# Patient Record
Sex: Male | Born: 1960 | Race: White | Hispanic: Yes | Marital: Married | Smoking: Never smoker
Health system: Southern US, Community
[De-identification: ages and names within clinical notes are randomized; demographics above are authoritative.]

## PROBLEM LIST (undated history)

## (undated) DIAGNOSIS — T7840XA Allergy, unspecified, initial encounter: Secondary | ICD-10-CM

## (undated) DIAGNOSIS — R42 Dizziness and giddiness: Secondary | ICD-10-CM

## (undated) DIAGNOSIS — I82409 Acute embolism and thrombosis of unspecified deep veins of unspecified lower extremity: Secondary | ICD-10-CM

## (undated) DIAGNOSIS — R002 Palpitations: Secondary | ICD-10-CM

## (undated) HISTORY — DX: Palpitations: R00.2

## (undated) HISTORY — DX: Allergy, unspecified, initial encounter: T78.40XA

## (undated) HISTORY — DX: Dizziness and giddiness: R42

## (undated) HISTORY — DX: Acute embolism and thrombosis of unspecified deep veins of unspecified lower extremity: I82.409

---

## 2012-10-25 HISTORY — PX: POLYPECTOMY: SHX149

## 2015-10-14 ENCOUNTER — Encounter: Payer: Self-pay | Admitting: Physician Assistant

## 2015-10-14 ENCOUNTER — Ambulatory Visit: Payer: Self-pay | Admitting: Physician Assistant

## 2015-10-14 VITALS — BP 118/76 | HR 56 | Temp 97.7°F | Ht 69.0 in | Wt 163.0 lb

## 2015-10-14 DIAGNOSIS — R002 Palpitations: Secondary | ICD-10-CM

## 2015-10-14 DIAGNOSIS — Z131 Encounter for screening for diabetes mellitus: Secondary | ICD-10-CM

## 2015-10-14 DIAGNOSIS — Z125 Encounter for screening for malignant neoplasm of prostate: Secondary | ICD-10-CM

## 2015-10-14 DIAGNOSIS — Z13220 Encounter for screening for lipoid disorders: Secondary | ICD-10-CM

## 2015-10-14 LAB — GLUCOSE, POCT (MANUAL RESULT ENTRY): POC Glucose: 91 mg/dl (ref 70–99)

## 2015-10-14 NOTE — Progress Notes (Signed)
BP 118/76 (BP Location: Left Arm, Patient Position: Sitting, Cuff Size: Normal)   Pulse (!) 56   Temp 97.7 F (36.5 C)   Ht 5\' 9"  (1.753 m)   Wt 163 lb (73.9 kg)   SpO2 97%   BMI 24.07 kg/m    Subjective:    Patient ID: Logan Bush, male    DOB: [birth year], 56 y.o.   MRN: [id number]  HPI: Logan Bush is a 55 y.o. male presenting on 10/14/2015 for New Patient (Initial Visit) (pt arrived from [place] 6 months)   HPI   Pt recently moved from [place]. Moved due to the stress.  Originally from [place].  Pt says he had on Oct 2014- colonoscopy with polypectomy at [facility].    Pt c/o palpitations.  once at 2am last week and other was about 4 months ago.  Says lasted about 30 minutes.  No CP just felt somewhat anxious.  Relevant past medical, surgical, family and social history reviewed and updated as indicated. Interim medical history since our last visit reviewed. Allergies and medications reviewed and updated.   Current Outpatient Prescriptions:  .  Omega-3 Fatty Acids (FISH OIL PO), Take 1-4 capsules by mouth daily., Disp: , Rfl:  .  Probiotic Product (PROBIOTIC PO), Take 2 capsules by mouth daily., Disp: , Rfl:  .  Psyllium (METAMUCIL FIBER PO), Take by mouth., Disp: , Rfl:    Review of Systems  Constitutional: Positive for fatigue. Negative for appetite change, chills, diaphoresis, fever and unexpected weight change.  HENT: Positive for dental problem and sneezing. Negative for congestion, drooling, ear pain, facial swelling, hearing loss, mouth sores, sore throat, trouble swallowing and voice change.   Eyes: Negative for pain, discharge, redness, itching and visual disturbance.  Respiratory: Negative for cough, choking, shortness of breath and wheezing.   Cardiovascular: Positive for palpitations. Negative for chest pain and leg swelling.  Gastrointestinal: Positive for diarrhea. Negative for abdominal pain, blood in stool, constipation and vomiting.  Endocrine: Positive  for heat intolerance. Negative for cold intolerance and polydipsia.  Genitourinary: Negative for decreased urine volume, dysuria and hematuria.  Musculoskeletal: Positive for arthralgias, back pain and gait problem.  Skin: Negative for rash.  Allergic/Immunologic: Positive for environmental allergies.  Neurological: Positive for headaches. Negative for seizures, syncope and light-headedness.  Hematological: Negative for adenopathy.  Psychiatric/Behavioral: Positive for agitation. Negative for dysphoric mood and suicidal ideas. The patient is not nervous/anxious.     Per HPI unless specifically indicated above     Objective:    BP 118/76 (BP Location: Left Arm, Patient Position: Sitting, Cuff Size: Normal)   Pulse (!) 56   Temp 97.7 F (36.5 C)   Ht 5\' 9"  (1.753 m)   Wt 163 lb (73.9 kg)   SpO2 97%   BMI 24.07 kg/m   Wt Readings from Last 3 Encounters:  10/14/15 163 lb (73.9 kg)    Physical Exam  Constitutional: He is oriented to person, place, and time. He appears well-developed and well-nourished.  HENT:  Head: Normocephalic and atraumatic.  Mouth/Throat: Oropharynx is clear and moist. No oropharyngeal exudate.  Eyes: Conjunctivae and EOM are normal. Pupils are equal, round, and reactive to light.  Neck: Neck supple. No thyromegaly present.  Cardiovascular: Normal rate and regular rhythm.   Pulmonary/Chest: Effort normal and breath sounds normal. He has no wheezes. He has no rales.  Abdominal: Soft. Bowel sounds are normal. He exhibits no mass. There is no hepatosplenomegaly. There is no tenderness.  Musculoskeletal:  He exhibits no edema.  Lymphadenopathy:    He has no cervical adenopathy.  Neurological: He is alert and oriented to person, place, and time.  Skin: Skin is warm and dry. No rash noted.  Psychiatric: He has a normal mood and affect. His behavior is normal. Thought content normal.  Vitals reviewed.    EKG- NSR at 57 bpm- no st-t changes. No previous for  comparison   Results for orders placed or performed in visit on 10/14/15  POCT Glucose (CBG)  Result Value Ref Range   POC Glucose 91 70 - 99 mg/dl      Assessment & Plan:   Encounter Diagnoses  Name Primary?  . Palpitations Yes  . Screening for diabetes mellitus (DM)   . Screening cholesterol level   . Screening for prostate cancer      -Get baseline labs -[facility] -pt counseled to avoid caffeine -F/u 1 month.  RTO sooner prn

## 2015-10-16 LAB — CBC WITH DIFFERENTIAL/PLATELET
BASOS PCT: 1 %
Basophils Absolute: 37 cells/uL (ref 0–200)
Eosinophils Absolute: 222 cells/uL (ref 15–500)
Eosinophils Relative: 6 %
HCT: 40.8 % (ref 38.5–50.0)
Hemoglobin: 13.9 g/dL (ref 13.2–17.1)
LYMPHS PCT: 37 %
Lymphs Abs: 1369 cells/uL (ref 850–3900)
MCH: 30.5 pg (ref 27.0–33.0)
MCHC: 34.1 g/dL (ref 32.0–36.0)
MCV: 89.5 fL (ref 80.0–100.0)
MONO ABS: 407 {cells}/uL (ref 200–950)
MONOS PCT: 11 %
MPV: 10.3 fL (ref 7.5–12.5)
Neutro Abs: 1665 cells/uL (ref 1500–7800)
Neutrophils Relative %: 45 %
PLATELETS: 210 10*3/uL (ref 140–400)
RBC: 4.56 MIL/uL (ref 4.20–5.80)
RDW: 13.5 % (ref 11.0–15.0)
WBC: 3.7 10*3/uL — AB (ref 3.8–10.8)

## 2015-10-16 LAB — LIPID PANEL
CHOLESTEROL: 183 mg/dL (ref 125–200)
HDL: 65 mg/dL (ref >=40)
LDL Cholesterol: 108 mg/dL (ref <130)
TRIGLYCERIDES: 52 mg/dL (ref <150)
Total CHOL/HDL Ratio: 2.8 Ratio (ref <=5.0)
VLDL: 10 mg/dL (ref <30)

## 2015-10-16 LAB — PSA: PSA: 0.5 ng/mL (ref <=4.0)

## 2015-10-16 LAB — COMPREHENSIVE METABOLIC PANEL
ALBUMIN: 4.3 g/dL (ref 3.6–5.1)
ALT: 14 U/L (ref 9–46)
AST: 14 U/L (ref 10–35)
Alkaline Phosphatase: 52 U/L (ref 40–115)
BILIRUBIN TOTAL: 0.9 mg/dL (ref 0.2–1.2)
BUN: 16 mg/dL (ref 7–25)
CHLORIDE: 105 mmol/L (ref 98–110)
CO2: 29 mmol/L (ref 20–31)
CREATININE: 0.97 mg/dL (ref 0.70–1.33)
Calcium: 9 mg/dL (ref 8.6–10.3)
Glucose, Bld: 92 mg/dL (ref 65–99)
Potassium: 4.6 mmol/L (ref 3.5–5.3)
SODIUM: 141 mmol/L (ref 135–146)
TOTAL PROTEIN: 6.4 g/dL (ref 6.1–8.1)

## 2015-10-16 LAB — TSH: TSH: 0.71 mIU/L (ref 0.40–4.50)

## 2015-10-17 LAB — HEMOGLOBIN A1C
Hgb A1c MFr Bld: 5.2 % (ref <5.7)
MEAN PLASMA GLUCOSE: 103 mg/dL

## 2015-10-27 ENCOUNTER — Ambulatory Visit: Payer: Self-pay

## 2015-11-11 ENCOUNTER — Ambulatory Visit: Payer: Self-pay | Admitting: Physician Assistant

## 2015-11-11 ENCOUNTER — Encounter: Payer: Self-pay | Admitting: Physician Assistant

## 2015-11-11 VITALS — BP 126/88 | HR 52 | Temp 97.9°F | Ht 69.0 in | Wt 164.0 lb

## 2015-11-11 DIAGNOSIS — R0602 Shortness of breath: Secondary | ICD-10-CM

## 2015-11-11 NOTE — Progress Notes (Signed)
BP 126/88 (BP Location: Left Arm, Patient Position: Sitting, Cuff Size: Normal)   Pulse (!) 52   Temp 97.9 F (36.6 C)   Ht _0  (1.753 m)   Wt 164 lb (74.4 kg)   SpO2 98%   BMI 24.22 kg/m    Subjective:    Patient ID: Logan Bush, male    DOB: [birth year], 55 y.o.   MRN: [id number]  HPI: Logan Bush is a 54 y.o. male presenting on 11/11/2015 for Follow-up   HPI   Pt didn't cut out caffeine after last OV as recommended.   Pt c/o sometimes he gets cp and sometimes he gets sob.  He gets sob this when he is exerting himself.   He gets chest pain at rest.  It usually lasts about 2 minutes. In march he had an episode that lasted 30 minutes.  He got dizzy with that episode also.    When he stands up he feels like his heart is beating faster.   He also says he gets sob and cp together.  And when he lays down he feels better.  Somewhat difficult historian even with a Optometrist.   He gets sob when he climbs stairs but says he doesn't get cp.  The pain he gets when he is sitting at rest is sharp and lasts a couple of seconds, like 30-40 seconds- these are the stronger pains. The other ones are lasting for several minutes as above.   Pt plays soccer one time each month for 1 1/2 hours.  He gets no sob or chest pains.  He only gets pains in his knees.  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  He says he does get sob when he has to bend over and pick up the ball and throw it.    Pt worried b/c his mother died from lack of oxygen to the brain.  He admits on questioning she had a stroke at age 4.   Relevant past medical, surgical, family and social history reviewed and updated as indicated. Interim medical history since our last visit reviewed. Allergies and medications reviewed and updated.   Current Outpatient Prescriptions:  .  Psyllium (METAMUCIL FIBER PO), Take by mouth., Disp: , Rfl:  .  Omega-3 Fatty Acids (FISH OIL PO), Take 1-4 capsules by mouth daily., Disp: ,  Rfl:  .  Probiotic Product (PROBIOTIC PO), Take 2 capsules by mouth daily., Disp: , Rfl:    Review of Systems  Constitutional: Positive for fatigue. Negative for appetite change, chills, diaphoresis, fever and unexpected weight change.  HENT: Positive for dental problem. Negative for congestion, drooling, ear pain, facial swelling, hearing loss, mouth sores, sneezing, sore throat, trouble swallowing and voice change.   Eyes: Positive for itching. Negative for pain, discharge, redness and visual disturbance.  Respiratory: Positive for shortness of breath. Negative for cough, choking and wheezing.   Cardiovascular: Positive for chest pain. Negative for palpitations and leg swelling.  Gastrointestinal: Negative for abdominal pain, blood in stool, constipation, diarrhea and vomiting.  Endocrine: Positive for heat intolerance. Negative for cold intolerance and polydipsia.  Genitourinary: Negative for decreased urine volume, dysuria and hematuria.  Musculoskeletal: Positive for arthralgias, back pain and gait problem.  Skin: Negative for rash.  Allergic/Immunologic: Negative for environmental allergies.  Neurological: Negative for seizures, syncope, light-headedness and headaches.  Hematological: Negative for adenopathy.  Psychiatric/Behavioral: Positive for agitation. Negative for dysphoric mood and suicidal ideas. The patient is nervous/anxious.  Per HPI unless specifically indicated above     Objective:    BP 126/88 (BP Location: Left Arm, Patient Position: Sitting, Cuff Size: Normal)   Pulse (!) 52   Temp 97.9 F (36.6 C)   Ht _0  (1.753 m)   Wt 164 lb (74.4 kg)   SpO2 98%   BMI 24.22 kg/m   Wt Readings from Last 3 Encounters:  11/11/15 164 lb (74.4 kg)  10/14/15 163 lb (73.9 kg)    Physical Exam  Constitutional: He is oriented to person, place, and time. He appears well-developed and well-nourished.  HENT:  Head: Normocephalic and atraumatic.  Neck: Neck supple.   Cardiovascular: Normal rate and regular rhythm.   Pulmonary/Chest: Effort normal and breath sounds normal. He has no wheezes.  Abdominal: Soft. Bowel sounds are normal. There is no hepatosplenomegaly. There is no tenderness.  Musculoskeletal: He exhibits no edema.  Lymphadenopathy:    He has no cervical adenopathy.  Neurological: He is alert and oriented to person, place, and time.  Skin: Skin is warm and dry.  Psychiatric: He has a normal mood and affect. His behavior is normal.  Vitals reviewed.   Results for orders placed or performed in visit on 10/14/15  Comprehensive Metabolic Panel (CMET)  Result Value Ref Range   Sodium 141 135 - 146 mmol/L   Potassium 4.6 3.5 - 5.3 mmol/L   Chloride 105 98 - 110 mmol/L   CO2 29 20 - 31 mmol/L   Glucose, Bld 92 65 - 99 mg/dL   BUN 16 7 - 25 mg/dL   Creat 0.97 0.70 - 1.33 mg/dL   Total Bilirubin 0.9 0.2 - 1.2 mg/dL   Alkaline Phosphatase 52 40 - 115 U/L   AST 14 10 - 35 U/L   ALT 14 9 - 46 U/L   Total Protein 6.4 6.1 - 8.1 g/dL   Albumin 4.3 3.6 - 5.1 g/dL   Calcium 9.0 8.6 - 10.3 mg/dL  CBC w/Diff/Platelet  Result Value Ref Range   WBC 3.7 (L) 3.8 - 10.8 K/uL   RBC 4.56 4.20 - 5.80 MIL/uL   Hemoglobin 13.9 13.2 - 17.1 g/dL   HCT 40.8 38.5 - 50.0 %   MCV 89.5 80.0 - 100.0 fL   MCH 30.5 27.0 - 33.0 pg   MCHC 34.1 32.0 - 36.0 g/dL   RDW 13.5 11.0 - 15.0 %   Platelets 210 140 - 400 K/uL   MPV 10.3 7.5 - 12.5 fL   Neutro Abs 1,665 1,500 - 7,800 cells/uL   Lymphs Abs 1,369 850 - 3,900 cells/uL   Monocytes Absolute 407 200 - 950 cells/uL   Eosinophils Absolute 222 15 - 500 cells/uL   Basophils Absolute 37 0 - 200 cells/uL   Neutrophils Relative % 45 %   Lymphocytes Relative 37 %   Monocytes Relative 11 %   Eosinophils Relative 6 %   Basophils Relative 1 %   Smear Review Criteria for review not met   PSA  Result Value Ref Range   PSA 0.5 <=4.0 ng/mL  Lipid Profile  Result Value Ref Range   Cholesterol 183 125 - 200 mg/dL    Triglycerides 52 <150 mg/dL   HDL 65 >=40 mg/dL   Total CHOL/HDL Ratio 2.8 <=5.0 Ratio   VLDL 10 <30 mg/dL   LDL Cholesterol 108 <130 mg/dL  TSH  Result Value Ref Range   TSH 0.71 0.40 - 4.50 mIU/L  HgB A1c  Result Value Ref Range  Hgb A1c MFr Bld 5.2 <5.7 %   Mean Plasma Glucose 103 mg/dL  POCT Glucose (CBG)  Result Value Ref Range   POC Glucose 91 70 - 99 mg/dl      Assessment & Plan:   Encounter Diagnosis  Name Primary?  . SOB (shortness of breath) Yes    -reviewed labs with pt -will Get cxr in light of complaints -Gave [facility] discount application -pt encouraged to Drink plenty fluids -Gradually increase exercise- possible deconditioning -still awaiting records requested after new pt appointment -pt to F/u  3 months.  RTO sooner prn worsening or new symptoms

## 2016-02-11 ENCOUNTER — Ambulatory Visit: Payer: Self-pay | Admitting: Physician Assistant

## 2016-02-18 ENCOUNTER — Ambulatory Visit: Payer: Self-pay | Admitting: Physician Assistant

## 2016-02-19 ENCOUNTER — Encounter: Payer: Self-pay | Admitting: Physician Assistant

## 2016-09-15 ENCOUNTER — Ambulatory Visit: Payer: Self-pay | Admitting: Physician Assistant

## 2016-09-16 ENCOUNTER — Ambulatory Visit: Payer: Self-pay | Admitting: Physician Assistant

## 2016-09-16 ENCOUNTER — Encounter: Payer: Self-pay | Admitting: Physician Assistant

## 2016-09-16 VITALS — BP 102/70 | HR 60 | Temp 98.1°F | Ht 69.0 in | Wt 176.8 lb

## 2016-09-16 DIAGNOSIS — J329 Chronic sinusitis, unspecified: Secondary | ICD-10-CM

## 2016-09-16 DIAGNOSIS — R0602 Shortness of breath: Secondary | ICD-10-CM

## 2016-09-16 DIAGNOSIS — K635 Polyp of colon: Secondary | ICD-10-CM | POA: Insufficient documentation

## 2016-09-16 MED ORDER — AMOXICILLIN 500 MG PO CAPS: ORAL_CAPSULE | ORAL | 0 refills | Status: AC

## 2016-09-16 NOTE — Progress Notes (Signed)
BP 102/70 (BP Location: Left Arm, Patient Position: Sitting, Cuff Size: Normal)   Pulse 60   Temp 98.1 F (36.7 C)   Ht '5\' 9"'$  (1.753 m)   Wt 176 lb 12 oz (80.2 kg)   SpO2 97%   BMI 26.10 kg/m    Subjective:    Patient ID: Logan Bush, male    DOB: 24-Nov-1960, 56 y.o.   MRN: [id number]  HPI: Logan Bush is a 56 y.o. male presenting on 09/16/2016 for Epistaxis and Breathing Problem   HPI   Pt never returned to office .  he Was scheduled to follow up dec 2017  Records arrived from [place], [place]- echo 2016 (normal) and colonoscopy 2014 (polyps and hemorrhoids).  Pt's echo was ordered for complaints of SOB.   Pt states nosebleed for several minutes 4 times on  1 1/2 days 3 week ago on the R side.  No bleeding since.  But now feels light-headed when he bends over.   Sometimes he has right sided headache.   Pt says his breathing has been fast- episodes during the day.  States sometimes with exertion and sometimes at rest.   Pt was recommended to get cxr when here in October but he didn't get it done.   Relevant past medical, surgical, family and social history reviewed and updated as indicated. Interim medical history since our last visit reviewed. Allergies and medications reviewed and updated.    Current Outpatient Prescriptions:  .  Psyllium (METAMUCIL FIBER PO), Take by mouth every other day. , Disp: , Rfl:   Review of Systems  Constitutional: Positive for appetite change, diaphoresis and fatigue. Negative for chills, fever and unexpected weight change.  HENT: Positive for dental problem. Negative for congestion, drooling, ear pain, facial swelling, hearing loss, mouth sores, sneezing, sore throat, trouble swallowing and voice change.   Eyes: Positive for pain and itching. Negative for discharge, redness and visual disturbance.  Respiratory: Positive for choking, chest tightness and shortness of breath. Negative for cough and wheezing.   Cardiovascular: Positive for palpitations and  leg swelling. Negative for chest pain.  Gastrointestinal: Positive for abdominal pain. Negative for blood in stool, constipation, diarrhea and vomiting.  Endocrine: Negative for cold intolerance, heat intolerance and polydipsia.  Genitourinary: Negative for decreased urine volume, dysuria and hematuria.  Musculoskeletal: Positive for arthralgias, back pain and gait problem.  Skin: Negative for rash.  Allergic/Immunologic: Negative for environmental allergies.  Neurological: Positive for light-headedness. Negative for seizures, syncope and headaches.  Hematological: Negative for adenopathy.  Psychiatric/Behavioral: Positive for agitation and dysphoric mood. Negative for suicidal ideas. The patient is nervous/anxious.     Per HPI unless specifically indicated above     Objective:    BP 102/70 (BP Location: Left Arm, Patient Position: Sitting, Cuff Size: Normal)   Pulse 60   Temp 98.1 F (36.7 C)   Ht '5\' 9"'$  (1.753 m)   Wt 176 lb 12 oz (80.2 kg)   SpO2 97%   BMI 26.10 kg/m   Wt Readings from Last 3 Encounters:  09/16/16 176 lb 12 oz (80.2 kg)  11/11/15 164 lb (74.4 kg)  10/14/15 163 lb (73.9 kg)    Physical Exam  Constitutional: He is oriented to person, place, and time. He appears well-developed and well-nourished.  HENT:  Head: Normocephalic and atraumatic.  Right Ear: Hearing, tympanic membrane, external ear and ear canal normal.  Left Ear: Hearing, tympanic membrane and external ear normal. A foreign body (cerumen) is present.  Nose:  Nose normal.  Mouth/Throat: Uvula is midline and oropharynx is clear and moist. No uvula swelling. No oropharyngeal exudate, posterior oropharyngeal edema, posterior oropharyngeal erythema or tonsillar abscesses.  No blood or clot in nose  Neck: Neck supple.  Cardiovascular: Normal rate and regular rhythm.   Pulmonary/Chest: Effort normal and breath sounds normal. He has no wheezes.  Abdominal: Soft. Bowel sounds are normal. There is no  hepatosplenomegaly. There is no tenderness.  Musculoskeletal: He exhibits no edema.  Lymphadenopathy:    He has no cervical adenopathy.  Neurological: He is alert and oriented to person, place, and time.  Skin: Skin is warm and dry.  Psychiatric: He has a normal mood and affect. His behavior is normal.  Vitals reviewed.      Results for orders placed or performed in visit on 10/14/15  Comprehensive Metabolic Panel (CMET)  Result Value Ref Range   Sodium 141 135 - 146 mmol/L   Potassium 4.6 3.5 - 5.3 mmol/L   Chloride 105 98 - 110 mmol/L   CO2 29 20 - 31 mmol/L   Glucose, Bld 92 65 - 99 mg/dL   BUN 16 7 - 25 mg/dL   Creat 0.97 0.70 - 1.33 mg/dL   Total Bilirubin 0.9 0.2 - 1.2 mg/dL   Alkaline Phosphatase 52 40 - 115 U/L   AST 14 10 - 35 U/L   ALT 14 9 - 46 U/L   Total Protein 6.4 6.1 - 8.1 g/dL   Albumin 4.3 3.6 - 5.1 g/dL   Calcium 9.0 8.6 - 10.3 mg/dL  CBC w/Diff/Platelet  Result Value Ref Range   WBC 3.7 (L) 3.8 - 10.8 K/uL   RBC 4.56 4.20 - 5.80 MIL/uL   Hemoglobin 13.9 13.2 - 17.1 g/dL   HCT 40.8 38.5 - 50.0 %   MCV 89.5 80.0 - 100.0 fL   MCH 30.5 27.0 - 33.0 pg   MCHC 34.1 32.0 - 36.0 g/dL   RDW 13.5 11.0 - 15.0 %   Platelets 210 140 - 400 K/uL   MPV 10.3 7.5 - 12.5 fL   Neutro Abs 1,665 1,500 - 7,800 cells/uL   Lymphs Abs 1,369 850 - 3,900 cells/uL   Monocytes Absolute 407 200 - 950 cells/uL   Eosinophils Absolute 222 15 - 500 cells/uL   Basophils Absolute 37 0 - 200 cells/uL   Neutrophils Relative % 45 %   Lymphocytes Relative 37 %   Monocytes Relative 11 %   Eosinophils Relative 6 %   Basophils Relative 1 %   Smear Review Criteria for review not met   PSA  Result Value Ref Range   PSA 0.5 <=4.0 ng/mL  Lipid Profile  Result Value Ref Range   Cholesterol 183 125 - 200 mg/dL   Triglycerides 52 <150 mg/dL   HDL 65 >=40 mg/dL   Total CHOL/HDL Ratio 2.8 <=5.0 Ratio   VLDL 10 <30 mg/dL   LDL Cholesterol 108 <130 mg/dL  TSH  Result Value Ref Range    TSH 0.71 0.40 - 4.50 mIU/L  HgB A1c  Result Value Ref Range   Hgb A1c MFr Bld 5.2 <5.7 %   Mean Plasma Glucose 103 mg/dL  POCT Glucose (CBG)  Result Value Ref Range   POC Glucose 91 70 - 99 mg/dl      Assessment & Plan:   Encounter Diagnoses  Name Primary?  . SOB (shortness of breath) Yes  . Sinusitis, unspecified chronicity, unspecified location   . Polyp of colon, unspecified part of  colon, unspecified type      -Pt due for colonoscopy 2019 due to polyps -rx amoxil for sinusisit -again recommend cxr for pt's longstanding complaint of SBO -Gave [facility] discount application for the cxr -pt to follow up one month. RTO sooner prn

## 2016-10-18 ENCOUNTER — Ambulatory Visit: Payer: Self-pay | Admitting: Physician Assistant

## 2016-10-28 ENCOUNTER — Ambulatory Visit: Payer: Self-pay | Admitting: Physician Assistant

## 2016-11-02 ENCOUNTER — Ambulatory Visit: Payer: Self-pay | Admitting: Physician Assistant

## 2016-11-08 ENCOUNTER — Ambulatory Visit (HOSPITAL_COMMUNITY)
Admission: RE | Admit: 2016-11-08 | Discharge: 2016-11-08 | Disposition: A | Discharge status: Home / Self Care | Payer: Self-pay | Source: Ambulatory Visit | Attending: Physician Assistant | Admitting: Physician Assistant

## 2016-11-08 DIAGNOSIS — R0602 Shortness of breath: Secondary | ICD-10-CM | POA: Insufficient documentation

## 2016-11-08 IMAGING — DX DG CHEST 2V
2 series · 2 of 2 positions shown · non-contrast
Comparison: None.

CLINICAL DATA: Chest pain

EXAM:
CHEST  2 VIEW

[chest pa]
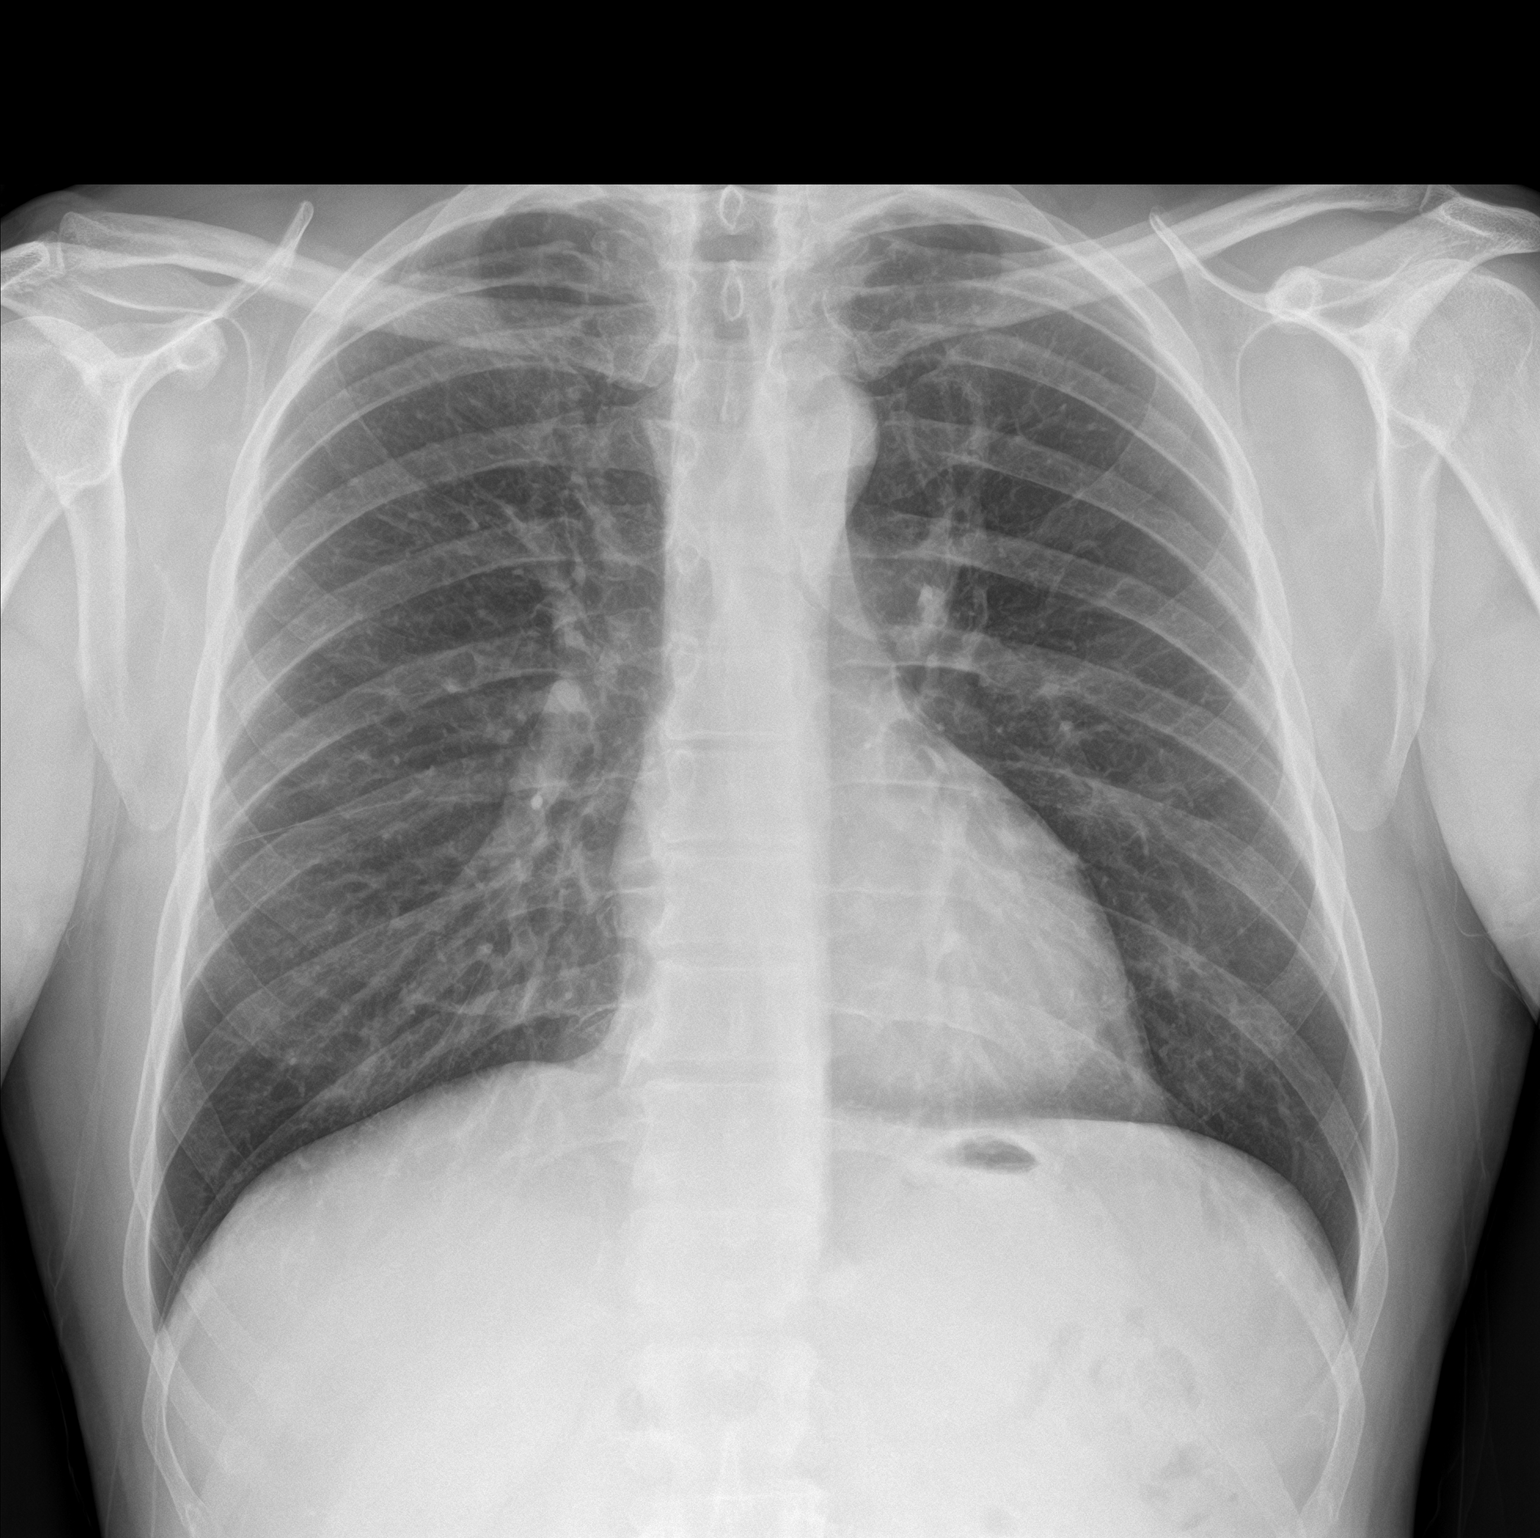

[chest lat]
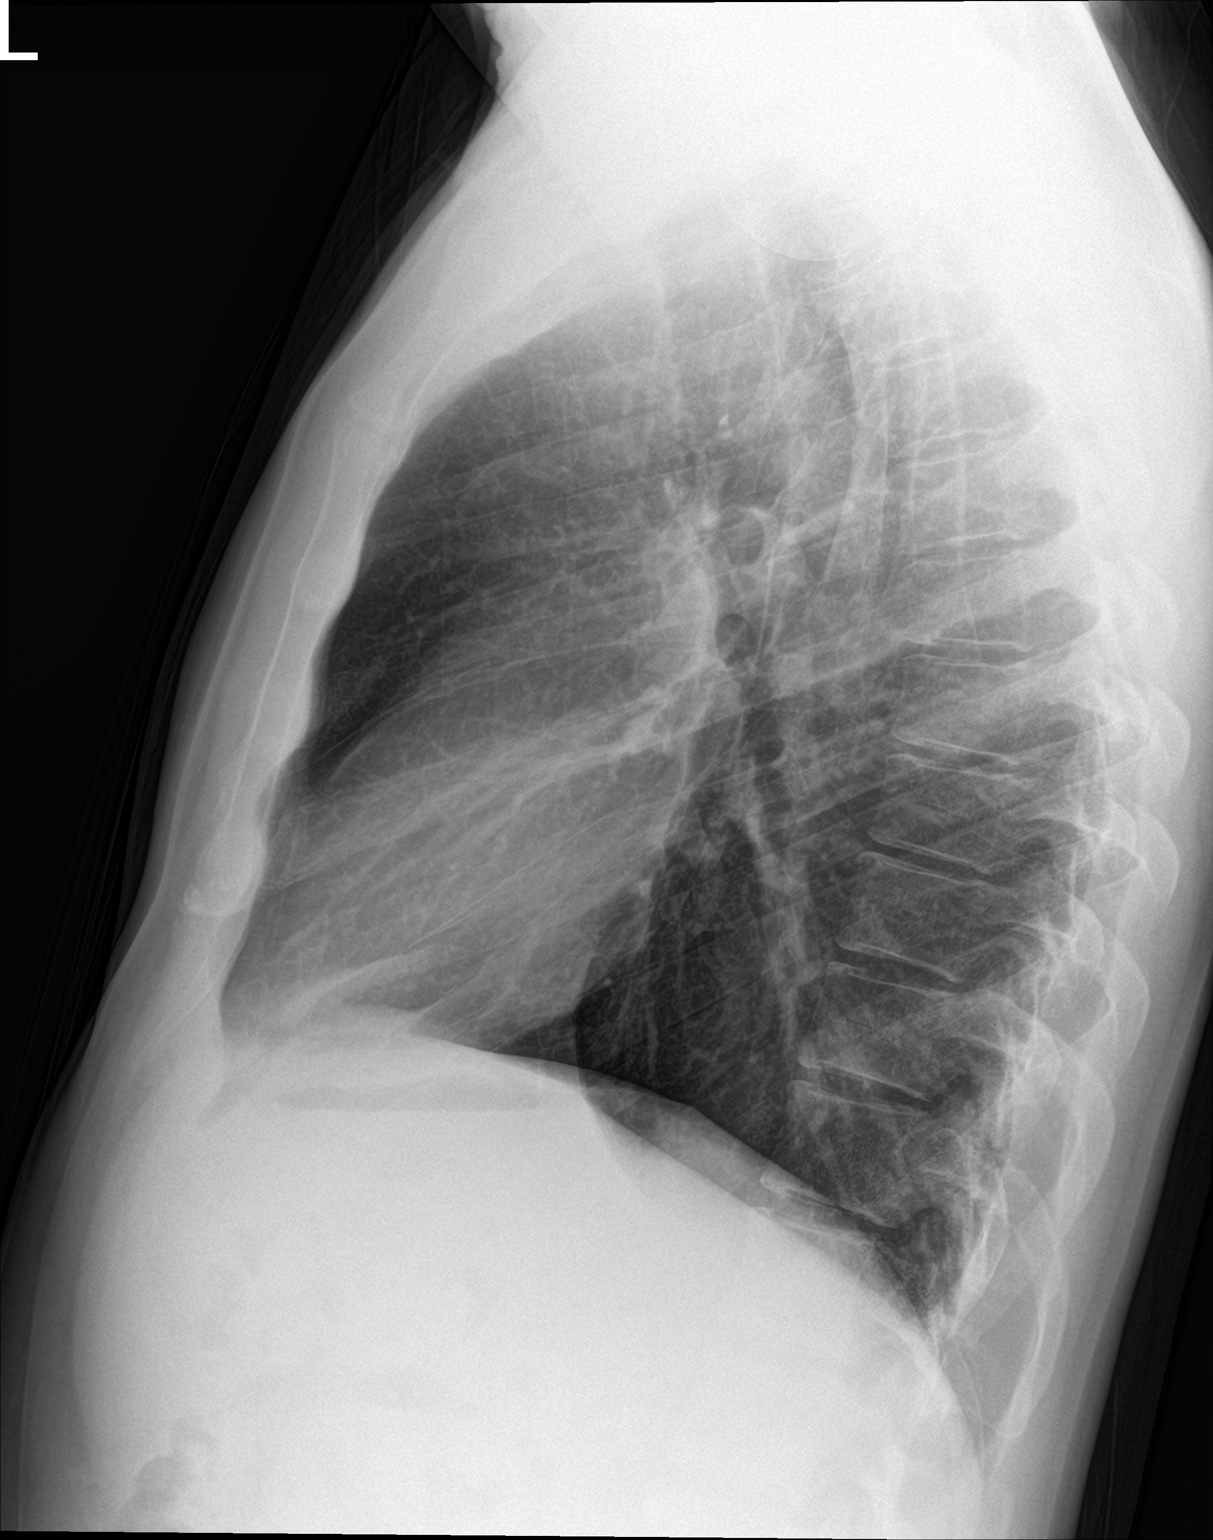

[2 of 2 positions shown; findings below may reference images not displayed]

FINDINGS: Lungs are clear. Heart size and pulmonary vascularity are normal. No
adenopathy. No pneumothorax. No bone lesions.
IMPRESSION: No edema or consolidation.

## 2016-11-09 ENCOUNTER — Ambulatory Visit: Payer: Self-pay | Admitting: Physician Assistant

## 2016-11-18 ENCOUNTER — Ambulatory Visit: Payer: Self-pay | Admitting: Physician Assistant

## 2016-11-18 ENCOUNTER — Encounter: Payer: Self-pay | Admitting: Physician Assistant

## 2016-11-18 VITALS — BP 120/86 | HR 56 | Temp 97.9°F | Ht 69.0 in | Wt 181.0 lb

## 2016-11-18 DIAGNOSIS — R0602 Shortness of breath: Secondary | ICD-10-CM

## 2016-11-18 DIAGNOSIS — Z125 Encounter for screening for malignant neoplasm of prostate: Secondary | ICD-10-CM

## 2016-11-18 DIAGNOSIS — M25562 Pain in left knee: Secondary | ICD-10-CM

## 2016-11-18 DIAGNOSIS — R002 Palpitations: Secondary | ICD-10-CM

## 2016-11-18 MED ORDER — METOPROLOL TARTRATE 25 MG PO TABS: ORAL_TABLET | ORAL | 1 refills | Status: DC

## 2016-11-18 NOTE — Patient Instructions (Signed)
Palpitaciones (Palpitations) Es la sensacin de sentir que el latido cardaco es irregular o es ms rpido que lo normal. Puede sentir como un aleteo o que falta un latido. Generalmente no es un problema grave. Las causas de las palpitaciones pueden ser diversas, entre ellas, el estrs y consumo de cigarrillos, cafena, alcohol y determinados medicamentos. Si bien la Harley-Davidsonmayora de las causas de las palpitaciones no son graves, estas pueden ser un signo de un problema mdico grave. En algunos casos, podra ser necesario hacer ms estudios. INSTRUCCIONES PARA EL CUIDADO EN EL HOGAR Est atento a cualquier cambio en los sntomas. Tome estas medidas para controlar la afeccin:  Evite consumir lo siguiente: ? Advice workerBebidas que contengan cafena como el caf, el t, los refrescos, las pastillas para Geophysical data processoradelgazar y las bebidas energizantes. ? Chocolate. ? Alcohol.  No consuma ningn producto que contenga tabaco, lo que incluye cigarrillos, tabaco de Theatre managermascar y Administrator, Civil Servicecigarrillos electrnicos. Si necesita ayuda para dejar de fumar, consulte al American Expressmdico.  Trate de reducir los niveles de estrs y Commerceansiedad. Las siguientes actividades pueden ayudarlo a relajarse: ? Practicar yoga. ? Meditacin. ? Actividad fsica como natacin, trote o caminatas. ? Biorretroalimentacin. Este es un mtodo que le ensea a usar la mente para Chief Operating Officercontrolar cosas del cuerpo, como los latidos del corazn.  Descanse y duerma lo suficiente.  Tome los medicamentos de venta libre y los recetados solamente como se lo haya indicado el mdico.  OceanographerConcurra a todas las visitas de control como se lo haya indicado el mdico. Esto es importante. SOLICITE ATENCIN MDICA SI:  Contina con latidos cardacos rpidos o irregulares despus de 24 horas.  Las Smith Internationalpalpitaciones le suceden con ms frecuencia.  SOLICITE ATENCIN MDICA DE INMEDIATO SI:  Siente falta de aire o dolor en el pecho.  Tiene un dolor de cabeza intenso.  Se siente mareado o se  desmaya.  Esta informacin no tiene Theme park managercomo fin reemplazar el consejo del mdico. Asegrese de hacerle al mdico cualquier pregunta que tenga. Document Released: 10/21/2004 Document Revised: 05/05/2015 Document Reviewed: 09/26/2014 Elsevier Interactive Patient Education  2017 [employer]

## 2016-11-18 NOTE — Progress Notes (Signed)
BP 120/86 (BP Location: Left Arm, Patient Position: Sitting, Cuff Size: Normal)   Pulse (!) 56   Temp 97.9 F (36.6 C)   Ht 5\' 9"  (1.753 m)   Wt 181 lb (82.1 kg)   SpO2 98%   BMI 26.73 kg/m    Subjective:    Patient ID: Logan Bush, male    DOB: 1960/07/06, 56 y.o.   MRN: [id number]  HPI: Logan Bush is a 56 y.o. male presenting on 11/18/2016 for Breathing Problem (pt states when he feels "fatigued" he feels like his breathing accelerates. ) and Knee Pain (L knee)   HPI   Pt was due to follow up in September and he cancelled and rescheduled and cancelled several appointments since that time.  He has history of not following up as scheduled.  Pt finally got his cxr done and it was normal.  Reviewed with pt.   Pt complains of L knee pain that started several months ago.  He says it hurts all the time.  He says it pops a lot and sometimes swelling.  He used play soccer a lot.    Pt complains of palpitations still.  He drinks a lot of coffee and tea.  Pt has had evaluation to include labwork, ekg, echo, cxr- all normal exept for dialate R ventricle on echo. .   Relevant past medical, surgical, family and social history reviewed and updated as indicated. Interim medical history since our last visit reviewed. Allergies and medications reviewed and updated.   Current Outpatient Prescriptions:  Marland Kitchen  Glucosamine HCl (GLUCOSAMINE PO), Take 2 tablets by mouth daily., Disp: , Rfl:  .  MAGNESIUM PO, Take 1 tablet by mouth daily., Disp: , Rfl:  .  Omega-3 Fatty Acids (FISH OIL PO), Take 3 tablets by mouth daily., Disp: , Rfl:  .  Psyllium (METAMUCIL FIBER PO), Take by mouth every other day. , Disp: , Rfl:    Review of Systems  Constitutional: Positive for fatigue. Negative for appetite change, chills, diaphoresis, fever and unexpected weight change.  HENT: Positive for dental problem. Negative for congestion, drooling, ear pain, facial swelling, hearing loss, mouth sores, sneezing, sore  throat, trouble swallowing and voice change.   Eyes: Positive for itching. Negative for pain, discharge, redness and visual disturbance.  Respiratory: Positive for chest tightness and shortness of breath. Negative for cough, choking and wheezing.   Cardiovascular: Positive for palpitations. Negative for chest pain and leg swelling.  Gastrointestinal: Negative for abdominal pain, blood in stool, constipation, diarrhea and vomiting.  Endocrine: Positive for cold intolerance. Negative for heat intolerance and polydipsia.  Genitourinary: Negative for decreased urine volume, dysuria and hematuria.  Musculoskeletal: Positive for arthralgias, back pain and gait problem.  Skin: Negative for rash.  Allergic/Immunologic: Positive for environmental allergies.  Neurological: Negative for seizures, syncope, light-headedness and headaches.  Hematological: Negative for adenopathy.  Psychiatric/Behavioral: Positive for agitation. Negative for dysphoric mood and suicidal ideas. The patient is not nervous/anxious.     Per HPI unless specifically indicated above     Objective:    BP 120/86 (BP Location: Left Arm, Patient Position: Sitting, Cuff Size: Normal)   Pulse (!) 56   Temp 97.9 F (36.6 C)   Ht 5\' 9"  (1.753 m)   Wt 181 lb (82.1 kg)   SpO2 98%   BMI 26.73 kg/m   Wt Readings from Last 3 Encounters:  11/18/16 181 lb (82.1 kg)  09/16/16 176 lb 12 oz (80.2 kg)  11/11/15 164 lb (74.4  kg)    Physical Exam  Constitutional: He is oriented to person, place, and time. He appears well-developed and well-nourished.  HENT:  Head: Normocephalic and atraumatic.  Neck: Neck supple.  Cardiovascular: Normal rate and regular rhythm.   Pulmonary/Chest: Effort normal and breath sounds normal. He has no wheezes.  Abdominal: Soft. Bowel sounds are normal. There is no hepatosplenomegaly. There is no tenderness.  Musculoskeletal: He exhibits no edema.       Left knee: He exhibits normal range of motion, no  swelling, no effusion, no ecchymosis, no deformity, no erythema, normal alignment, no LCL laxity, normal patellar mobility, no bony tenderness and no MCL laxity.  + crepitus with ROM  Lymphadenopathy:    He has no cervical adenopathy.  Neurological: He is alert and oriented to person, place, and time.  Skin: Skin is warm and dry.  Psychiatric: He has a normal mood and affect. His behavior is normal.  Vitals reviewed.       Assessment & Plan:    Encounter Diagnoses  Name Primary?  . SOB (shortness of breath) Yes  . Acute pain of left knee   . Palpitations   . Screening for prostate cancer     HCM:  Plan to check Psa before next OV. Colonoscopy in 2019.   Knee pain: xray. Ice. Ace wrap prn. apap or ibu prn. Pt is given another [employer] discount application for the xray  Palpitations: avoid caffeine.  Due to repeated and ongoing complaints will try low-dose beta-blocker.  Do no suspect this low dose will affect his sob since that appears to not be organic.  Pt to follow up in  Weeks to recheck his palpitations.  Pt to RTO sooner prn

## 2016-11-26 ENCOUNTER — Ambulatory Visit (HOSPITAL_COMMUNITY)
Admission: RE | Admit: 2016-11-26 | Discharge: 2016-11-26 | Disposition: A | Discharge status: Home / Self Care | Payer: Self-pay | Source: Ambulatory Visit | Attending: Physician Assistant | Admitting: Physician Assistant

## 2016-11-26 DIAGNOSIS — M25562 Pain in left knee: Secondary | ICD-10-CM | POA: Insufficient documentation

## 2016-11-26 IMAGING — DX DG KNEE COMPLETE 4+V*L*
4 series · 4 of 4 positions shown · non-contrast
Comparison: None.

CLINICAL DATA: Chronic left knee pain for 2 years, increasing
recently. Initial encounter.

EXAM:
LEFT KNEE - COMPLETE 4+ VIEW

[knee ap]
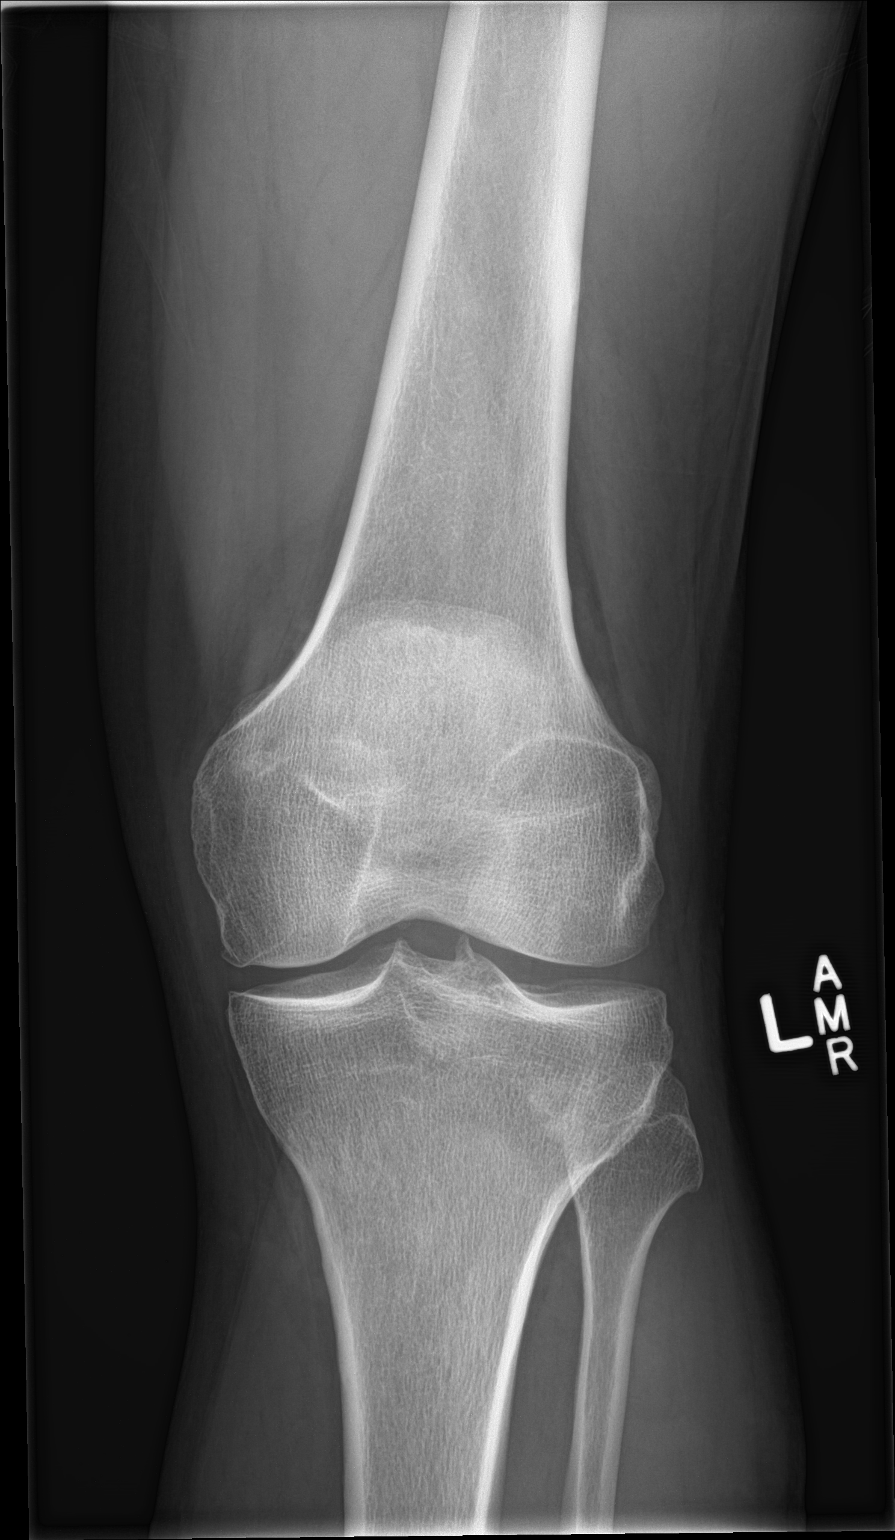

[knee obl (1 of 2)]
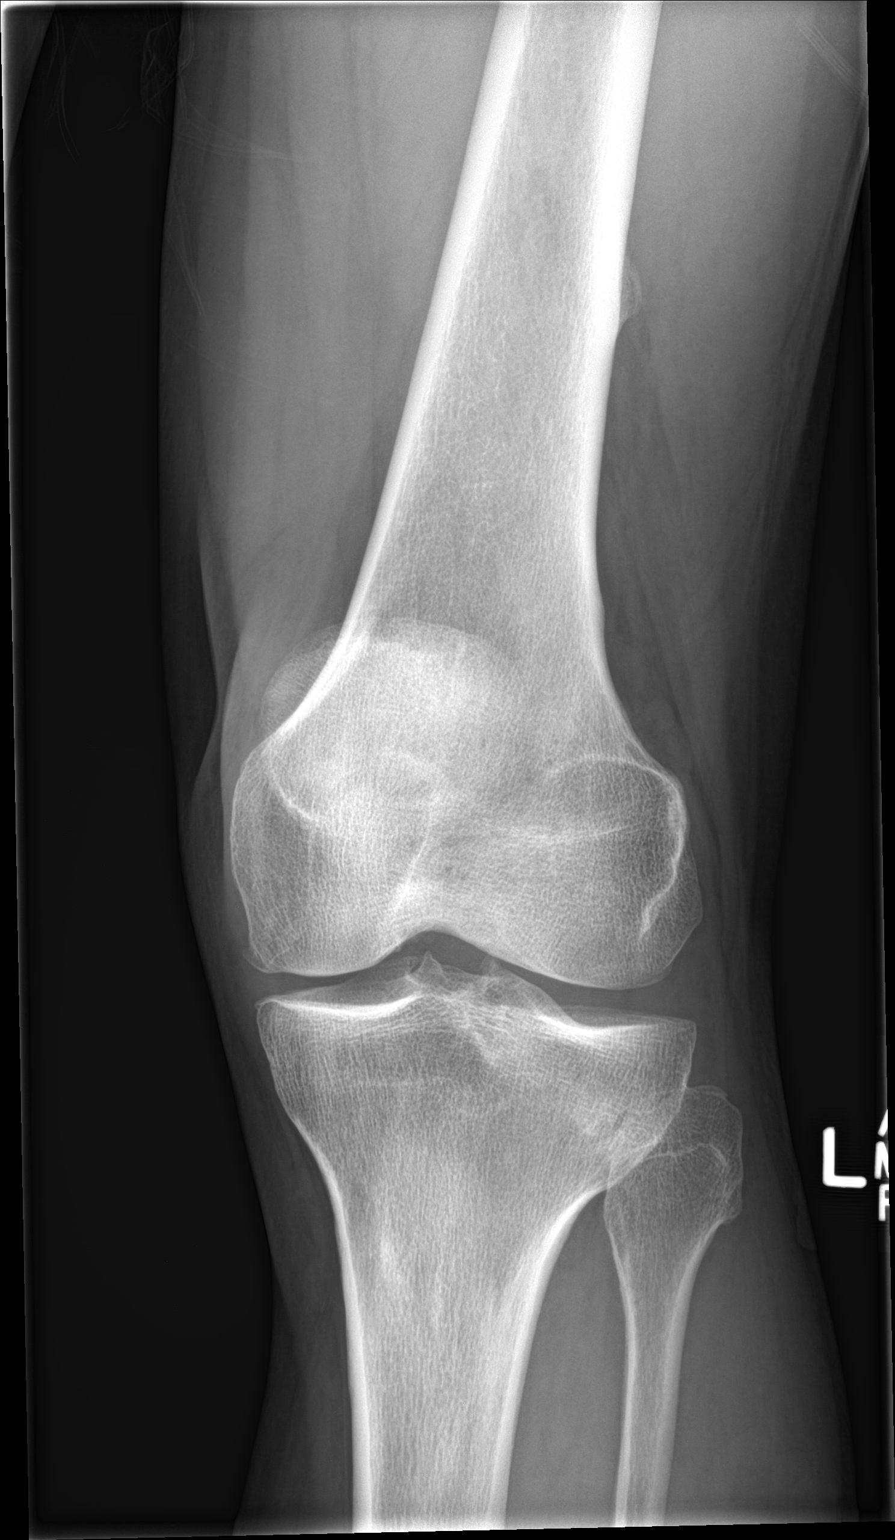

[knee obl (2 of 2)]
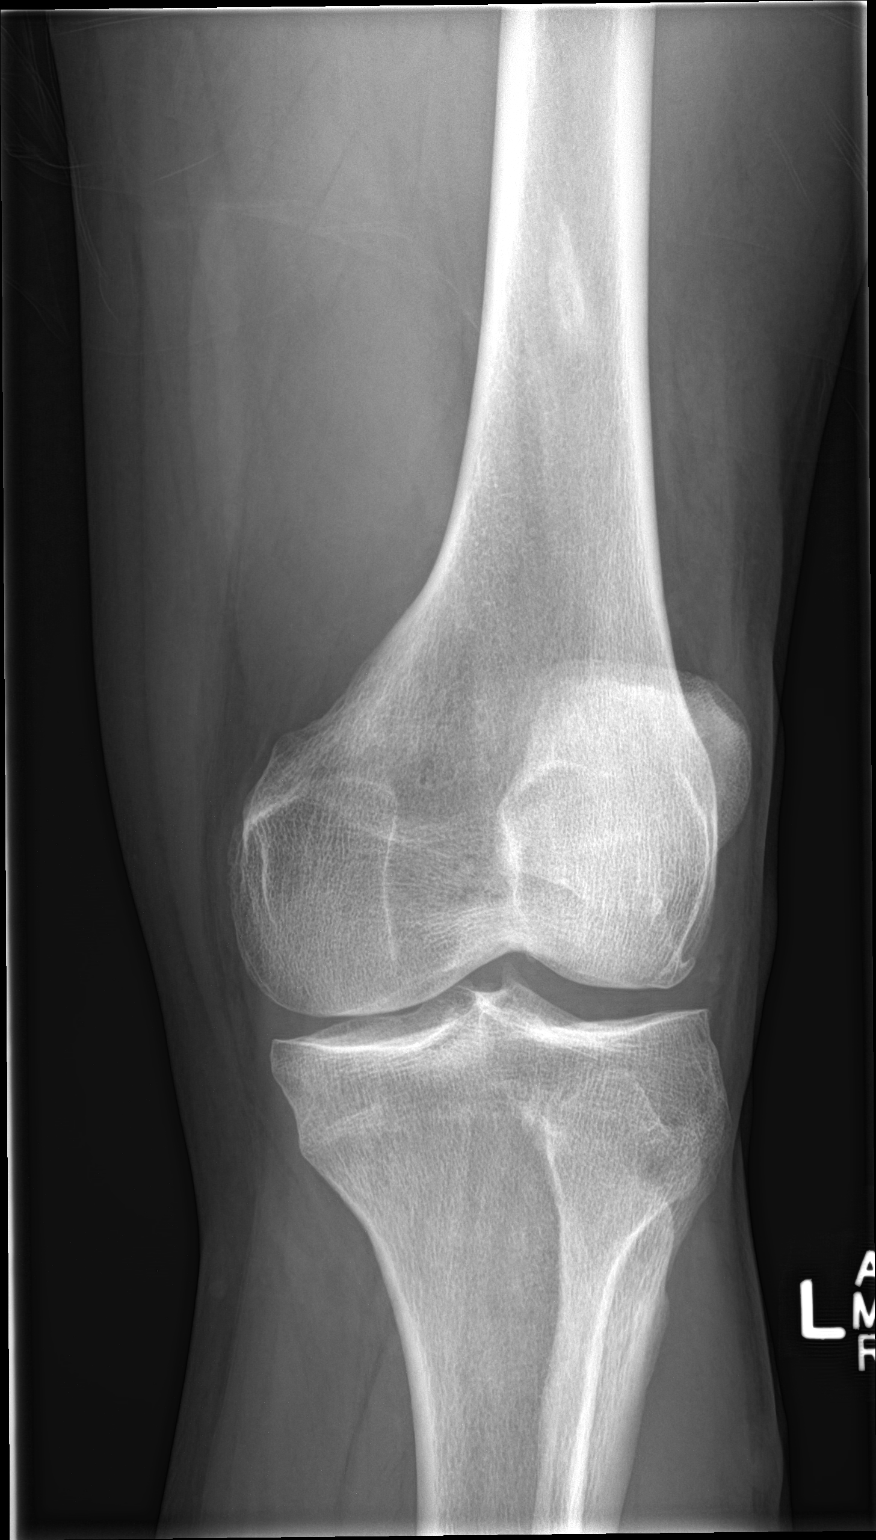

[knee lat]
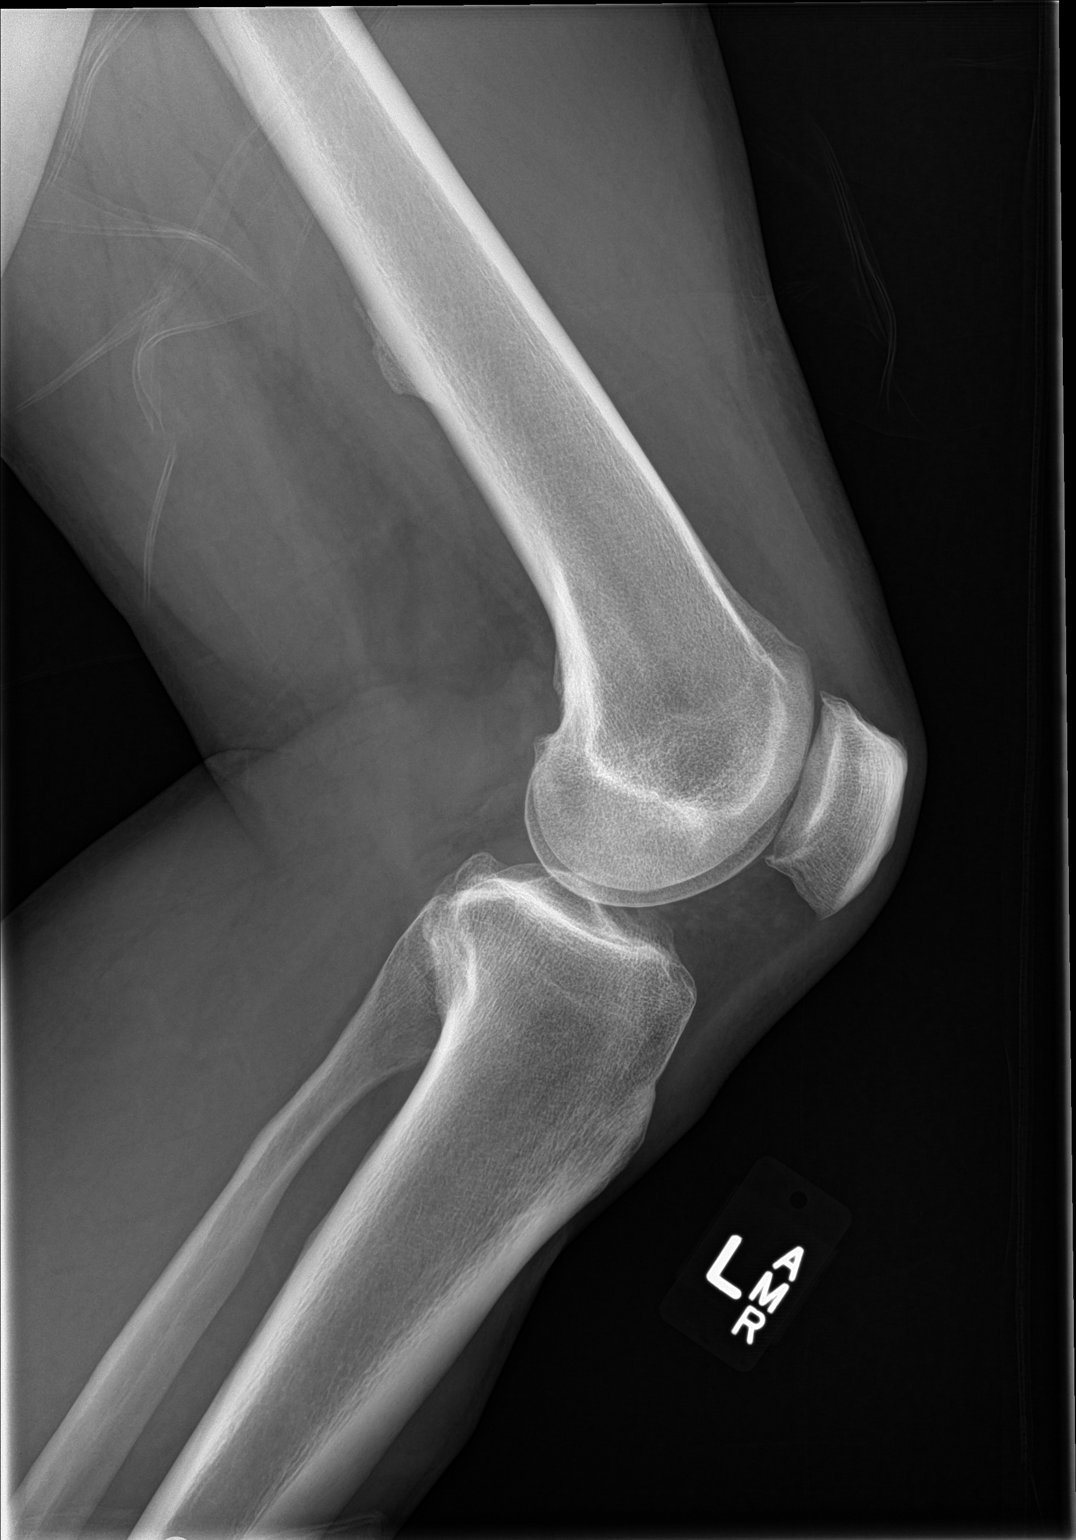

[4 of 4 positions shown; findings below may reference images not displayed]

FINDINGS: There is no evidence of fracture, subluxation or dislocation.

Mild joint space narrowing and osteophytosis in all 3 compartments
noted.

There is no evidence of joint effusion or focal bony lesion.

No other significant abnormalities noted.
IMPRESSION: 1. No evidence of acute abnormality
2. Mild tricompartmental degenerative changes.

## 2016-11-30 ENCOUNTER — Encounter: Payer: Self-pay | Admitting: Physician Assistant

## 2016-11-30 ENCOUNTER — Ambulatory Visit: Payer: Self-pay | Admitting: Physician Assistant

## 2016-11-30 VITALS — BP 110/82 | HR 69 | Temp 97.9°F | Wt 178.5 lb

## 2016-11-30 DIAGNOSIS — R002 Palpitations: Secondary | ICD-10-CM

## 2016-11-30 DIAGNOSIS — M25562 Pain in left knee: Secondary | ICD-10-CM

## 2016-11-30 DIAGNOSIS — M1712 Unilateral primary osteoarthritis, left knee: Secondary | ICD-10-CM

## 2016-11-30 NOTE — Progress Notes (Signed)
BP 110/82 (BP Location: Left Arm, Patient Position: Sitting, Cuff Size: Normal)   Pulse 69   Temp 97.9 F (36.6 C)   Wt 178 lb 8 oz (81 kg)   SpO2 98%   BMI 26.36 kg/m    Subjective:    Patient ID: Logan Bush, male    DOB: 02/03/1960, 56 y.o.   MRN: [id number]  HPI: Logan Bush is a 56 y.o. male presenting on 11/30/2016 for Leg Pain (Left leg pain) and Medication Problem (pt states he stopped taking metoprolol last Saturday. pt states he felt dizzy, dry throat, and states he felt he saw thing at a distance. )   HPI   Chief Complaint  Patient presents with  . Leg Pain    Left leg pain  . Medication Problem    pt states he stopped taking metoprolol last Saturday. pt states he felt dizzy, dry throat, and states he felt he saw thing at a distance.    Pt did not turn in his [employer] discount application yet  Pt is still consuming caffeine.  He was prescribed metoprolol for persistent complaints of palpitations after extensive evaluation over years.   Relevant past medical, surgical, family and social history reviewed and updated as indicated. Interim medical history since our last visit reviewed. Allergies and medications reviewed and updated.   Current Outpatient Medications:  Marland Kitchen.  Glucosamine HCl (GLUCOSAMINE PO), Take 2 tablets by mouth daily., Disp: , Rfl:  .  MAGNESIUM PO, Take 1 tablet by mouth daily., Disp: , Rfl:  .  Omega-3 Fatty Acids (FISH OIL PO), Take 3 tablets by mouth daily., Disp: , Rfl:  .  Psyllium (METAMUCIL FIBER PO), Take by mouth every other day. , Disp: , Rfl:  .  metoprolol tartrate (LOPRESSOR) 25 MG tablet, 1/2 po bid.  1/2 tab por boca dos veces al dia (Patient not taking: Reported on 11/30/2016), Disp: 30 tablet, Rfl: 1   Review of Systems  Constitutional: Positive for fatigue. Negative for appetite change, chills, diaphoresis, fever and unexpected weight change.  HENT: Negative for congestion, drooling, ear pain, facial swelling, hearing loss, mouth sores,  sneezing, sore throat, trouble swallowing and voice change.   Eyes: Positive for itching. Negative for pain, discharge, redness and visual disturbance.  Respiratory: Positive for shortness of breath. Negative for cough, choking and wheezing.   Cardiovascular: Positive for palpitations. Negative for chest pain and leg swelling.  Gastrointestinal: Negative for abdominal pain, blood in stool, constipation, diarrhea and vomiting.  Endocrine: Positive for cold intolerance. Negative for heat intolerance and polydipsia.  Genitourinary: Negative for decreased urine volume, dysuria and hematuria.  Musculoskeletal: Positive for arthralgias, back pain and gait problem.  Skin: Negative for rash.  Allergic/Immunologic: Positive for environmental allergies.  Neurological: Positive for light-headedness. Negative for seizures, syncope and headaches.  Hematological: Negative for adenopathy.  Psychiatric/Behavioral: Positive for agitation. Negative for dysphoric mood and suicidal ideas. The patient is not nervous/anxious.     Per HPI unless specifically indicated above     Objective:    BP 110/82 (BP Location: Left Arm, Patient Position: Sitting, Cuff Size: Normal)   Pulse 69   Temp 97.9 F (36.6 C)   Wt 178 lb 8 oz (81 kg)   SpO2 98%   BMI 26.36 kg/m   Wt Readings from Last 3 Encounters:  11/30/16 178 lb 8 oz (81 kg)  11/18/16 181 lb (82.1 kg)  09/16/16 176 lb 12 oz (80.2 kg)    Physical Exam  Constitutional: He  is oriented to person, place, and time. He appears well-developed and well-nourished.  HENT:  Head: Normocephalic and atraumatic.  Neck: Neck supple.  Cardiovascular: Normal rate and regular rhythm.  Pulmonary/Chest: Effort normal and breath sounds normal. He has no wheezes.  Abdominal: Soft. Bowel sounds are normal. There is no hepatosplenomegaly. There is no tenderness.  Musculoskeletal: He exhibits no edema.       Left knee: He exhibits normal range of motion, no swelling, no  effusion, no ecchymosis, no deformity, no LCL laxity and no MCL laxity.  Lymphadenopathy:    He has no cervical adenopathy.  Neurological: He is alert and oriented to person, place, and time.  Skin: Skin is warm and dry.  Psychiatric: He has a normal mood and affect. His behavior is normal.  Vitals reviewed.       Assessment & Plan:    Encounter Diagnoses  Name Primary?  . Left knee pain, unspecified chronicity Yes  . Osteoarthritis of left knee, unspecified osteoarthritis type   . Palpitations     -discussed conservative treatment for his knee with ice and nsaids and pt goes on about how he can't do anything because his knee is so awful.  Will refer to orthopedics to be evaluated for possible injection or other treatment as deemed appropriate -pt is reminded again that he needs to Turn in [employer] discount application -discussed with pt that he can continue to stay off the beta-blocker.  He is encouraged to avoid caffeine.  Discussed possible anxiety issues that might be contributing to his palpitations and pt thinks that is not the issue -pt to follow up in 3 months.  He is to RTO sooner prn

## 2016-12-20 ENCOUNTER — Other Ambulatory Visit: Payer: Self-pay | Admitting: Physician Assistant

## 2016-12-20 DIAGNOSIS — R002 Palpitations: Secondary | ICD-10-CM

## 2016-12-20 DIAGNOSIS — Z125 Encounter for screening for malignant neoplasm of prostate: Secondary | ICD-10-CM

## 2016-12-30 ENCOUNTER — Ambulatory Visit: Payer: Self-pay | Admitting: Physician Assistant

## 2017-02-21 ENCOUNTER — Other Ambulatory Visit (HOSPITAL_COMMUNITY)
Admission: RE | Admit: 2017-02-21 | Discharge: 2017-02-21 | Disposition: A | Discharge status: Home / Self Care | Payer: Self-pay | Source: Ambulatory Visit | Attending: Physician Assistant | Admitting: Physician Assistant

## 2017-02-21 DIAGNOSIS — R002 Palpitations: Secondary | ICD-10-CM | POA: Insufficient documentation

## 2017-02-21 DIAGNOSIS — Z125 Encounter for screening for malignant neoplasm of prostate: Secondary | ICD-10-CM | POA: Insufficient documentation

## 2017-02-21 LAB — COMPREHENSIVE METABOLIC PANEL
ALK PHOS: 54 U/L (ref 38–126)
ALT: 24 U/L (ref 17–63)
ANION GAP: 9 (ref 5–15)
AST: 21 U/L (ref 15–41)
Albumin: 4.2 g/dL (ref 3.5–5.0)
BUN: 23 mg/dL — ABNORMAL HIGH (ref 6–20)
CO2: 24 mmol/L (ref 22–32)
Calcium: 9.2 mg/dL (ref 8.9–10.3)
Chloride: 106 mmol/L (ref 101–111)
Creatinine, Ser: 1.01 mg/dL (ref 0.61–1.24)
Glucose, Bld: 102 mg/dL — ABNORMAL HIGH (ref 65–99)
Potassium: 4.1 mmol/L (ref 3.5–5.1)
SODIUM: 139 mmol/L (ref 135–145)
Total Bilirubin: 1.1 mg/dL (ref 0.3–1.2)
Total Protein: 7.2 g/dL (ref 6.5–8.1)

## 2017-02-21 LAB — PSA: PROSTATIC SPECIFIC ANTIGEN: 2.78 ng/mL (ref 0.00–4.00)

## 2017-02-21 LAB — HEMOGLOBIN AND HEMATOCRIT, BLOOD
HEMATOCRIT: 43.7 % (ref 39.0–52.0)
Hemoglobin: 14.7 g/dL (ref 13.0–17.0)

## 2017-03-02 ENCOUNTER — Ambulatory Visit: Payer: Self-pay | Admitting: Physician Assistant

## 2017-03-02 ENCOUNTER — Encounter: Payer: Self-pay | Admitting: Physician Assistant

## 2017-03-02 VITALS — BP 117/76 | HR 52 | Ht 69.0 in | Wt 177.0 lb

## 2017-03-02 DIAGNOSIS — H6122 Impacted cerumen, left ear: Secondary | ICD-10-CM

## 2017-03-02 DIAGNOSIS — M1712 Unilateral primary osteoarthritis, left knee: Secondary | ICD-10-CM | POA: Insufficient documentation

## 2017-03-02 DIAGNOSIS — M25562 Pain in left knee: Secondary | ICD-10-CM

## 2017-03-02 DIAGNOSIS — K635 Polyp of colon: Secondary | ICD-10-CM

## 2017-03-02 DIAGNOSIS — R002 Palpitations: Secondary | ICD-10-CM | POA: Insufficient documentation

## 2017-03-02 MED ORDER — CLOTRIMAZOLE-BETAMETHASONE 1-0.05 % EX CREA: 1.0000 "application " | TOPICAL_CREAM | Freq: Two times a day (BID) | CUTANEOUS | 0 refills | Status: DC

## 2017-03-02 NOTE — Progress Notes (Signed)
lotr  BP 117/76 (BP Location: Right Arm, Patient Position: Sitting, Cuff Size: Normal)   Pulse (!) 52   Ht 5\' 9"  (1.753 m)   Wt 177 lb (80.3 kg)   SpO2 97%   BMI 26.14 kg/m    Subjective:    Patient ID: Logan Bush, male    DOB: September 11, 1960, 57 y.o.   MRN: [id number]  HPI: Logan PaulsLuis Donaldson is a 57 y.o. male presenting on 03/02/2017 for Palpitations ( ) and Knee Pain   HPI    Pt says his knee is still feels noise.  He says he walks good.  He sometimes he gets pain when he stands up.  It is not limiting his activities  Pt complains of decreased hearing L ear. No pain.   Relevant past medical, surgical, family and social history reviewed and updated as indicated. Interim medical history since our last visit reviewed. Allergies and medications reviewed and updated.   Current Outpatient Medications:  .  Psyllium (METAMUCIL FIBER PO), Take by mouth every other day. , Disp: , Rfl:    Review of Systems  Constitutional: Positive for fatigue. Negative for appetite change, chills, diaphoresis, fever and unexpected weight change.  HENT: Positive for dental problem, hearing loss and mouth sores. Negative for congestion, drooling, ear pain, facial swelling, sneezing, sore throat, trouble swallowing and voice change.   Eyes: Positive for redness, itching and visual disturbance. Negative for pain and discharge.  Respiratory: Negative for cough, choking, shortness of breath and wheezing.   Cardiovascular: Positive for palpitations. Negative for chest pain and leg swelling.  Gastrointestinal: Negative for abdominal pain, blood in stool, constipation, diarrhea and vomiting.  Endocrine: Positive for cold intolerance. Negative for heat intolerance and polydipsia.  Genitourinary: Negative for decreased urine volume, dysuria and hematuria.  Musculoskeletal: Positive for arthralgias and back pain. Negative for gait problem.  Skin: Negative for rash.  Allergic/Immunologic: Negative for environmental  allergies.  Neurological: Negative for seizures, syncope, light-headedness and headaches.  Hematological: Negative for adenopathy.  Psychiatric/Behavioral: Positive for agitation. Negative for dysphoric mood and suicidal ideas. The patient is not nervous/anxious.     Per HPI unless specifically indicated above     Objective:    BP 117/76 (BP Location: Right Arm, Patient Position: Sitting, Cuff Size: Normal)   Pulse (!) 52   Ht 5\' 9"  (1.753 m)   Wt 177 lb (80.3 kg)   SpO2 97%   BMI 26.14 kg/m   Wt Readings from Last 3 Encounters:  03/02/17 177 lb (80.3 kg)  11/30/16 178 lb 8 oz (81 kg)  11/18/16 181 lb (82.1 kg)    Physical Exam  Constitutional: He is oriented to person, place, and time. He appears well-developed and well-nourished.  HENT:  Head: Normocephalic and atraumatic.  Right Ear: Hearing, tympanic membrane, external ear and ear canal normal.  Left Ear: A foreign body (cerumen) is present.  After lavage, TM normal and pt reports hears well again  Neck: Neck supple.  Cardiovascular: Normal rate and regular rhythm.  Pulmonary/Chest: Effort normal and breath sounds normal. He has no wheezes.  Abdominal: Soft. Bowel sounds are normal. There is no hepatosplenomegaly. There is no tenderness.  Musculoskeletal: He exhibits no edema.  Lymphadenopathy:    He has no cervical adenopathy.  Neurological: He is alert and oriented to person, place, and time.  Skin: Skin is warm and dry.  Psychiatric: He has a normal mood and affect. His behavior is normal.  Vitals reviewed.   Results for orders placed  or performed during the hospital encounter of 02/21/17  Hemoglobin and hematocrit, blood  Result Value Ref Range   Hemoglobin 14.7 13.0 - 17.0 g/dL   HCT 16.1 09.6 - 04.5 %  Comprehensive metabolic panel  Result Value Ref Range   Sodium 139 135 - 145 mmol/L   Potassium 4.1 3.5 - 5.1 mmol/L   Chloride 106 101 - 111 mmol/L   CO2 24 22 - 32 mmol/L   Glucose, Bld 102 (H) 65 - 99  mg/dL   BUN 23 (H) 6 - 20 mg/dL   Creatinine, Ser 4.09 0.61 - 1.24 mg/dL   Calcium 9.2 8.9 - 81.1 mg/dL   Total Protein 7.2 6.5 - 8.1 g/dL   Albumin 4.2 3.5 - 5.0 g/dL   AST 21 15 - 41 U/L   ALT 24 17 - 63 U/L   Alkaline Phosphatase 54 38 - 126 U/L   Total Bilirubin 1.1 0.3 - 1.2 mg/dL   GFR calc non Af Amer >60 >60 mL/min   GFR calc Af Amer >60 >60 mL/min   Anion gap 9 5 - 15  PSA  Result Value Ref Range   Prostatic Specific Antigen 2.78 0.00 - 4.00 ng/mL      Assessment & Plan:   Encounter Diagnoses  Name Primary?  . Left knee pain, unspecified chronicity Yes  . Osteoarthritis of left knee, unspecified osteoarthritis type   . Palpitations   . Impacted cerumen of left ear   . Polyp of colon, unspecified part of colon, unspecified type     -reviewed labs with pt -pt due in October for colonoscopy for polyps -he is reminded to turn in his [employer] discount application -will follow up in early October to refer to GI.  Pt to RTO sooner prn

## 2017-03-24 ENCOUNTER — Encounter: Payer: Self-pay | Admitting: Internal Medicine

## 2017-03-24 ENCOUNTER — Encounter: Payer: Self-pay | Admitting: Physician Assistant

## 2017-03-24 ENCOUNTER — Ambulatory Visit: Payer: Self-pay | Admitting: Physician Assistant

## 2017-03-24 VITALS — BP 119/75 | HR 65 | Temp 98.1°F | Ht 69.0 in | Wt 175.0 lb

## 2017-03-24 DIAGNOSIS — K5900 Constipation, unspecified: Secondary | ICD-10-CM

## 2017-03-24 DIAGNOSIS — K635 Polyp of colon: Secondary | ICD-10-CM

## 2017-03-24 NOTE — Patient Instructions (Signed)

## 2017-03-24 NOTE — Progress Notes (Signed)
BP 119/75 (BP Location: Right Arm, Patient Position: Sitting, Cuff Size: Normal)   Pulse 65   Temp 98.1 F (36.7 C)   Ht 5\' 9"  (1.753 m)   Wt 175 lb (79.4 kg)   SpO2 97%   BMI 25.84 kg/m    Subjective:    Patient ID: Logan Bush, male    DOB: Jun 01, 1960, 57 y.o.   MRN: [id number]  HPI: Logan PaulsLuis Fenner is a 57 y.o. male presenting on 03/24/2017 for Constipation (pt states stool is very thin and long. pt states stool color is a white/pale color. pt states after he has a bowel movement he notices a yellow/white discharge. pt states he feels bloated and has lower back pain. pt states he strains a lot when defacating. pt states he has used patches and minty creams because he thought back pain was muscular.)   HPI   Chief Complaint  Patient presents with  . Constipation    pt states stool is very thin and long. pt states stool color is a white/pale color. pt states after he has a bowel movement he notices a yellow/white discharge. pt states he feels bloated and has lower back pain. pt states he strains a lot when defacating. pt states he has used patches and minty creams because he thought back pain was muscular.     Pt states pencil like stool for past 10 days.    Pt had colonoscopy done in 2014 which showed polyps.   Sigmoid polyp was hyperplastic (scanned under the Media tab). The test also showed hemorrhoids.   Relevant past medical, surgical, family and social history reviewed and updated as indicated. Interim medical history since our last visit reviewed. Allergies and medications reviewed and updated.   Current Outpatient Medications:  .  Psyllium (METAMUCIL FIBER PO), Take by mouth every other day. , Disp: , Rfl:  .  clotrimazole-betamethasone (LOTRISONE) cream, Apply 1 application topically 2 (two) times daily. (Patient not taking: Reported on 03/24/2017), Disp: 15 g, Rfl: 0   Review of Systems  Constitutional: Positive for fatigue. Negative for appetite change, chills,  diaphoresis, fever and unexpected weight change.  HENT: Negative for congestion, dental problem, drooling, ear pain, facial swelling, hearing loss, mouth sores, sneezing, sore throat, trouble swallowing and voice change.   Eyes: Negative for pain, discharge, redness, itching and visual disturbance.  Respiratory: Negative for cough, choking, shortness of breath and wheezing.   Cardiovascular: Negative for chest pain, palpitations and leg swelling.  Gastrointestinal: Negative for abdominal pain, blood in stool, constipation, diarrhea and vomiting.  Endocrine: Negative for cold intolerance, heat intolerance and polydipsia.  Genitourinary: Negative for decreased urine volume, dysuria and hematuria.  Musculoskeletal: Negative for arthralgias, back pain and gait problem.  Skin: Negative for rash.  Allergic/Immunologic: Negative for environmental allergies.  Neurological: Negative for seizures, syncope, light-headedness and headaches.  Hematological: Negative for adenopathy.  Psychiatric/Behavioral: Negative for agitation, dysphoric mood and suicidal ideas. The patient is not nervous/anxious.     Per HPI unless specifically indicated above     Objective:    BP 119/75 (BP Location: Right Arm, Patient Position: Sitting, Cuff Size: Normal)   Pulse 65   Temp 98.1 F (36.7 C)   Ht 5\' 9"  (1.753 m)   Wt 175 lb (79.4 kg)   SpO2 97%   BMI 25.84 kg/m   Wt Readings from Last 3 Encounters:  03/24/17 175 lb (79.4 kg)  03/02/17 177 lb (80.3 kg)  11/30/16 178 lb 8 oz (81 kg)  Physical Exam  Constitutional: He is oriented to person, place, and time. He appears well-developed and well-nourished.  HENT:  Head: Normocephalic and atraumatic.  Neck: Neck supple.  Cardiovascular: Normal rate and regular rhythm.  Pulmonary/Chest: Effort normal and breath sounds normal. He has no wheezes.  Abdominal: Soft. Bowel sounds are normal. There is no hepatosplenomegaly. There is no tenderness.   Musculoskeletal: He exhibits no edema.  Lymphadenopathy:    He has no cervical adenopathy.  Neurological: He is alert and oriented to person, place, and time.  Skin: Skin is warm and dry.  Psychiatric: He has a normal mood and affect. His behavior is normal.  Vitals reviewed.       Assessment & Plan:   Encounter Diagnoses  Name Primary?  . Constipation, unspecified constipation type Yes  . Polyp of colon, unspecified part of colon, unspecified type      -discussed with pt that thin stools likely due to some constipation.   Encouraged pt to increase water and fiber intake, use stool softener for several days. -Pt is due for repeat colonoscopy due to the polyp so will refer to GI.  Pt says he has already turned in charity care application -pt to Follow up October as scheduled.  RTO sooner prn

## 2017-04-05 ENCOUNTER — Ambulatory Visit: Payer: Self-pay | Admitting: Physician Assistant

## 2017-04-05 ENCOUNTER — Encounter: Payer: Self-pay | Admitting: Physician Assistant

## 2017-04-05 VITALS — BP 129/72 | HR 75 | Temp 97.9°F | Ht 69.0 in | Wt 178.5 lb

## 2017-04-05 DIAGNOSIS — R42 Dizziness and giddiness: Secondary | ICD-10-CM

## 2017-04-05 MED ORDER — MECLIZINE HCL 25 MG PO TABS: ORAL_TABLET | ORAL | 1 refills | Status: DC

## 2017-04-05 NOTE — Progress Notes (Signed)
BP 129/72 (BP Location: Right Arm, Patient Position: Sitting, Cuff Size: Normal)   Pulse 75   Temp 97.9 F (36.6 C)   Ht 5\' 9"  (1.753 m)   Wt 178 lb 8 oz (81 kg)   SpO2 95%   BMI 26.36 kg/m    Subjective:    Patient ID: Logan Bush, male    DOB: [birth year], 57 y.o.   MRN: [id number]  HPI: Logan PaulsLuis Schindel is a 57 y.o. male presenting on 04/05/2017 for Dizziness    Patient presents for evaluation of dizziness. The symptoms started the beginning of February and have gotten worse since that time.  The attacks occur 3 or 4 times daily and last 2-3 minutes.  Episodes every 2 or 3 days.  Positions that worsen symptoms: bending over to tie shoes. Previous workup/treatments: none except cold shower helped. Associated ear symptoms:  No ringing or pain. Associated CNS symptoms: none, just dizzy. Recent infections: no. Head trauma: no . Drug ingestion: he says it is worse when he takes stool softener.   pt wants referral to neurologist "because I know my body"  Relevant past medical, surgical, family and social history reviewed and updated as indicated. Interim medical history since our last visit reviewed. Allergies and medications reviewed and updated.  Review of Systems  Constitutional: Positive for fatigue. Negative for appetite change, chills, diaphoresis, fever and unexpected weight change.  HENT: Positive for dental problem. Negative for congestion, drooling, ear pain, facial swelling, hearing loss, mouth sores, sneezing, sore throat, trouble swallowing and voice change.   Eyes: Positive for pain and itching. Negative for discharge, redness and visual disturbance.  Respiratory: Positive for shortness of breath. Negative for cough, choking and wheezing.   Cardiovascular: Positive for palpitations and leg swelling. Negative for chest pain.  Gastrointestinal: Negative for abdominal pain, blood in stool, constipation, diarrhea and vomiting.  Endocrine: Negative for cold intolerance, heat  intolerance and polydipsia.  Genitourinary: Negative for decreased urine volume, dysuria and hematuria.  Musculoskeletal: Positive for back pain. Negative for arthralgias and gait problem.  Skin: Negative for rash.  Allergic/Immunologic: Negative for environmental allergies.  Neurological: Positive for light-headedness and headaches. Negative for seizures and syncope.  Hematological: Negative for adenopathy.  Psychiatric/Behavioral: Positive for agitation. Negative for dysphoric mood and suicidal ideas. The patient is not nervous/anxious.     Per HPI unless specifically indicated above     Objective:    BP 129/72 (BP Location: Right Arm, Patient Position: Sitting, Cuff Size: Normal)   Pulse 75   Temp 97.9 F (36.6 C)   Ht 5\' 9"  (1.753 m)   Wt 178 lb 8 oz (81 kg)   SpO2 95%   BMI 26.36 kg/m   Wt Readings from Last 3 Encounters:  04/05/17 178 lb 8 oz (81 kg)  03/24/17 175 lb (79.4 kg)  03/02/17 177 lb (80.3 kg)    Orthostatic VS for the past 24 hrs:  BP- Lying Pulse- Lying BP- Sitting Pulse- Sitting BP- Standing at 0 minutes Pulse- Standing at 0 minutes  04/05/17 0955 120/69 66 126/75 68 124/76 78     Physical Exam  Constitutional: He is oriented to person, place, and time. He appears well-developed and well-nourished.  HENT:  Head: Normocephalic and atraumatic.  Right Ear: Hearing, tympanic membrane, external ear and ear canal normal.  Left Ear: Hearing, tympanic membrane, external ear and ear canal normal.  Nose: Nose normal.  Mouth/Throat: Uvula is midline and oropharynx is clear and moist. No uvula swelling. No  oropharyngeal exudate, posterior oropharyngeal edema, posterior oropharyngeal erythema or tonsillar abscesses.  Eyes: Conjunctivae and EOM are normal. Pupils are equal, round, and reactive to light. Right eye exhibits no nystagmus. Left eye exhibits no nystagmus.  Neck: Neck supple.  Cardiovascular: Normal rate and regular rhythm.  Pulmonary/Chest: Effort normal  and breath sounds normal. He has no wheezes.  Abdominal: Soft. Bowel sounds are normal. There is no hepatosplenomegaly. There is no tenderness.  Musculoskeletal: He exhibits no edema.  Lymphadenopathy:    He has no cervical adenopathy.  Neurological: He is alert and oriented to person, place, and time. He has normal reflexes. He displays no tremor. No cranial nerve deficit or sensory deficit. He exhibits normal muscle tone. He displays a negative Romberg sign. Coordination and gait normal.  Skin: Skin is warm and dry.  Psychiatric: He has a normal mood and affect. His behavior is normal.  Vitals reviewed.   Results for orders placed or performed during the hospital encounter of 02/21/17  Hemoglobin and hematocrit, blood  Result Value Ref Range   Hemoglobin 14.7 13.0 - 17.0 g/dL   HCT 16.1 09.6 - 04.5 %  Comprehensive metabolic panel  Result Value Ref Range   Sodium 139 135 - 145 mmol/L   Potassium 4.1 3.5 - 5.1 mmol/L   Chloride 106 101 - 111 mmol/L   CO2 24 22 - 32 mmol/L   Glucose, Bld 102 (H) 65 - 99 mg/dL   BUN 23 (H) 6 - 20 mg/dL   Creatinine, Ser 4.09 0.61 - 1.24 mg/dL   Calcium 9.2 8.9 - 81.1 mg/dL   Total Protein 7.2 6.5 - 8.1 g/dL   Albumin 4.2 3.5 - 5.0 g/dL   AST 21 15 - 41 U/L   ALT 24 17 - 63 U/L   Alkaline Phosphatase 54 38 - 126 U/L   Total Bilirubin 1.1 0.3 - 1.2 mg/dL   GFR calc non Af Amer >60 >60 mL/min   GFR calc Af Amer >60 >60 mL/min   Anion gap 9 5 - 15  PSA  Result Value Ref Range   Prostatic Specific Antigen 2.78 0.00 - 4.00 ng/mL      Assessment & Plan:    Encounter Diagnosis  Name Primary?  . Vertigo Yes    -rx meclizine -counseled pt on vertigo and gave handout -pt to follow up as scheduled.  RTO sooner prn

## 2017-04-05 NOTE — Patient Instructions (Signed)
Vértigo  Vertigo  El vértigo es la sensación de que usted o todo lo que lo rodea se mueve cuando en realidad eso no sucede. El vértigo puede ser peligroso si ocurre mientras está haciendo algo que podría suponer un riesgo para usted y para los demás, por ejemplo, conduciendo un automóvil.  ¿Cuáles son las causas?  Esta afección es causada por una alteración en las señales que su sistema sensorial envía al cerebro. Existen diferentes factores que pueden causar esta alteración, por ejemplo:  · Infecciones, especialmente en el oído interno.  · Una reacción adversa a un medicamento o el uso indebido de alcohol y medicamentos.  · Abstinencia de drogas o alcohol.  · Cambios rápidos de posición, como al acostarse o darse vuelta en la cama.  · Cefaleas migrañosas.  · Una disminución del flujo sanguíneo hacia el cerebro.  · Una disminución de la presión arterial.  · Un aumento de la presión en el cerebro por un traumatismo en la cabeza o el cuello, accidente cerebrovascular, infección, tumor o sangrado.  · Trastornos del sistema nervioso central.    ¿Cuáles son los signos o los síntomas?  Generalmente, los síntomas de este trastorno se presentan al mover la cabeza o los ojos en diferentes direcciones. Los síntomas pueden aparecer repentinamente y suelen durar menos de un minuto. Entre los síntomas se pueden incluir los siguientes:  · Pérdida del equilibrio y caídas.  · Sensación de estar dando vueltas o moviéndose.  · Sensación de que el entorno está dando vueltas o moviéndose.  · Náuseas y vómitos.  · Visión doble o borrosa.  · Dificultad para escuchar.  · Hablar arrastrando las palabras.  · Mareos.  · Movimientos oculares involuntarios (nistagmo).    Los síntomas pueden ser leves y un poco molestos, o pueden ser graves e interferir con la vida cotidiana. Los episodios de vértigo pueden repetirse (ser recurrentes) a lo largo del tiempo y algunos movimientos pueden desencadenarlos. Los síntomas pueden mejorar con el tiempo.   ¿Cómo se diagnostica?  Esta afección puede diagnosticarse en función de la historia clínica y la calidad del nistagmo. Su médico podrá examinar el movimiento de sus ojos indicándole que los cambie de dirección rápidamente para provocar el nistagmo. Esto se llama también prueba de Dix-Hallpike, prueba de impulso cefálico o prueba de rotación. Tal vez lo deriven a un médico especialista en oído, nariz y garganta (otorrinolaringólogo) o a uno que se especializa en trastornos del sistema nervioso central (neurólogo).  Pueden hacerle otros estudios, entre ellos:  · Un examen físico.  · Análisis de sangre.  · Resonancia magnética (RM).  · Una exploración por tomografía computarizada (TC).  · Un electrocardiograma (ECG). Este estudio registra la actividad eléctrica del corazón.  · Un electroencefalograma (EEG). Este estudio registra la actividad eléctrica del cerebro.  · Pruebas de audición.    ¿Cómo se trata?  El tratamiento de esta afección depende de la causa y la gravedad de los síntomas. Las opciones de tratamiento incluyen:  · Medicamentos para tratar las náuseas o el vértigo. Se utilizan generalmente para los casos graves. Algunos medicamentos que se utilizan para tratar otras afecciones también podrían reducir o eliminar los síntomas del vértigo. Estos incluyen los siguientes:  ? Medicamentos para controlar las alergias (antihistamínicos).  ? Medicamentos para controlar las convulsiones (anticonvulsivos).  ? Medicamentos para aliviar la depresión (antidepresivos).  ? Medicamentos para aliviar la ansiedad (sedantes).  · Movimientos de cabeza para acomodar el oído interno otra vez a la normalidad. Si el   vértigo es causado por un problema en el oído, su médico podría recomendarle que haga ciertos movimientos para corregir el problema.  · Cirugía. Esto es raro.    Siga estas indicaciones en su casa:  Seguridad  · Muévase despacio.No haga movimientos bruscos con el cuerpo o con la cabeza.  · No conduzca.   · No opere maquinaria pesada.  · No haga ninguna tarea que podría ser peligrosa para usted o para otras personas en caso de que ocurriera un episodio de vértigo.  · Si tiene dificultad para caminar o mantener el equilibrio, use un bastón para mantener la estabilidad. Si se siente mareado o inestable, siéntese de inmediato.  · Retome sus actividades normales como se lo haya indicado el médico. Pregúntele al médico qué actividades son seguras para usted.  Instrucciones generales  · Tome los medicamentos de venta libre y los recetados solamente como se lo haya indicado el médico.  · Evite algunas posiciones o determinados movimientos como se lo haya indicado el médico.  · Beba suficiente líquido para mantener la orina clara o de color amarillo pálido.  · Concurra a todas las visitas de seguimiento como se lo haya indicado el médico. Esto es importante.  Comuníquese con un médico si:  · Los medicamentos no le alivian el vértigo o este empeora.  · Tiene fiebre.  · Su afección empeora o presenta síntomas nuevos.  · Sus familiares o amigos advierten cambios en su comportamiento.  · Las náuseas o los vómitos empeoran.  · Tiene sensación de adormecimiento o de “hormigueo” en una parte del cuerpo.  Solicite ayuda de inmediato si:  · Tiene dificultad para hablar o para moverse.  · Esta mareado todo el tiempo.  · Se desmaya.  · Tiene dolores de cabeza intensos.  · Tiene debilidad en las manos, los brazos o las piernas.  · Presenta cambios en la audición o la visión.  · Siente rigidez en el cuello.  · Tiene sensibilidad a la luz.  Esta información no tiene como fin reemplazar el consejo del médico. Asegúrese de hacerle al médico cualquier pregunta que tenga.  Document Released: 10/21/2004 Document Revised: 04/20/2016 Document Reviewed: 05/06/2014  Elsevier Interactive Patient Education © 2018 [employer]

## 2017-04-26 IMAGING — US US ABDOMEN LIMITED
1 series · 14 of 25 positions shown · non-contrast
Comparison: None.

CLINICAL DATA: Postprandial right upper quadrant pain

EXAM:
ULTRASOUND ABDOMEN LIMITED RIGHT UPPER QUADRANT

[Series 2: us abdomen limited · 0.18mm/px · 14 of 61 slices shown]
[im 1/61]
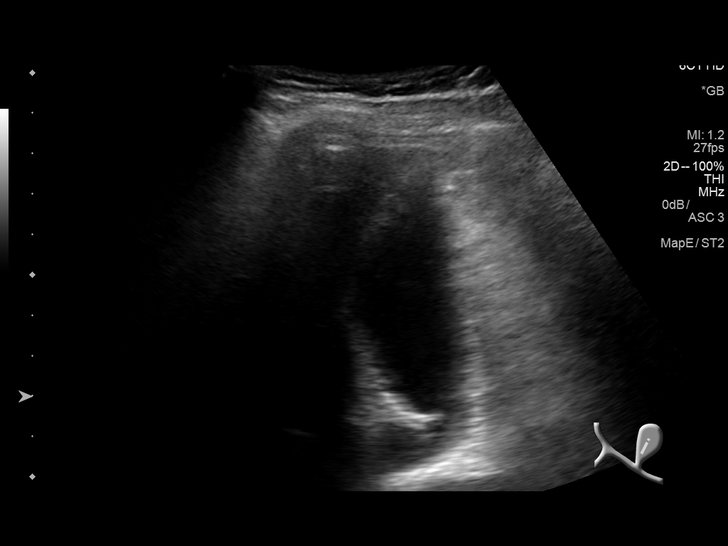
[im 6/61]
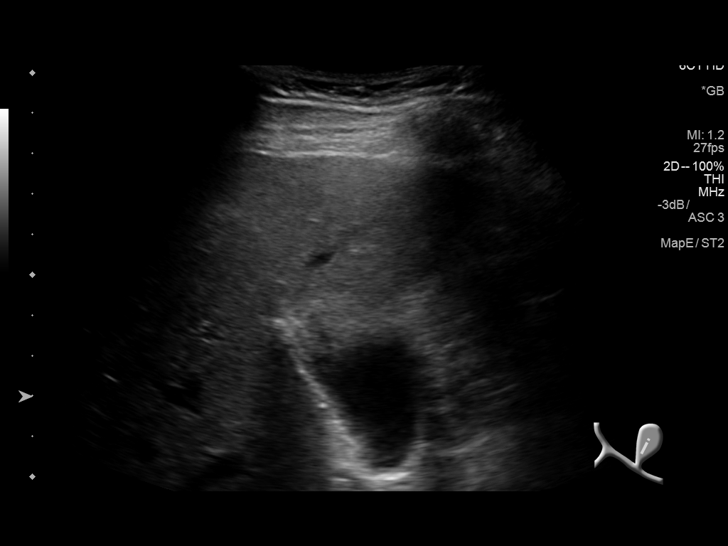
[im 11/61]
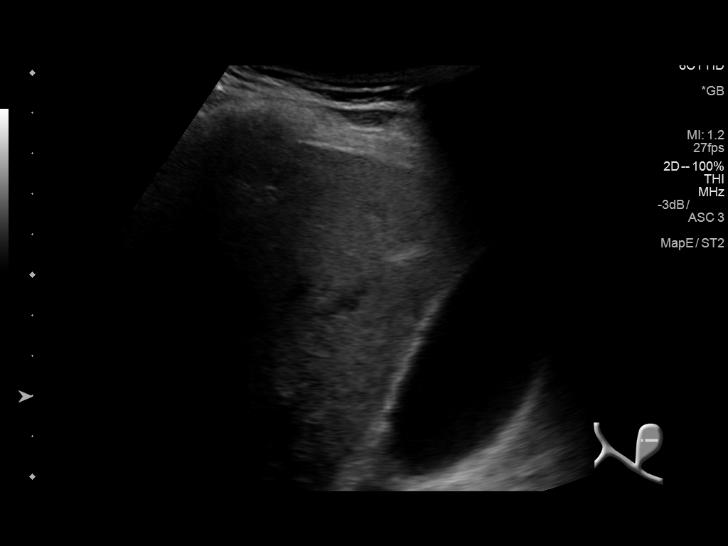
[im 16/61]
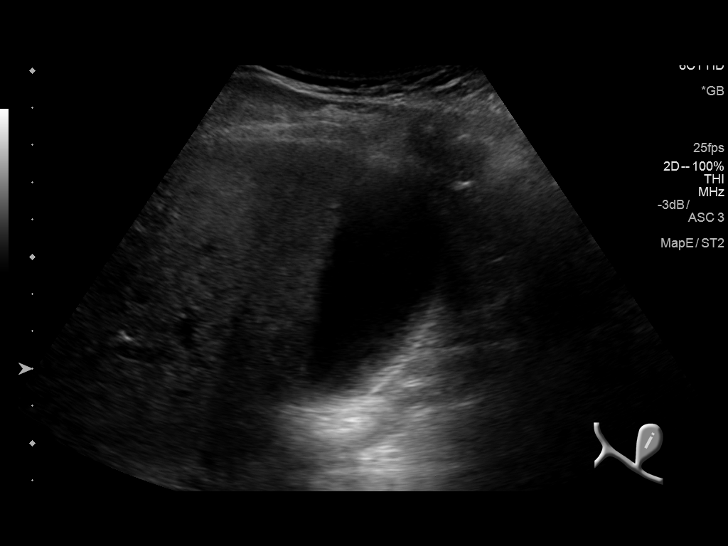
[im 21/61]
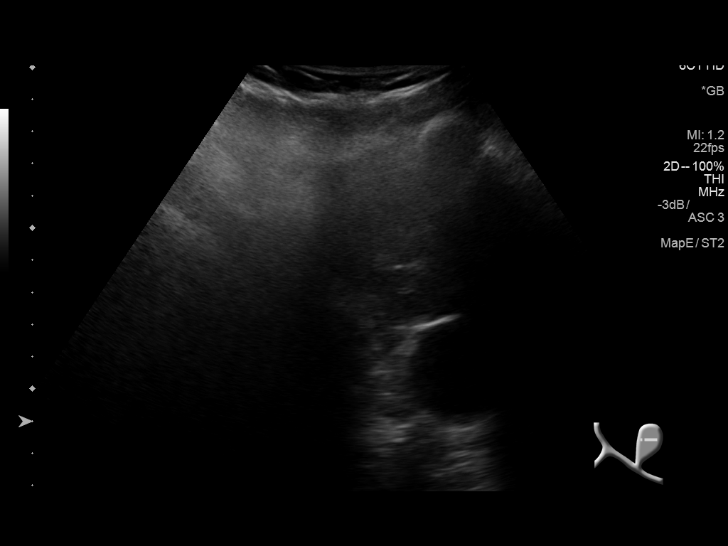
[im 23/61]
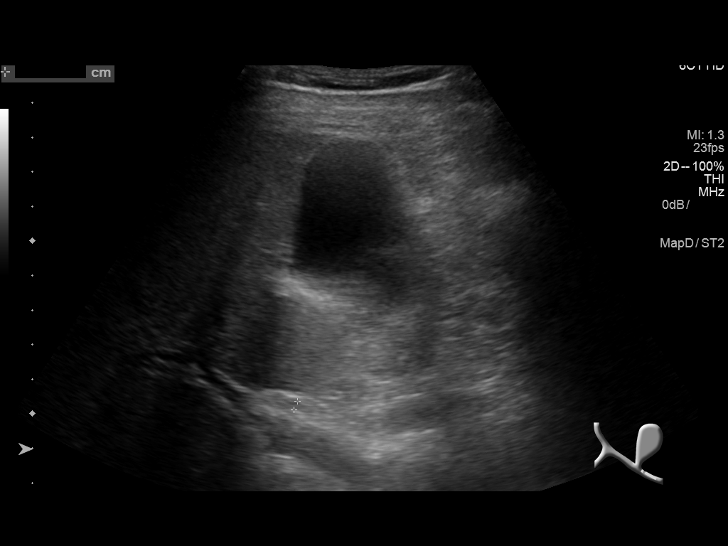
[im 28/61]
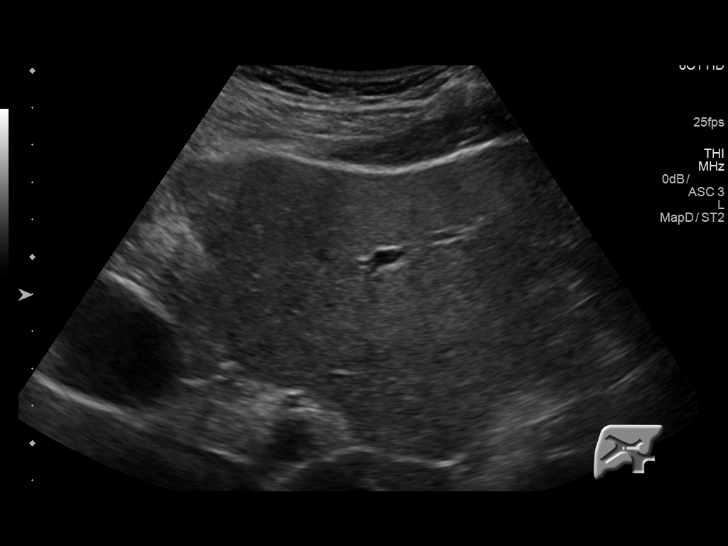
[im 33/61]
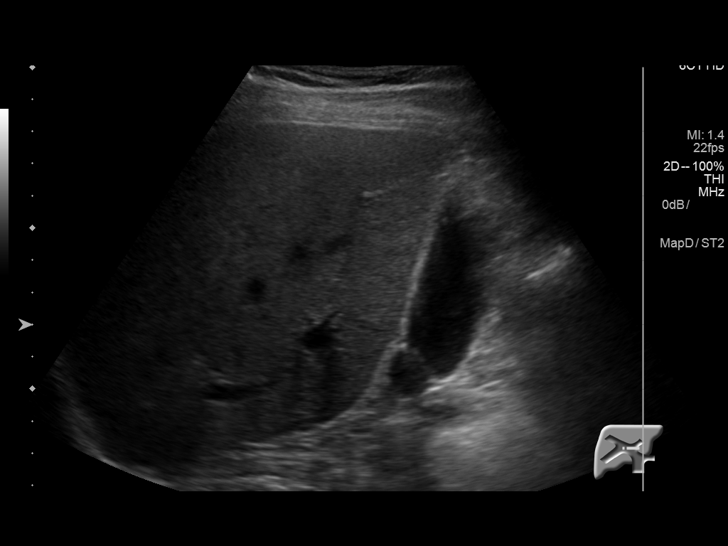
[im 38/61]
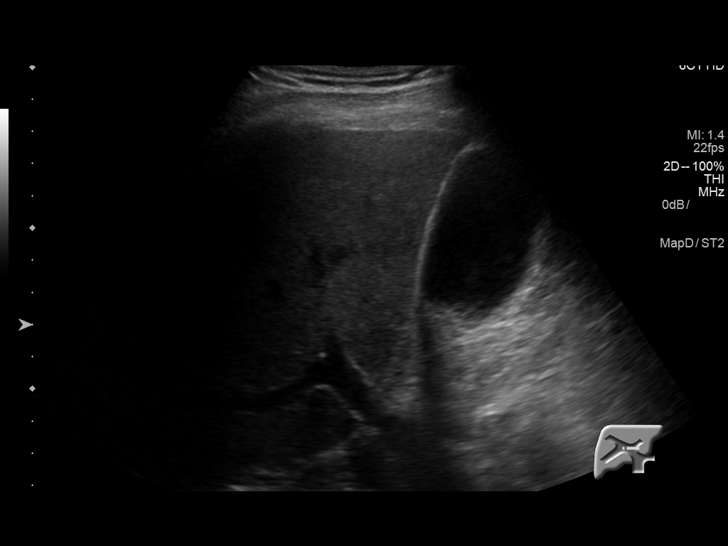
[im 41/61]
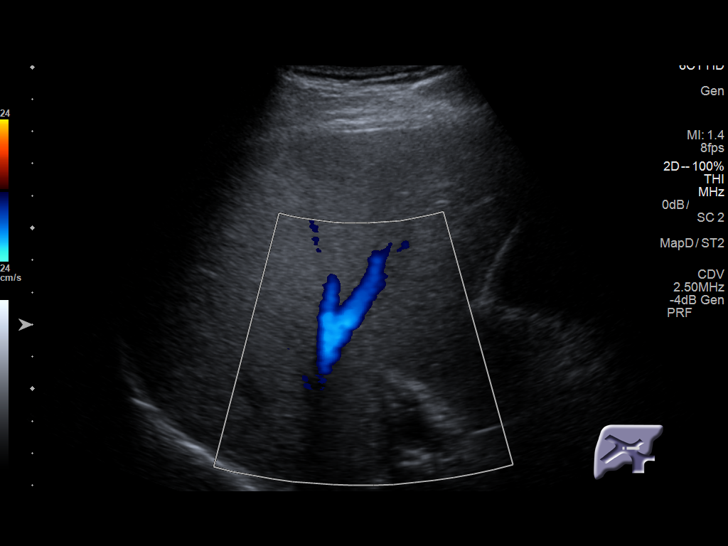
[im 46/61]
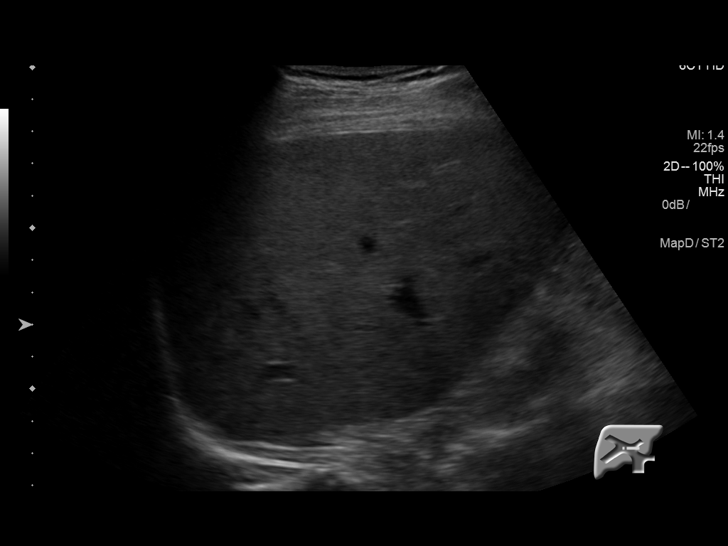
[im 51/61]
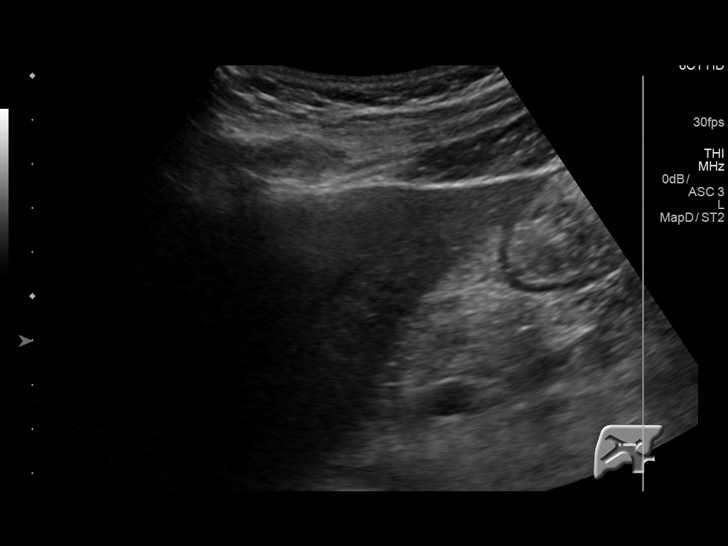
[im 56/61]
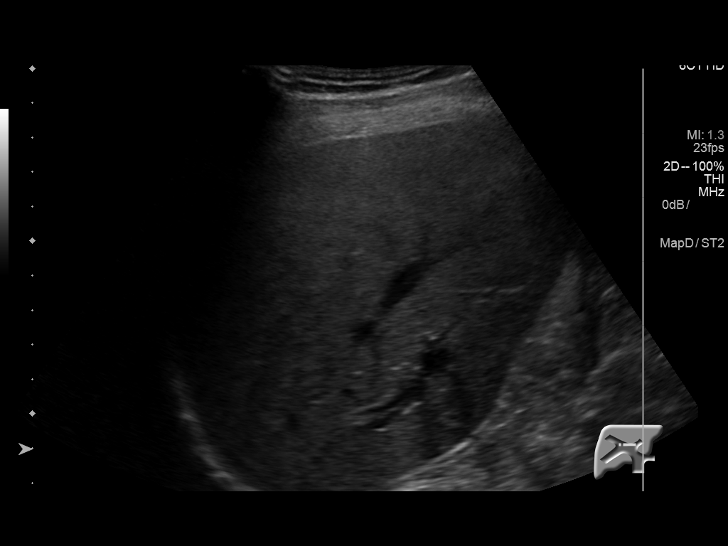
[im 61/61]
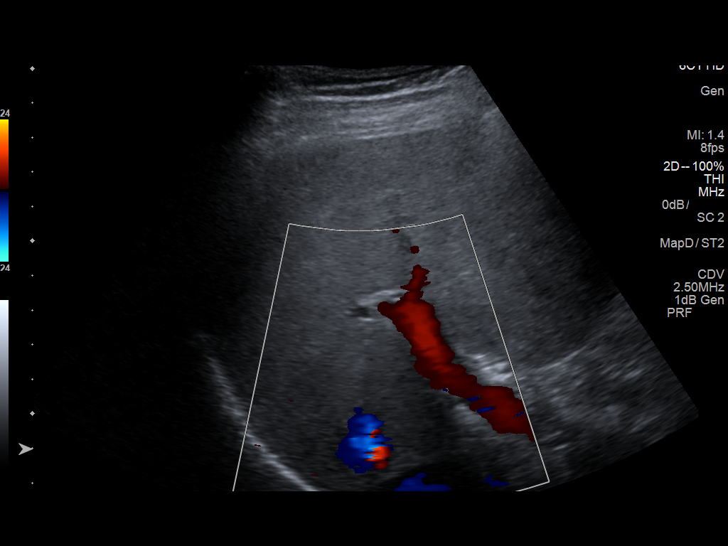

[14 of 25 positions shown; findings below may reference images not displayed]

FINDINGS: Gallbladder:

No gallstones or wall thickening visualized. No sonographic Murphy
sign noted by sonographer.

Common bile duct:

Diameter: 2.7 mm

Liver:

No focal lesion identified. Within normal limits in parenchymal
echogenicity. Portal vein is patent on color Doppler imaging with
normal direction of blood flow towards the liver.
IMPRESSION: Normal right upper quadrant ultrasound. If there are clinical
concerns of chronic cholecystitis, a nuclear medicine hepatobiliary
scan with gallbladder ejection fraction determination may be useful.

## 2017-05-12 ENCOUNTER — Other Ambulatory Visit: Payer: Self-pay

## 2017-05-12 ENCOUNTER — Ambulatory Visit (INDEPENDENT_AMBULATORY_CARE_PROVIDER_SITE_OTHER): Payer: Self-pay | Admitting: Gastroenterology

## 2017-05-12 ENCOUNTER — Encounter: Payer: Self-pay | Admitting: Gastroenterology

## 2017-05-12 VITALS — BP 136/89 | HR 63 | Temp 98.1°F | Ht 66.0 in | Wt 177.8 lb

## 2017-05-12 DIAGNOSIS — R194 Change in bowel habit: Secondary | ICD-10-CM

## 2017-05-12 DIAGNOSIS — R1011 Right upper quadrant pain: Secondary | ICD-10-CM

## 2017-05-12 NOTE — Patient Instructions (Signed)
We have scheduled you for a colonoscopy with Dr. Jena Gaussourk in the near future.  I have also ordered an ultrasound of your gallbladder.  Further recommendations to follow!  It was a pleasure to see you today. I strive to create trusting relationships with patients to provide genuine, compassionate, and quality care. I value your feedback. If you receive a survey regarding your visit,  I greatly appreciate you taking time to fill this out.   Gelene MinkAnna W. Boone, PhD, ANP-BC [facility]

## 2017-05-12 NOTE — Progress Notes (Signed)
cc'ed to pcp °

## 2017-05-12 NOTE — Assessment & Plan Note (Addendum)
Very pleasant 57 year old male, "Logan Bush", presenting at request of PCP secondary to evaluation for colonoscopy. From what I gather, his last colonoscopy in 2014 was in [place] with hyperplastic polyp. No adenomas or family history of colorectal cancer. No FH of colon polyps. He has had no rectal bleeding but endorses a change in bowel habits that is concerning to him. Notes frequent bowel movements following a "normal" BM throughout the day, ending with mucus-like discharge. No weight loss or other alarm symptoms. No diarrhea, so this is not likely to be an infectious process. We discussed screening and surveillance intervals for colonoscopies per guidelines; he would normally be due in 2024. Although he has had a change in bowel habits, there has not been a change in the caliber of stool per his report or overt rectal bleeding. Mucus may be simply related to frequent stools, ?hemorrhoids, doubt occult problem. However, it has been 5 years, he is concerned, and it is not unreasonable to complete a colonoscopy here as we have never directly visualized his colon.   Proceed with TCS with Dr. Jena Gaussourk in near future: the risks, benefits, and alternatives have been discussed with the patient in detail. The patient states understanding and desires to proceed. Will attempt to retrieve colonoscopy procedure note from [place]. Addendum: unable to retrieve.

## 2017-05-12 NOTE — Progress Notes (Signed)
Primary Care Physician:  Jacquelin HawkingMcElroy, Shannon, PA-C Primary Gastroenterologist:  Dr. Jena Gaussourk   Chief Complaint  Patient presents with  . Colonoscopy    consult, Last TCS 09/2012  . change in bowels    have about 9 stools per day    HPI:   Logan Bush is a 57 y.o. male presenting today at the request of Jacquelin HawkingShannon McElroy, [place], to evaluate for possible colonoscopy.   He reports a colonoscopy in 2014 in [place]. Path is available under media noting "hyperplastic". Actual procedure note not available.    Has pain in RUQ. Waxes and wanes. Will change positions to feel better. Sometimes movement worsens. About 2 hours postprandially will start with discomfort. Only feels nauseated with vertigo. No typical reflux symptoms.   Has 2 BMs in the morning that are sometimes soft, sometimes dry,  then goes multiple times after that are soft-serve, with the final mucus-like.  Doesn't feel constipated. Symptoms present for about 1.5 years. Started end of 2017. No weight loss or poor appetite. Taking Metamucil daily, which he states his doctor in [place] told him to do. Stopped in 2017 because he was feeling better but then started again. Was having same symptoms in 2014. Some days will have just a few BMs without frequent BMs. At times dietary-related. Sometimes no relation to diet. Notes urgency for BMs at times. He desires endoscopic evaluation.   Past Medical History:  Diagnosis Date  . Allergy   . Vertigo     Past Surgical History:  Procedure Laterality Date  . POLYPECTOMY  10/2012    Current Outpatient Medications  Medication Sig Dispense Refill  . meclizine (ANTIVERT) 25 MG tablet 1 tablet po q 8 hour prn dizziness.  1 tableta por boca cada 8 horas cuando sea necesario para el mareo 30 tablet 1  . Psyllium (METAMUCIL FIBER PO) Take by mouth daily.      No current facility-administered medications for this visit.     Allergies as of 05/12/2017 - Review Complete 05/12/2017  Allergen Reaction Noted    . Aspirin  10/14/2015    Family History  Problem Relation Age of Onset  . Heart attack Mother   . Hypertension Mother   . Cancer Father        prostate cancer  . Asthma Father   . Cancer Sister        breast cancer  . Cancer Maternal Grandmother        Breast Cancer  . Colon cancer Neg Hx   . Colon polyps Neg Hx     Social History   Socioeconomic History  . Marital status: Married    Spouse name: Not on file  . Number of children: Not on file  . Years of education: Not on file  . Highest education level: Not on file  Occupational History  . Not on file  Social Needs  . Financial resource strain: Not on file  . Food insecurity:    Worry: Not on file    Inability: Not on file  . Transportation needs:    Medical: Not on file    Non-medical: Not on file  Tobacco Use  . Smoking status: Passive Smoke Exposure - Never Smoker  . Smokeless tobacco: Never Used  Substance and Sexual Activity  . Alcohol use: No  . Drug use: No  . Sexual activity: Not on file  Lifestyle  . Physical activity:    Days per week: Not on file  Minutes per session: Not on file  . Stress: Not on file  Relationships  . Social connections:    Talks on phone: Not on file    Gets together: Not on file    Attends religious service: Not on file    Active member of club or organization: Not on file    Attends meetings of clubs or organizations: Not on file    Relationship status: Not on file  . Intimate partner violence:    Fear of current or ex partner: Not on file    Emotionally abused: Not on file    Physically abused: Not on file    Forced sexual activity: Not on file  Other Topics Concern  . Not on file  Social History Narrative  . Not on file    Review of Systems: Gen: Denies any fever, chills, fatigue, weight loss, lack of appetite.  CV: Denies chest pain, heart palpitations, peripheral edema, syncope.  Resp: Denies shortness of breath at rest or with exertion. Denies wheezing or  cough.  GI: see HPI  GU : Denies urinary burning, urinary frequency, urinary hesitancy MS: Denies joint pain, muscle weakness, cramps, or limitation of movement.  Derm: Denies rash, itching, dry skin Psych: Denies depression, anxiety, memory loss, and confusion Heme: Denies bruising, bleeding, and enlarged lymph nodes.  Physical Exam: BP 136/89   Pulse 63   Temp 98.1 F (36.7 C) (Oral)   Ht 5\' 6"  (1.676 m)   Wt 177 lb 12.8 oz (80.6 kg)   BMI 28.70 kg/m  General:   Alert and oriented. Pleasant and cooperative. Well-nourished and well-developed.  Head:  Normocephalic and atraumatic. Eyes:  Without icterus, sclera clear and conjunctiva pink.  Ears:  Normal auditory acuity. Nose:  No deformity, discharge,  or lesions. Mouth:  No deformity or lesions, oral mucosa pink.  Lungs:  Clear to auscultation bilaterally. No wheezes, rales, or rhonchi. No distress.  Heart:  S1, S2 present without murmurs appreciated.  Abdomen:  +BS, soft, mild "tightness" reported by patient with RUQ palpation, no discomfort or tenderness, and non-distended. No HSM noted. No guarding or rebound. No masses appreciated.  Rectal:  Deferred  Msk:  Symmetrical without gross deformities. Normal posture. Extremities:  Without edema. Neurologic:  Alert and  oriented x4 Psych:  Alert and cooperative. Normal mood and affect.  Lab Results  Component Value Date   HGB 14.7 02/21/2017    Lab Results  Component Value Date   ALT 24 02/21/2017   AST 21 02/21/2017   ALKPHOS 54 02/21/2017   BILITOT 1.1 02/21/2017

## 2017-05-12 NOTE — Assessment & Plan Note (Addendum)
Postprandial, with some exacerbation at times with movement. No N/V, weight loss, lack of appetite, dysphagia, or typical chronic GERD symptoms. With postprandial component, will check US RUQ. Most recent LFTs normal.   Addendum: US abdomen normal. HIDA normal. Now denying any postprandial symptoms. No alarm symptoms. No typical GERD symptoms. Doubt EGD would offer any further info. May need CT. Await colonoscopy findings.

## 2017-05-27 ENCOUNTER — Telehealth: Payer: Self-pay | Admitting: Gastroenterology

## 2017-05-27 NOTE — Telephone Encounter (Signed)
Darl Pikes: any luck with procedure notes from [place]? Last colonoscopy. Thanks!

## 2017-05-27 NOTE — Telephone Encounter (Signed)
I sent the request the same day patient was here. Nothing yet. I will re-fax it again

## 2017-06-06 ENCOUNTER — Telehealth: Payer: Self-pay

## 2017-06-06 ENCOUNTER — Ambulatory Visit (HOSPITAL_COMMUNITY)
Admission: RE | Admit: 2017-06-06 | Discharge: 2017-06-06 | Disposition: A | Discharge status: Home / Self Care | Payer: Self-pay | Source: Ambulatory Visit | Attending: Gastroenterology | Admitting: Gastroenterology

## 2017-06-06 DIAGNOSIS — R1011 Right upper quadrant pain: Secondary | ICD-10-CM | POA: Insufficient documentation

## 2017-06-06 NOTE — Telephone Encounter (Signed)
Pt came by office, requested to reschedule tcs that was for 07/12/17. TCS w/RMR rescheduled to 08/05/17 at 9:30am. New instructions given. LMOVM and informed endo scheduler.

## 2017-06-07 NOTE — Progress Notes (Signed)
Normal RUQ ultrasound. He has no other upper GI symptoms. Possible musculoskeletal? Doubt dealing with biliary etiology. How is he doing?

## 2017-06-20 NOTE — Progress Notes (Signed)
I recommend HIDA scan. I doubt he has an upper GI etiology due to his reports. Unclear if biliary. If he is willing, we can pursue this.

## 2017-06-22 ENCOUNTER — Other Ambulatory Visit: Payer: Self-pay | Admitting: *Deleted

## 2017-06-22 DIAGNOSIS — R1011 Right upper quadrant pain: Secondary | ICD-10-CM

## 2017-06-27 ENCOUNTER — Encounter (HOSPITAL_COMMUNITY): Payer: Self-pay

## 2017-06-27 ENCOUNTER — Encounter (HOSPITAL_COMMUNITY)
Admission: RE | Admit: 2017-06-27 | Discharge: 2017-06-27 | Disposition: A | Discharge status: Home / Self Care | Payer: Self-pay | Source: Ambulatory Visit | Attending: Gastroenterology | Admitting: Gastroenterology

## 2017-06-27 DIAGNOSIS — R1011 Right upper quadrant pain: Secondary | ICD-10-CM | POA: Insufficient documentation

## 2017-06-27 IMAGING — NM NM HEPATO W/GB/PHARM/[PERSON_NAME]
2 series · 12 of 12 positions shown · non-contrast
Comparison: None.

CLINICAL DATA: Right quadrant pain

EXAM:
NUCLEAR MEDICINE HEPATOBILIARY IMAGING WITH GALLBLADDER EF
VIEWS:
Anterior right upper quadrant
RADIOPHARMACEUTICALS:  5.4 mCi [6U]  Choletec IV

[Series 1: biliary · 3.25mm/px · 6 of 60 frames shown]
[frame 6/60]
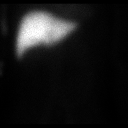
[frame 16/60]
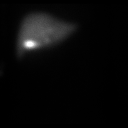
[frame 26/60]
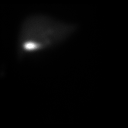
[frame 36/60]
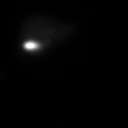
[frame 46/60]
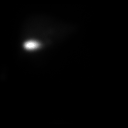
[frame 56/60]
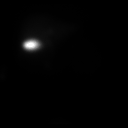

[Series 2: gbef · 3.25mm/px · 6 of 60 frames shown]
[frame 6/60]
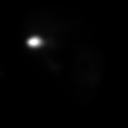
[frame 16/60]
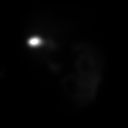
[frame 26/60]
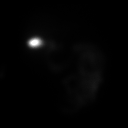
[frame 36/60]
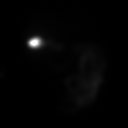
[frame 46/60]
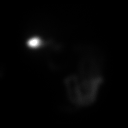
[frame 56/60]
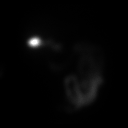

[12 of 12 positions shown; findings below may reference images not displayed]

FINDINGS: Liver uptake of radiotracer is normal. There is prompt visualization
of gallbladder and small bowel, indicating patency of the cystic and
common bile ducts. Patient consumed 8 ounces of Ensure Plus orally
with calculation of the computer generated ejection fraction of
radiotracer from the gallbladder. The patient did not experience
clinical symptoms with the oral Ensure Plus consumption. The
computer generated ejection fraction of radiotracer from the
gallbladder is normal at 56%, normal greater than 33% using the oral
agent.
IMPRESSION: Study within normal limits.

## 2017-06-27 MED ORDER — TECHNETIUM TC 99M MEBROFENIN IV KIT
5.0000 | PACK | Freq: Once | INTRAVENOUS | Status: AC | PRN
  Administered 2017-06-27: 5.4 via INTRAVENOUS

## 2017-07-04 NOTE — Progress Notes (Signed)
HIDA is completely normal. He did not have symptoms with Ensure. Is he having pain after eating?

## 2017-07-25 NOTE — Progress Notes (Signed)
TCS scheduled for 08/05/2017.

## 2017-08-05 ENCOUNTER — Encounter (HOSPITAL_COMMUNITY): Payer: Self-pay

## 2017-08-05 ENCOUNTER — Ambulatory Visit (HOSPITAL_COMMUNITY)
Admission: RE | Admit: 2017-08-05 | Discharge: 2017-08-05 | Disposition: A | Discharge status: Home / Self Care | Payer: Self-pay | Source: Ambulatory Visit | Attending: Internal Medicine | Admitting: Internal Medicine

## 2017-08-05 ENCOUNTER — Encounter (HOSPITAL_COMMUNITY)
Admission: RE | Disposition: A | Discharge status: Home / Self Care | Payer: Self-pay | Source: Ambulatory Visit | Attending: Internal Medicine

## 2017-08-05 ENCOUNTER — Other Ambulatory Visit: Payer: Self-pay

## 2017-08-05 DIAGNOSIS — R194 Change in bowel habit: Secondary | ICD-10-CM | POA: Insufficient documentation

## 2017-08-05 DIAGNOSIS — Z7722 Contact with and (suspected) exposure to environmental tobacco smoke (acute) (chronic): Secondary | ICD-10-CM | POA: Insufficient documentation

## 2017-08-05 DIAGNOSIS — R195 Other fecal abnormalities: Secondary | ICD-10-CM

## 2017-08-05 DIAGNOSIS — K635 Polyp of colon: Secondary | ICD-10-CM | POA: Insufficient documentation

## 2017-08-05 HISTORY — PX: POLYPECTOMY: SHX5525

## 2017-08-05 HISTORY — PX: COLONOSCOPY: SHX5424

## 2017-08-05 SURGERY — COLONOSCOPY
Anesthesia: Moderate Sedation

## 2017-08-05 MED ORDER — MEPERIDINE HCL 100 MG/ML IJ SOLN
INTRAMUSCULAR | Status: AC
  Filled 2017-08-05: qty 2

## 2017-08-05 MED ORDER — MIDAZOLAM HCL 5 MG/5ML IJ SOLN
INTRAMUSCULAR | Status: DC | PRN
  Administered 2017-08-05: 1 mg via INTRAVENOUS
  Administered 2017-08-05: 2 mg via INTRAVENOUS
  Administered 2017-08-05 (×2): 1 mg via INTRAVENOUS

## 2017-08-05 MED ORDER — ONDANSETRON HCL 4 MG/2ML IJ SOLN
INTRAMUSCULAR | Status: DC | PRN
  Administered 2017-08-05: 4 mg via INTRAVENOUS

## 2017-08-05 MED ORDER — SODIUM CHLORIDE 0.9 % IV SOLN
INTRAVENOUS | Status: DC
  Administered 2017-08-05: 08:00:00 via INTRAVENOUS

## 2017-08-05 MED ORDER — ONDANSETRON HCL 4 MG/2ML IJ SOLN
INTRAMUSCULAR | Status: AC
  Filled 2017-08-05: qty 2

## 2017-08-05 MED ORDER — MEPERIDINE HCL 100 MG/ML IJ SOLN
INTRAMUSCULAR | Status: DC | PRN
  Administered 2017-08-05 (×2): 25 mg via INTRAVENOUS

## 2017-08-05 MED ORDER — MIDAZOLAM HCL 5 MG/5ML IJ SOLN
INTRAMUSCULAR | Status: AC
  Filled 2017-08-05: qty 10

## 2017-08-05 NOTE — Discharge Instructions (Signed)
Colonoscopy Discharge Instructions  Read the instructions outlined below and refer to this sheet in the next few weeks. These discharge instructions provide you with general information on caring for yourself after you leave the hospital. Your doctor may also give you specific instructions. While your treatment has been planned according to the most current medical practices available, unavoidable complications occasionally occur. If you have any problems or questions after discharge, call Dr. Jena Gaussourk at [phone]. ACTIVITY  You may resume your regular activity, but move at a slower pace for the next 24 hours.   Take frequent rest periods for the next 24 hours.   Walking will help get rid of the air and reduce the bloated feeling in your belly (abdomen).   No driving for 24 hours (because of the medicine (anesthesia) used during the test).    Do not sign any important legal documents or operate any machinery for 24 hours (because of the anesthesia used during the test).  NUTRITION  Drink plenty of fluids.   You may resume your normal diet as instructed by your doctor.   Begin with a light meal and progress to your normal diet. Heavy or fried foods are harder to digest and may make you feel sick to your stomach (nauseated).   Avoid alcoholic beverages for 24 hours or as instructed.  MEDICATIONS  You may resume your normal medications unless your doctor tells you otherwise.  WHAT YOU CAN EXPECT TODAY  Some feelings of bloating in the abdomen.   Passage of more gas than usual.   Spotting of blood in your stool or on the toilet paper.  IF YOU HAD POLYPS REMOVED DURING THE COLONOSCOPY:  No aspirin products for 7 days or as instructed.   No alcohol for 7 days or as instructed.   Eat a soft diet for the next 24 hours.  FINDING OUT THE RESULTS OF YOUR TEST Not all test results are available during your visit. If your test results are not back during the visit, make an appointment  with your caregiver to find out the results. Do not assume everything is normal if you have not heard from your caregiver or the medical facility. It is important for you to follow up on all of your test results.  SEEK IMMEDIATE MEDICAL ATTENTION IF:  You have more than a spotting of blood in your stool.   Your belly is swollen (abdominal distention).   You are nauseated or vomiting.   You have a temperature over 101.   You have abdominal pain or discomfort that is severe or gets worse throughout the day.    Colon polyp information provided  Stop psyllium; begin Benefiber 2 tablespoons each morning x 3 weeks; then increase to 2 tablespoons twice daily thereafter  Office visit with us in 2 months  Further recommendations to follow pending review of pathology report     Colon Polyps Polyps are tissue growths inside the body. Polyps can grow in many places, including the large intestine (colon). A polyp may be a round bump or a mushroom-shaped growth. You could have one polyp or several. Most colon polyps are noncancerous (benign). However, some colon polyps can become cancerous over time. What are the causes? The exact cause of colon polyps is not known. What increases the risk? This condition is more likely to develop in people who:  Have a family history of colon cancer or colon polyps.  Are older than 7250 or older than 45 if they are African  American.  Have inflammatory bowel disease, such as ulcerative colitis or Crohn disease.  Are overweight.  Smoke cigarettes.  Do not get enough exercise.  Drink too much alcohol.  Eat a diet that is: ? High in fat and red meat. ? Low in fiber.  Had childhood cancer that was treated with abdominal radiation.  What are the signs or symptoms? Most polyps do not cause symptoms. If you have symptoms, they may include:  Blood coming from your rectum when having a bowel movement.  Blood in your stool.The stool may look dark red  or black.  A change in bowel habits, such as constipation or diarrhea.  How is this diagnosed? This condition is diagnosed with a colonoscopy. This is a procedure that uses a lighted, flexible scope to look at the inside of your colon. How is this treated? Treatment for this condition involves removing any polyps that are found. Those polyps will then be tested for cancer. If cancer is found, your health care provider will talk to you about options for colon cancer treatment. Follow these instructions at home: Diet  Eat plenty of fiber, such as fruits, vegetables, and whole grains.  Eat foods that are high in calcium and vitamin D, such as milk, cheese, yogurt, eggs, liver, fish, and broccoli.  Limit foods high in fat, red meats, and processed meats, such as hot dogs, sausage, bacon, and lunch meats.  Maintain a healthy weight, or lose weight if recommended by your health care provider. General instructions  Do not smoke cigarettes.  Do not drink alcohol excessively.  Keep all follow-up visits as told by your health care provider. This is important. This includes keeping regularly scheduled colonoscopies. Talk to your health care provider about when you need a colonoscopy.  Exercise every day or as told by your health care provider. Contact a health care provider if:  You have new or worsening bleeding during a bowel movement.  You have new or increased blood in your stool.  You have a change in bowel habits.  You unexpectedly lose weight. This information is not intended to replace advice given to you by your health care provider. Make sure you discuss any questions you have with your health care provider. Document Released: 10/08/2003 Document Revised: 06/19/2015 Document Reviewed: 12/02/2014 Elsevier Interactive Patient Education  [employer].

## 2017-08-05 NOTE — H&P (Signed)
 @LOGO @   Primary Care Physician:  Jacquelin HawkingMcElroy, Shannon, PA-C Primary Gastroenterologist:  Dr. Jena Gaussourk  Pre-Procedure History & Physical: HPI:  Logan PaulsLuis Bush is a 57 y.o. male here for increased mucus in stool;  Denies diarrhea. No bleeding. Takes psyllium daily.  Past Medical History:  Diagnosis Date  . Allergy   . Vertigo     Past Surgical History:  Procedure Laterality Date  . POLYPECTOMY  10/2012    Prior to Admission medications   Medication Sig Start Date End Date Taking? Authorizing Provider  psyllium (METAMUCIL) 58.6 % packet Take 1 packet by mouth daily as needed (for constipation).   Yes [provider]  meclizine (ANTIVERT) 25 MG tablet 1 tablet po q 8 hour prn dizziness.  1 tableta por boca cada 8 horas cuando sea necesario para el mareo Patient not taking: Reported on 08/02/2017 04/05/17   Jacquelin HawkingMcElroy, Shannon, PA-C    Allergies as of 05/12/2017 - Review Complete 05/12/2017  Allergen Reaction Noted  . Aspirin  10/14/2015    Family History  Problem Relation Age of Onset  . Heart attack Mother   . Hypertension Mother   . Cancer Father        prostate cancer  . Asthma Father   . Cancer Sister        breast cancer  . Cancer Maternal Grandmother        Breast Cancer  . Colon cancer Neg Hx   . Colon polyps Neg Hx     Social History   Socioeconomic History  . Marital status: Married    Spouse name: Not on file  . Number of children: Not on file  . Years of education: Not on file  . Highest education level: Not on file  Occupational History  . Not on file  Social Needs  . Financial resource strain: Not on file  . Food insecurity:    Worry: Not on file    Inability: Not on file  . Transportation needs:    Medical: Not on file    Non-medical: Not on file  Tobacco Use  . Smoking status: Passive Smoke Exposure - Never Smoker  . Smokeless tobacco: Never Used  Substance and Sexual Activity  . Alcohol use: No  . Drug use: No  . Sexual activity: Not on file   Lifestyle  . Physical activity:    Days per week: Not on file    Minutes per session: Not on file  . Stress: Not on file  Relationships  . Social connections:    Talks on phone: Not on file    Gets together: Not on file    Attends religious service: Not on file    Active member of club or organization: Not on file    Attends meetings of clubs or organizations: Not on file    Relationship status: Not on file  . Intimate partner violence:    Fear of current or ex partner: Not on file    Emotionally abused: Not on file    Physically abused: Not on file    Forced sexual activity: Not on file  Other Topics Concern  . Not on file  Social History Narrative  . Not on file    Review of Systems: See HPI, otherwise negative ROS  Physical Exam: BP 127/83   Pulse 64   Temp 99.2 F (37.3 C) (Oral)   Resp 16   Ht 5\' 7"  (1.702 m)   Wt 180 lb (81.6 kg)   SpO2  97%   BMI 28.19 kg/m  General:   Alert,  Well-developed, well-nourished, pleasant and cooperative in NAD Neck:  Supple; no masses or thyromegaly. No significant cervical adenopathy. Lungs:  Clear throughout to auscultation.   No wheezes, crackles, or rhonchi. No acute distress. Heart:  Regular rate and rhythm; no murmurs, clicks, rubs,  or gallops. Abdomen: Non-distended, normal bowel sounds.  Soft and nontender without appreciable mass or hepatosplenomegaly.  Pulses:  Normal pulses noted. Extremities:  Without clubbing or edema.  Impression/Plan:  57 year old gentleman here for colonoscopy due to change in bowel habits. Recent right upper quadrant pain workup with a gallbladder ultrasound HIDA-both of which came back normal. The risks, benefits, limitations, alternatives and imponderables have been reviewed with the patient. Questions have been answered. All parties are agreeable.    Notice: This dictation was prepared with Dragon dictation along with smaller phrase technology. Any transcriptional errors that result from this  process are unintentional and may not be corrected upon review.

## 2017-08-05 NOTE — Op Note (Signed)
[facility] Patient Name: Logan Bush Procedure Date: 08/05/2017 8:39 AM MRN: [id number] Date of Birth: 1960-06-06 Attending MD: Gennette Pac , MD CSN: [id number] Age: 57 Admit Type: Outpatient Procedure:                Colonoscopy Indications:              Change in bowel habits Providers:                Gennette Pac, MD, Buel Ream. Thomasena Edis RN, RN,                            Dyann Ruddle Referring MD:              Medicines:                Midazolam 5 mg IV, Meperidine 50 mg IV Complications:            No immediate complications. Estimated Blood Loss:     Estimated blood loss was minimal. Procedure:                Pre-Anesthesia Assessment:                           - Prior to the procedure, a History and Physical                            was performed, and patient medications and                            allergies were reviewed. The patient's tolerance of                            previous anesthesia was also reviewed. The risks                            and benefits of the procedure and the sedation                            options and risks were discussed with the patient.                            All questions were answered, and informed consent                            was obtained. Prior Anticoagulants: The patient has                            taken no previous anticoagulant or antiplatelet                            agents. ASA Grade Assessment: II - A patient with                            mild systemic disease. After reviewing the risks  and benefits, the patient was deemed in                            satisfactory condition to undergo the procedure.                           After obtaining informed consent, the colonoscope                            was passed under direct vision. Throughout the                            procedure, the patient's blood pressure, pulse, and                            oxygen  saturations were monitored continuously. The                            CF-[id number] ([id number] scope was introduced through                            the anus and advanced to the 5 cm into the ileum.                            The colonoscopy was performed without difficulty.                            The patient tolerated the procedure well. The                            quality of the bowel preparation was adequate. The                            terminal ileum, ileocecal valve, appendiceal                            orifice, and rectum were photographed. The entire                            colon was well visualized. Scope In: 9:03:19 AM Scope Out: 9:13:57 AM Scope Withdrawal Time: 0 hours 8 minutes 8 seconds  Total Procedure Duration: 0 hours 10 minutes 38 seconds  Findings:      The perianal and digital rectal examinations were normal. Distal 5 cm of       terminal ileum appeared normal.      A 5 mm polyp was found in the descending colon. The polyp was       semi-pedunculated. The polyp was removed with a cold snare. Resection       and retrieval were complete. Estimated blood loss was minimal.      The exam was otherwise without abnormality on direct and retroflexion       views. Impression:               - One 5 mm polyp in the descending colon, removed  with a cold snare. Resected and retrieved.                           - The examination was otherwise normal on direct                            and retroflexion views. Moderate Sedation:      Moderate (conscious) sedation was personally administered by an       anesthesia professional. The following parameters were monitored: oxygen       saturation, heart rate, blood pressure, respiratory rate, EKG, adequacy       of pulmonary ventilation, and response to care. Total physician       intraservice time was 17 minutes. Recommendation:           - Patient has a contact number available for                             emergencies. The signs and symptoms of potential                            delayed complications were discussed with the                            patient. Return to normal activities tomorrow.                            Written discharge instructions were provided to the                            patient.                           - Resume previous diet.                           - Continue present medications. stop psyllium.                            Begin Benefiber 2 tablespoons daily -3 weeks; then                            increase to 2 tablespoons twice daily thereafter.                           - Await pathology results.                           - Repeat colonoscopy date to be determined after                            pending pathology results are reviewed for                            surveillance.                           -  Return to GI office in 2 months. 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 All rights reserved. The codes documented in this report are preliminary and upon coder review may  be revised to meet current compliance requirements. Gerrit Friends. Saraya Tirey, MD Gennette Pac, MD 08/05/2017 9:18:36 AM This report has been signed electronically. Number of Addenda: 0

## 2017-08-10 ENCOUNTER — Encounter (HOSPITAL_COMMUNITY): Payer: Self-pay | Admitting: Internal Medicine

## 2017-08-12 ENCOUNTER — Encounter: Payer: Self-pay | Admitting: Internal Medicine

## 2017-08-26 ENCOUNTER — Telehealth: Payer: Self-pay

## 2017-08-26 DIAGNOSIS — R109 Unspecified abdominal pain: Secondary | ICD-10-CM

## 2017-08-26 NOTE — Telephone Encounter (Signed)
   Letter should be arriving to patient shortly if not already. The polyp was so small that processing was difficult. There is no mention of any pre-cancerous polyp in the pathology report. I am not sure where son received that information. He may not remember being informed about the polyp after the procedure due to sedating effects, but this has already been addressed in the letter.   Chest pains, dizziness, clamminess deserve medical evaluation (ED) if occurring currently. I do not see where he has seen a cardiologist. To ED if any of those further symptoms.   Thus far, we have ruled out any biliary etiology for abdominal pain. He has no typical GERD symptoms. No postprandial symptoms.   I recommend a CT abd/pelvis with contrast due to abdominal pain. Try cutting Metamucil in half to see if this is helpful. I do note that he has constipation without the supplemental fiber. We may need to do a different agent (would favor Amitiza low dose over Linzess in this setting).

## 2017-08-26 NOTE — Telephone Encounter (Signed)
RMR, Pt walked in to discuss his pathology report. Pt has symptoms that he is concerned with. He has felt dizziness twice with clamminess, chest pain. Pt [facility] and has had a cardiology evaluation. Pts evaluation seems to be clear each time he's seen cardiology per pt. Pt continues to have 6-7 bowel movements daily. Pt feels he has no control over the bowel movements. Bowel movements start off normal and end up yellowish(mucous) and loose. Pt has to sleep on his left side at night due to the pain he gets on his right side. Pain level gets to an 8 with 10 being the highest scale. Pain comes on before bowel movements or without bowel movements. PT drinks Metamucil ever morning (1 small spoon is mixed with water). When pt has gone 1-2 days without the Metamucil, he feels he can't have a bowel movement.  Pt was scheduled for 10/13/17 with [facility] and wasn't aware of that appointment. Pt is aware now and wants to be seen before that date. He feels that something is wrong inside and he doesn't understand why he wasn't told about his polyp after his procedure. Pt says his son was notified that it was precancerous and he's concerned.   Pt is aware that if his symptoms worsen, rectal bleeding starts, chest pains, sweating, vomiting ect, he needs to proceed to the ED.  PT apologizes that his english isn't that good and was accompanied by his son who speaks english.

## 2017-08-29 NOTE — Telephone Encounter (Signed)
Lmom, waiting on a return call.  

## 2017-08-30 NOTE — Telephone Encounter (Signed)
Tried calling pts home and cell number. No answer on home number. Tried calling pts son with no answer, will call back.

## 2017-08-30 NOTE — Telephone Encounter (Signed)
Spoke with pts son. He is is going to have his father cut the Metamucil in half. He also wants our office to call him at [phone] to set up the CT scan. His name is Logan Bush.

## 2017-08-30 NOTE — Telephone Encounter (Signed)
Called spoke with son and is aware CT scheduled for 09/12/17, arrival time 9:45am, npo 4 hrs prior. Aware [facility]

## 2017-08-30 NOTE — Addendum Note (Signed)
Addended by: Tommie SamsSILVA, Salena Ortlieb S on: 08/30/2017 11:51 AM   Modules accepted: Orders

## 2017-08-30 NOTE — Telephone Encounter (Signed)
Pt returned call and isn't able to understand everything that is being said. Pt is going to have his son call back so we can discuss things.

## 2017-09-12 ENCOUNTER — Ambulatory Visit (HOSPITAL_COMMUNITY)
Admission: RE | Admit: 2017-09-12 | Discharge: 2017-09-12 | Disposition: A | Discharge status: Home / Self Care | Payer: Self-pay | Source: Ambulatory Visit | Attending: Gastroenterology | Admitting: Gastroenterology

## 2017-09-12 DIAGNOSIS — R109 Unspecified abdominal pain: Secondary | ICD-10-CM | POA: Insufficient documentation

## 2017-09-12 IMAGING — CT CT ABD-PELV W/ CM
2 of 5 series · 17 of 46 positions shown, 19 images · IV contrast (Isovue)
Comparison: Abdominal ultrasound dated [DATE].

CLINICAL DATA: Chronic right upper quadrant pain for the past year
and a half.

EXAM:
CT ABDOMEN AND PELVIS WITH CONTRAST
TECHNIQUE: Multidetector CT imaging of the abdomen and pelvis was performed
using the standard protocol following bolus administration of
intravenous contrast.
CONTRAST:  100mL [14] IOPAMIDOL ([14]) INJECTION 61%

[Series 2: axial st · axial · 0.73mm/px · z∈[+898,+1324]mm · 14 of 95 slices shown, 16 images]
[im 5/95  soft-tissue]
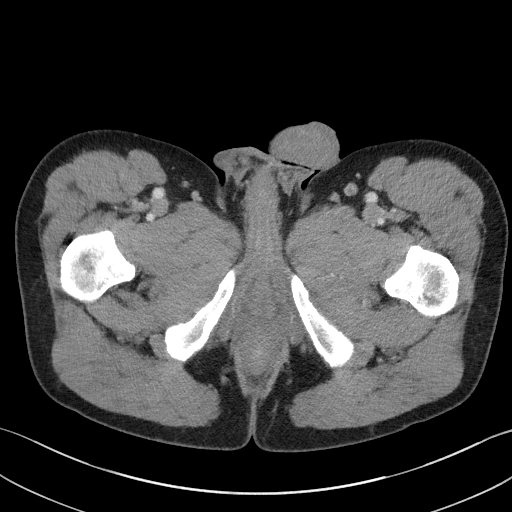
[im 5/95  bone]
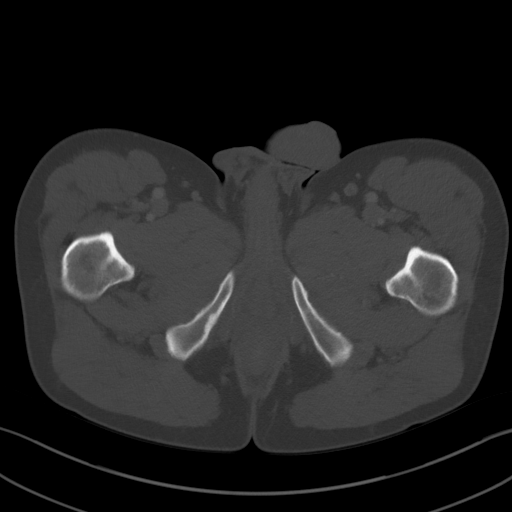
[im 10/95  soft-tissue]
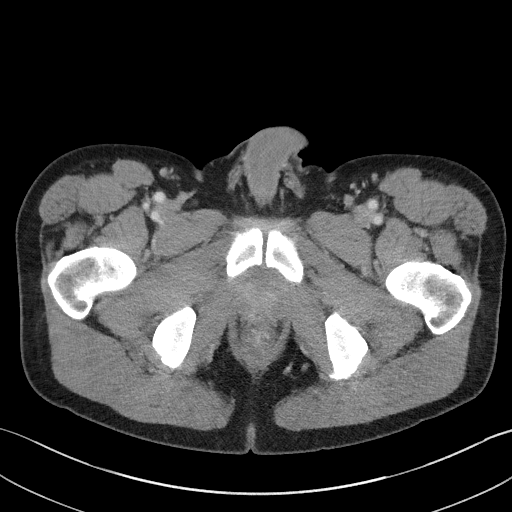
[im 20/95  soft-tissue]
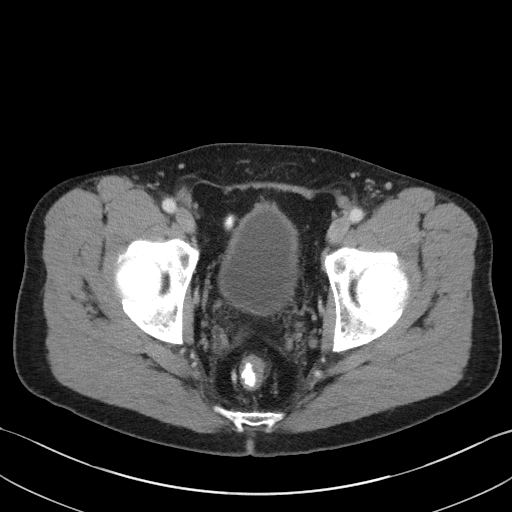
[im 25/95  soft-tissue]
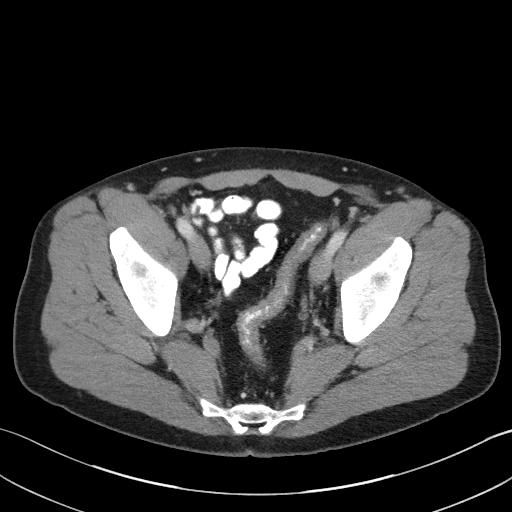
[im 30/95  soft-tissue]
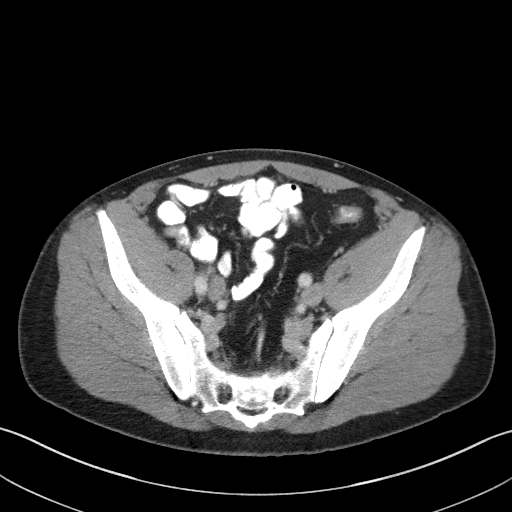
[im 40/95  soft-tissue]
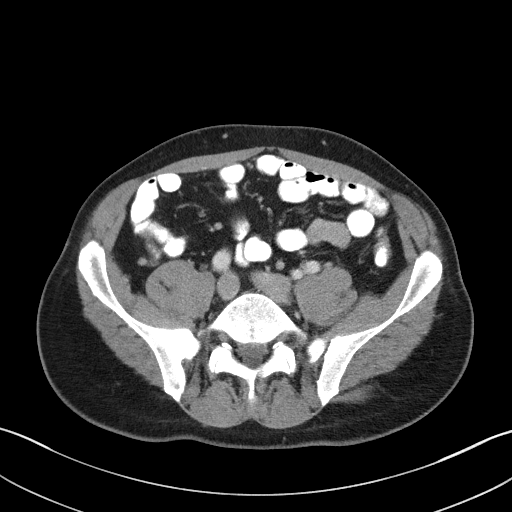
[im 45/95  soft-tissue]
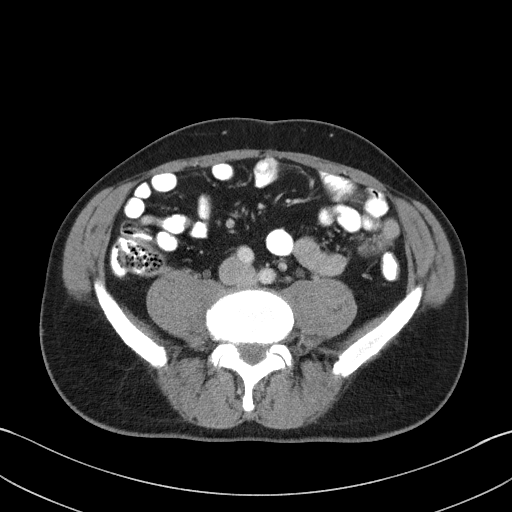
[im 50/95  soft-tissue]
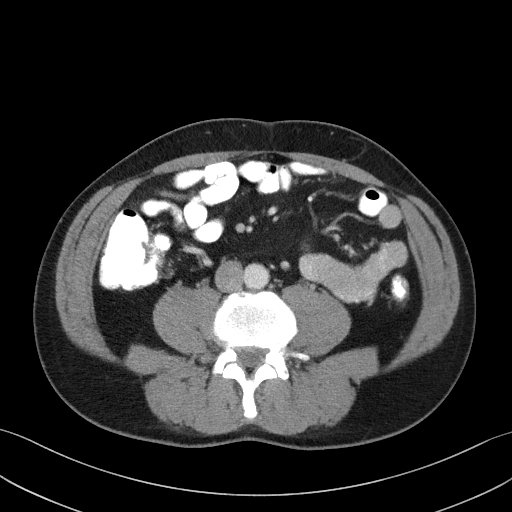
[im 55/95  soft-tissue]
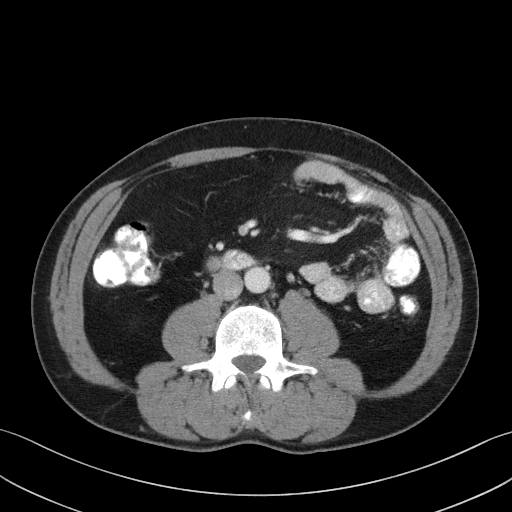
[im 55/95  bone]
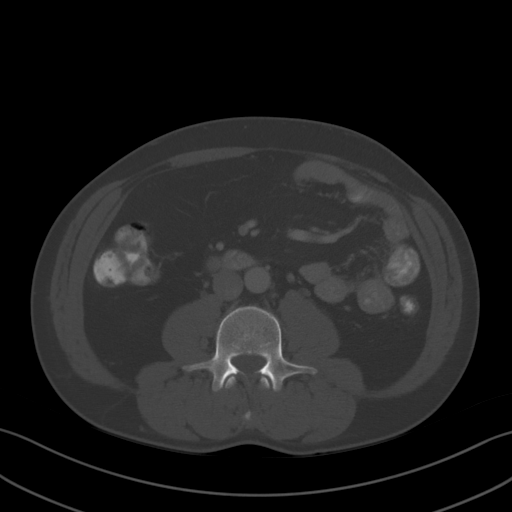
[im 65/95  soft-tissue]
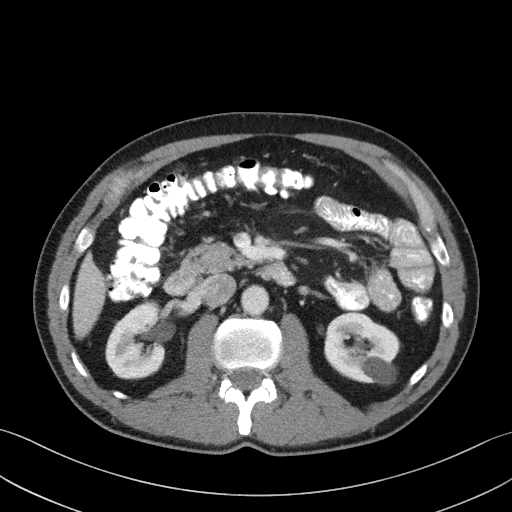
[im 70/95  soft-tissue]
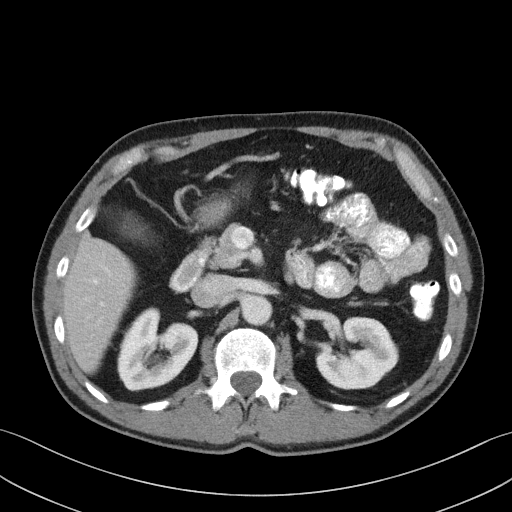
[im 75/95  soft-tissue]
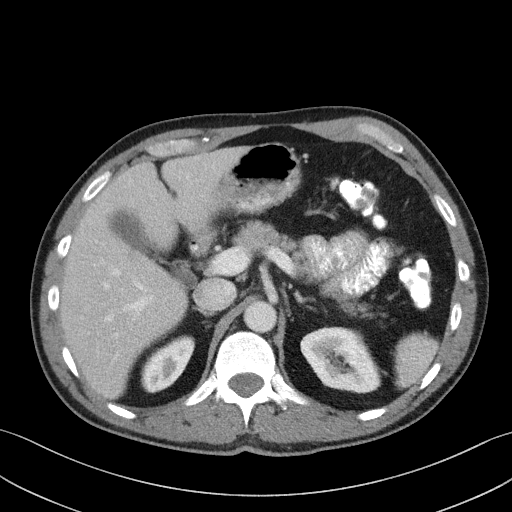
[im 85/95  soft-tissue]
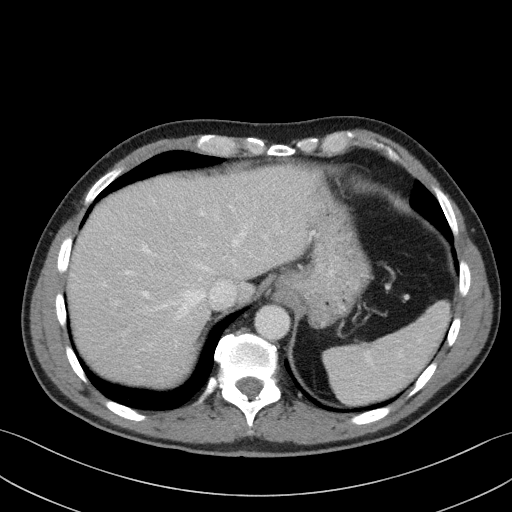
[im 90/95  soft-tissue]
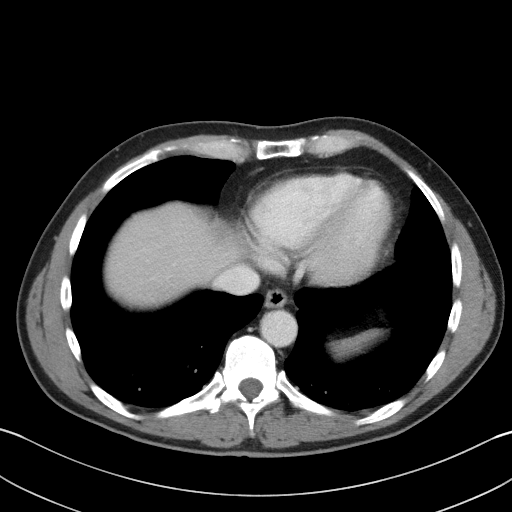

[Series 6: coronal st · coronal · 0.82mm/px · 3 of 86 slices shown]
[im 29/86  soft-tissue]
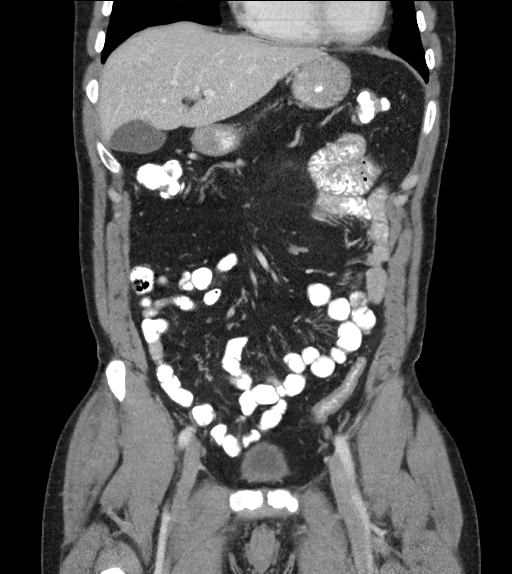
[im 38/86  soft-tissue]
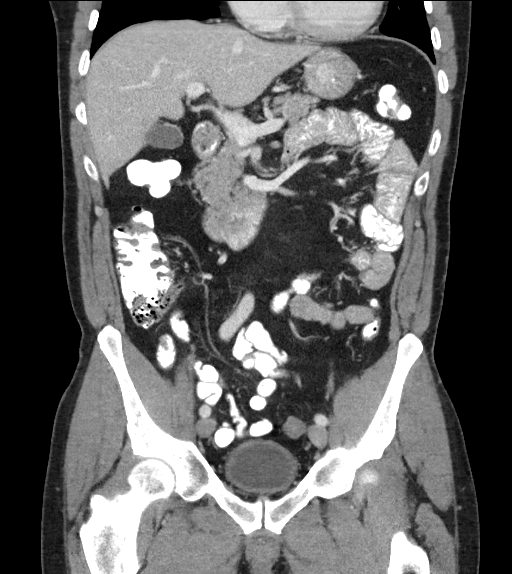
[im 48/86  soft-tissue]
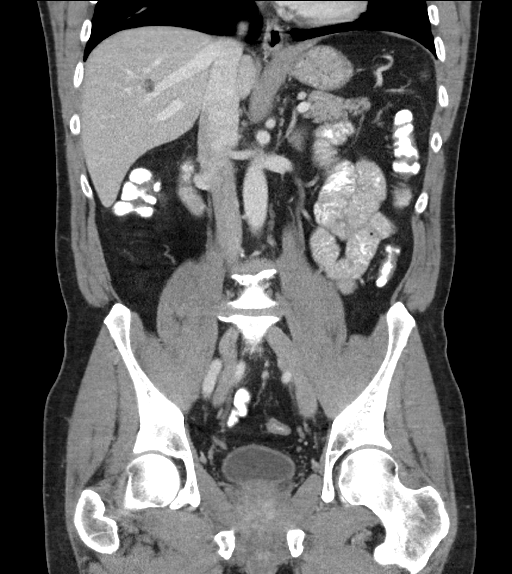

[17 of 46 positions shown; findings below may reference images not displayed]

FINDINGS: Lower chest: No acute abnormality.

Hepatobiliary: 1.2 cm simple cyst in the right hepatic lobe. The
gallbladder is unremarkable. No biliary dilatation.

Pancreas: Unremarkable. No pancreatic ductal dilatation or
surrounding inflammatory changes.

Spleen: Normal in size without focal abnormality.

Adrenals/Urinary Tract: Adrenal glands are unremarkable. 2.6 cm
simple cyst in the left kidney. No other focal renal lesion. No
renal or ureteral calculi. No hydronephrosis. The bladder is
unremarkable.

Stomach/Bowel: Stomach is within normal limits. Appendix appears
normal. No evidence of bowel wall thickening, distention, or
inflammatory changes.

Vascular/Lymphatic: No significant vascular findings are present. No
enlarged abdominal or pelvic lymph nodes.

Reproductive: Prostate is unremarkable.

Other: No abdominal wall hernia or abnormality. No abdominopelvic
ascites. No pneumoperitoneum.

Musculoskeletal: No acute or significant osseous findings. Mild
degenerative disc disease at L4-L5.
IMPRESSION: 1. No acute intra-abdominal process. No explanation for the
patient's symptoms.

## 2017-09-12 MED ORDER — IOPAMIDOL (ISOVUE-300) INJECTION 61%
100.0000 mL | Freq: Once | INTRAVENOUS | Status: AC | PRN
  Administered 2017-09-12: 100 mL via INTRAVENOUS

## 2017-09-13 NOTE — Progress Notes (Signed)
CT reviewed. No acute process. Stomach appears normal. No explanation for RUQ pain. How is he doing? The only thing we have not done is an EGD. I am not sure this will be of great yield, but we can discuss that when he comes in September.

## 2017-09-15 NOTE — Progress Notes (Signed)
Noted. Please see if he can keep a log of this and a diet log.

## 2017-10-13 ENCOUNTER — Encounter: Payer: Self-pay | Admitting: Nurse Practitioner

## 2017-10-13 ENCOUNTER — Ambulatory Visit (INDEPENDENT_AMBULATORY_CARE_PROVIDER_SITE_OTHER): Payer: Self-pay | Admitting: Nurse Practitioner

## 2017-10-13 VITALS — BP 131/92 | HR 68 | Temp 97.0°F | Ht 66.0 in | Wt 183.8 lb

## 2017-10-13 DIAGNOSIS — R194 Change in bowel habit: Secondary | ICD-10-CM

## 2017-10-13 DIAGNOSIS — R1084 Generalized abdominal pain: Secondary | ICD-10-CM

## 2017-10-13 DIAGNOSIS — R109 Unspecified abdominal pain: Secondary | ICD-10-CM | POA: Insufficient documentation

## 2017-10-13 NOTE — Assessment & Plan Note (Signed)
The patient previously complained of right upper quadrant pain.  Currently seems his pain is more right lower quadrant/left lower quadrant/generalized.  On exam today he does have some mild left lower quadrant tenderness but no right-sided or right upper quadrant pain.  This is associated with frequent stools up to 6-8 a day which are sometimes/often loose.  CT is reassuring.  Colonoscopy reassuring.  Stool studies as per above.  Further recommendation to follow.  Follow-up in 2 months.

## 2017-10-13 NOTE — Assessment & Plan Note (Addendum)
The patient is currently having frequent bowel movements, up to 8 a day.  If he has more than 2 bowel movements in a day they will be frequently loose.  He does have some abdominal pain which starts sometime after having a bowel movement and it triggers him to return to the bathroom and at this point he will have another bowel movement which is often loose.  Stool studies do not appear to have been completed yet.  I will check stool studies for any occult infection.  CT and colonoscopy are reassuring.  I do not feel we need upper endoscopy at this time as his pain is not really right upper quadrant.  I have reviewed his dietary recall list he brought to our office which we will scan into the record.  I do not feel there is a likely dietary component after reviewing this.  I feel he likely has a component of IBS and will benefit from symptomatic management with medications pending stool studies.  Follow-up in 2 months.  Further recommendations to follow.

## 2017-10-13 NOTE — Progress Notes (Signed)
CC'ED TO PCP 

## 2017-10-13 NOTE — Progress Notes (Signed)
Referring Provider: Jacquelin HawkingMcElroy, Shannon, PA-C Primary Care Physician:  Jacquelin HawkingMcElroy, Shannon, PA-C Primary GI:  Dr. Jena Gaussourk  Chief Complaint  Patient presents with  . change in bowel habits    pp f/u. BM's are 6-8 times per day at minimum. Sometimes watery yellow    HPI:   Logan Bush is a 57 y.o. male who presents for post procedure follow-up.  Patient was last seen in our office 05/12/2017 for change in bowel habits and right upper quadrant pain.  [place], procedure note not available but surgical pathology noted "hyperplastic".  Right upper quadrant pain typically postprandially, nausea but no vomiting.  No reflux.  Stools in the morning sometimes soft, sometimes dry, sometimes mucus-like but does not feel constipated.  This started about a year and a half ago.  Takes Metamucil daily.  Recommended repeat colonoscopy, right upper quadrant ultrasound.  Right upper quadrant ultrasound completed 06/06/2017 which was normal.  Colonoscopy completed 08/05/2017 which found a single 5 mm polyp in the descending colon, otherwise normal.  Pathology found the polyp to be fecal material with no mucosa present (essentially the polyp did not survive processing because of its small size).  Recommended Benefiber 2 teaspoons daily for 3 weeks and increase to 2 teaspoons twice daily after that.  Repeat colonoscopy in 10 years, office follow-up in 2 months.  Patient came to our office complaining of further symptoms occluding persistent right upper quadrant pain, 6-7 bowel movements daily with no control over them varying from normal to loose.  Pain comes before bowel movements or without bowel movements.  Taking Metamucil.  Recommend he cut Metamucil and a half, CT of the abdomen and pelvis.  The abdomen and pelvis was completed 09/12/2017 which found no acute intra-abdominal process and no explanation for the patient's symptoms.  Patient was given results.  Query possible EGD for right upper  quadrant pain which can be discussed at his follow-up appointment.  Requested the patient keep a diet log.  Today he states he's doing ok overall. Since his colonoscopy is having 6-8 bowel movements a day which are sometimes yellow-watery. Metamucil is as needed and hasn't taken since. He is still having frequent stools, sometimes watery and fecal incontinence. He states if he has ore than 2 bowel movements they will become watery and mucousy. Denies hematochezia, melena. Still with occasional right-sided pain after having a bowel movement at which point he will have to return to the bathroom for another bowel movement. Pain is right-lower to right-sided, not RUQ. Occasional nausea as well. No vomiting. Pain is described as like a "tooth ache in the stomach." Primarily drinks bottled water. Denies fever, chills, unintentional weight loss. Cannot name a particular trigger for his symptoms (no food triggers).   Past Medical History:  Diagnosis Date  . Allergy   . Vertigo     Past Surgical History:  Procedure Laterality Date  . COLONOSCOPY N/A 08/05/2017   Procedure: COLONOSCOPY;  Surgeon: Corbin Adeourk, Robert M, MD;  [facility]: [facility];  Service: Endoscopy;  Laterality: N/A;  1:45pm-rescheduled to 7/12 @9 :30am per Darlina RumpfMartina  . POLYPECTOMY  10/2012  . POLYPECTOMY  08/05/2017   Procedure: POLYPECTOMY;  Surgeon: Corbin Adeourk, Robert M, MD;  Location: [facility];  Service: Endoscopy;;    Current Outpatient Medications  Medication Sig Dispense Refill  . psyllium (METAMUCIL) 58.6 % packet Take 1 packet by mouth daily as needed (for constipation).     No current facility-administered medications for this  visit.     Allergies as of 10/13/2017 - Review Complete 10/13/2017  Allergen Reaction Noted  . Aspirin Other (See Comments) 10/14/2015    Family History  Problem Relation Age of Onset  . Heart attack Mother   . Hypertension Mother   . Cancer Father        prostate cancer  . Asthma Father   .  Cancer Sister        breast cancer  . Cancer Maternal Grandmother        Breast Cancer  . Colon cancer Neg Hx   . Colon polyps Neg Hx     Social History   Socioeconomic History  . Marital status: Married    Spouse name: Not on file  . Number of children: Not on file  . Years of education: Not on file  . Highest education level: Not on file  Occupational History  . Not on file  Social Needs  . Financial resource strain: Not on file  . Food insecurity:    Worry: Not on file    Inability: Not on file  . Transportation needs:    Medical: Not on file    Non-medical: Not on file  Tobacco Use  . Smoking status: Passive Smoke Exposure - Never Smoker  . Smokeless tobacco: Never Used  Substance and Sexual Activity  . Alcohol use: No  . Drug use: No  . Sexual activity: Not on file  Lifestyle  . Physical activity:    Days per week: Not on file    Minutes per session: Not on file  . Stress: Not on file  Relationships  . Social connections:    Talks on phone: Not on file    Gets together: Not on file    Attends religious service: Not on file    Active member of club or organization: Not on file    Attends meetings of clubs or organizations: Not on file    Relationship status: Not on file  Other Topics Concern  . Not on file  Social History Narrative  . Not on file    Review of Systems: General: Negative for anorexia, weight loss, fever, chills, fatigue, weakness. ENT: Negative for hoarseness, difficulty swallowing. CV: Negative for chest pain, angina, palpitations, peripheral edema.  Respiratory: Negative for dyspnea at rest, cough, sputum, wheezing.  GI: See history of present illness. Endo: Negative for unusual weight change.  Heme: Negative for bruising or bleeding.   Physical Exam: BP (!) 131/92   Pulse 68   Temp (!) 97 F (36.1 C) (Oral)   Ht 5\' 6"  (1.676 m)   Wt 183 lb 12.8 oz (83.4 kg)   BMI 29.67 kg/m  General:   Alert and oriented. Pleasant and  cooperative. Well-nourished and well-developed.  Eyes:  Without icterus, sclera clear and conjunctiva pink.  Ears:  Normal auditory acuity. Cardiovascular:  S1, S2 present without murmurs appreciated. Extremities without clubbing or edema. Respiratory:  Clear to auscultation bilaterally. No wheezes, rales, or rhonchi. No distress.  Gastrointestinal:  +BS, soft, and non-distended. Noted mild LLQ abdominal TTP. No HSM noted. No guarding or rebound. No masses appreciated.  Rectal:  Deferred  Musculoskalatal:  Symmetrical without gross deformities. Neurologic:  Alert and oriented x4;  grossly normal neurologically. Psych:  Alert and cooperative. Normal mood and affect. Heme/Lymph/Immune: No excessive bruising noted.    10/13/2017 11:17 AM   Disclaimer: This note was dictated with voice recognition software. Similar sounding words can inadvertently be transcribed and  may not be corrected upon review.

## 2017-10-13 NOTE — Patient Instructions (Signed)
1. Bring your stool samples to the lab when you collect them. 2. We will call you with the results. 3. Depending on your results, we will call with further recommendations about possible medications. 4. Return for follow-up in 2 months. 5. Call us if you have any questions or concerns.  At [facility] we value your feedback. You may receive a survey about your visit today. Please share your experience as we strive to create trusting relationships with our patients to provide genuine, compassionate, quality care.  We appreciate your understanding and patience as we review any laboratory studies, imaging, and other diagnostic tests that are ordered as we care for you. Our office policy is 5 business days for review of these results, and any emergent or urgent results are addressed in a timely manner for your best interest. If you do not hear from our office in 1 week, please contact us.   We also encourage the use of MyChart, which contains your medical information for your review as well. If you are not enrolled in this feature, an access code is on this after visit summary for your convenience. Thank you for allowing us to be involved in your care.  It was great to meet you today!  I hope you have a great Fall!!

## 2017-10-31 ENCOUNTER — Ambulatory Visit: Payer: Self-pay | Admitting: Physician Assistant

## 2017-11-15 ENCOUNTER — Encounter: Payer: Self-pay | Admitting: Physician Assistant

## 2018-01-06 ENCOUNTER — Ambulatory Visit: Payer: Self-pay | Admitting: Nurse Practitioner

## 2018-02-04 ENCOUNTER — Emergency Department (HOSPITAL_COMMUNITY)
Admission: EM | Admit: 2018-02-04 | Discharge: 2018-02-04 | Disposition: A | Discharge status: Home / Self Care | Payer: Self-pay | Attending: Emergency Medicine | Admitting: Emergency Medicine

## 2018-02-04 ENCOUNTER — Emergency Department (HOSPITAL_COMMUNITY): Payer: Self-pay

## 2018-02-04 ENCOUNTER — Other Ambulatory Visit: Payer: Self-pay

## 2018-02-04 ENCOUNTER — Encounter (HOSPITAL_COMMUNITY): Payer: Self-pay | Admitting: Emergency Medicine

## 2018-02-04 DIAGNOSIS — Z7722 Contact with and (suspected) exposure to environmental tobacco smoke (acute) (chronic): Secondary | ICD-10-CM | POA: Insufficient documentation

## 2018-02-04 DIAGNOSIS — R0789 Other chest pain: Secondary | ICD-10-CM | POA: Insufficient documentation

## 2018-02-04 LAB — CBC
HEMATOCRIT: 45.3 % (ref 39.0–52.0)
HEMOGLOBIN: 14.6 g/dL (ref 13.0–17.0)
MCH: 29.6 pg (ref 26.0–34.0)
MCHC: 32.2 g/dL (ref 30.0–36.0)
MCV: 91.9 fL (ref 80.0–100.0)
Platelets: 167 10*3/uL (ref 150–400)
RBC: 4.93 MIL/uL (ref 4.22–5.81)
RDW: 12.6 % (ref 11.5–15.5)
WBC: 8.6 10*3/uL (ref 4.0–10.5)
nRBC: 0 % (ref 0.0–0.2)

## 2018-02-04 LAB — BASIC METABOLIC PANEL
Anion gap: 7 (ref 5–15)
BUN: 17 mg/dL (ref 6–20)
CO2: 27 mmol/L (ref 22–32)
Calcium: 9.2 mg/dL (ref 8.9–10.3)
Chloride: 105 mmol/L (ref 98–111)
Creatinine, Ser: 1.11 mg/dL (ref 0.61–1.24)
GFR calc Af Amer: >60 mL/min (ref >60)
GFR calc non Af Amer: >60 mL/min (ref >60)
GLUCOSE: 120 mg/dL — AB (ref 70–99)
POTASSIUM: 3.6 mmol/L (ref 3.5–5.1)
Sodium: 139 mmol/L (ref 135–145)

## 2018-02-04 LAB — HEPATIC FUNCTION PANEL
ALK PHOS: 61 U/L (ref 38–126)
ALT: 31 U/L (ref 0–44)
AST: 21 U/L (ref 15–41)
Albumin: 4.3 g/dL (ref 3.5–5.0)
Bilirubin, Direct: 0.4 mg/dL — ABNORMAL HIGH (ref 0.0–0.2)
Indirect Bilirubin: 1.6 mg/dL — ABNORMAL HIGH (ref 0.3–0.9)
Total Bilirubin: 2 mg/dL — ABNORMAL HIGH (ref 0.3–1.2)
Total Protein: 7.8 g/dL (ref 6.5–8.1)

## 2018-02-04 LAB — D-DIMER, QUANTITATIVE: D-Dimer, Quant: 1.08 ug/mL-FEU — ABNORMAL HIGH (ref 0.00–0.50)

## 2018-02-04 LAB — DIFFERENTIAL
Basophils Absolute: 0 10*3/uL (ref 0.0–0.1)
Basophils Relative: 0 %
Eosinophils Absolute: 0.1 10*3/uL (ref 0.0–0.5)
Eosinophils Relative: 1 %
LYMPHS PCT: 13 %
Lymphs Abs: 1.2 10*3/uL (ref 0.7–4.0)
Monocytes Absolute: 0.8 10*3/uL (ref 0.1–1.0)
Monocytes Relative: 9 %
Neutro Abs: 6.6 10*3/uL (ref 1.7–7.7)
Neutrophils Relative %: 76 %

## 2018-02-04 LAB — TROPONIN I
Troponin I: <0.03 ng/mL (ref <0.03)
Troponin I: <0.03 ng/mL (ref <0.03)

## 2018-02-04 LAB — CBG MONITORING, ED: Glucose-Capillary: 121 mg/dL — ABNORMAL HIGH (ref 70–99)

## 2018-02-04 IMAGING — DX DG CHEST 2V
2 series · 2 of 2 positions shown · non-contrast
Comparison: [DATE]

CLINICAL DATA: Chest pain AND diaphoretic x 2 days

EXAM:
CHEST - 2 VIEW

[chest pa]
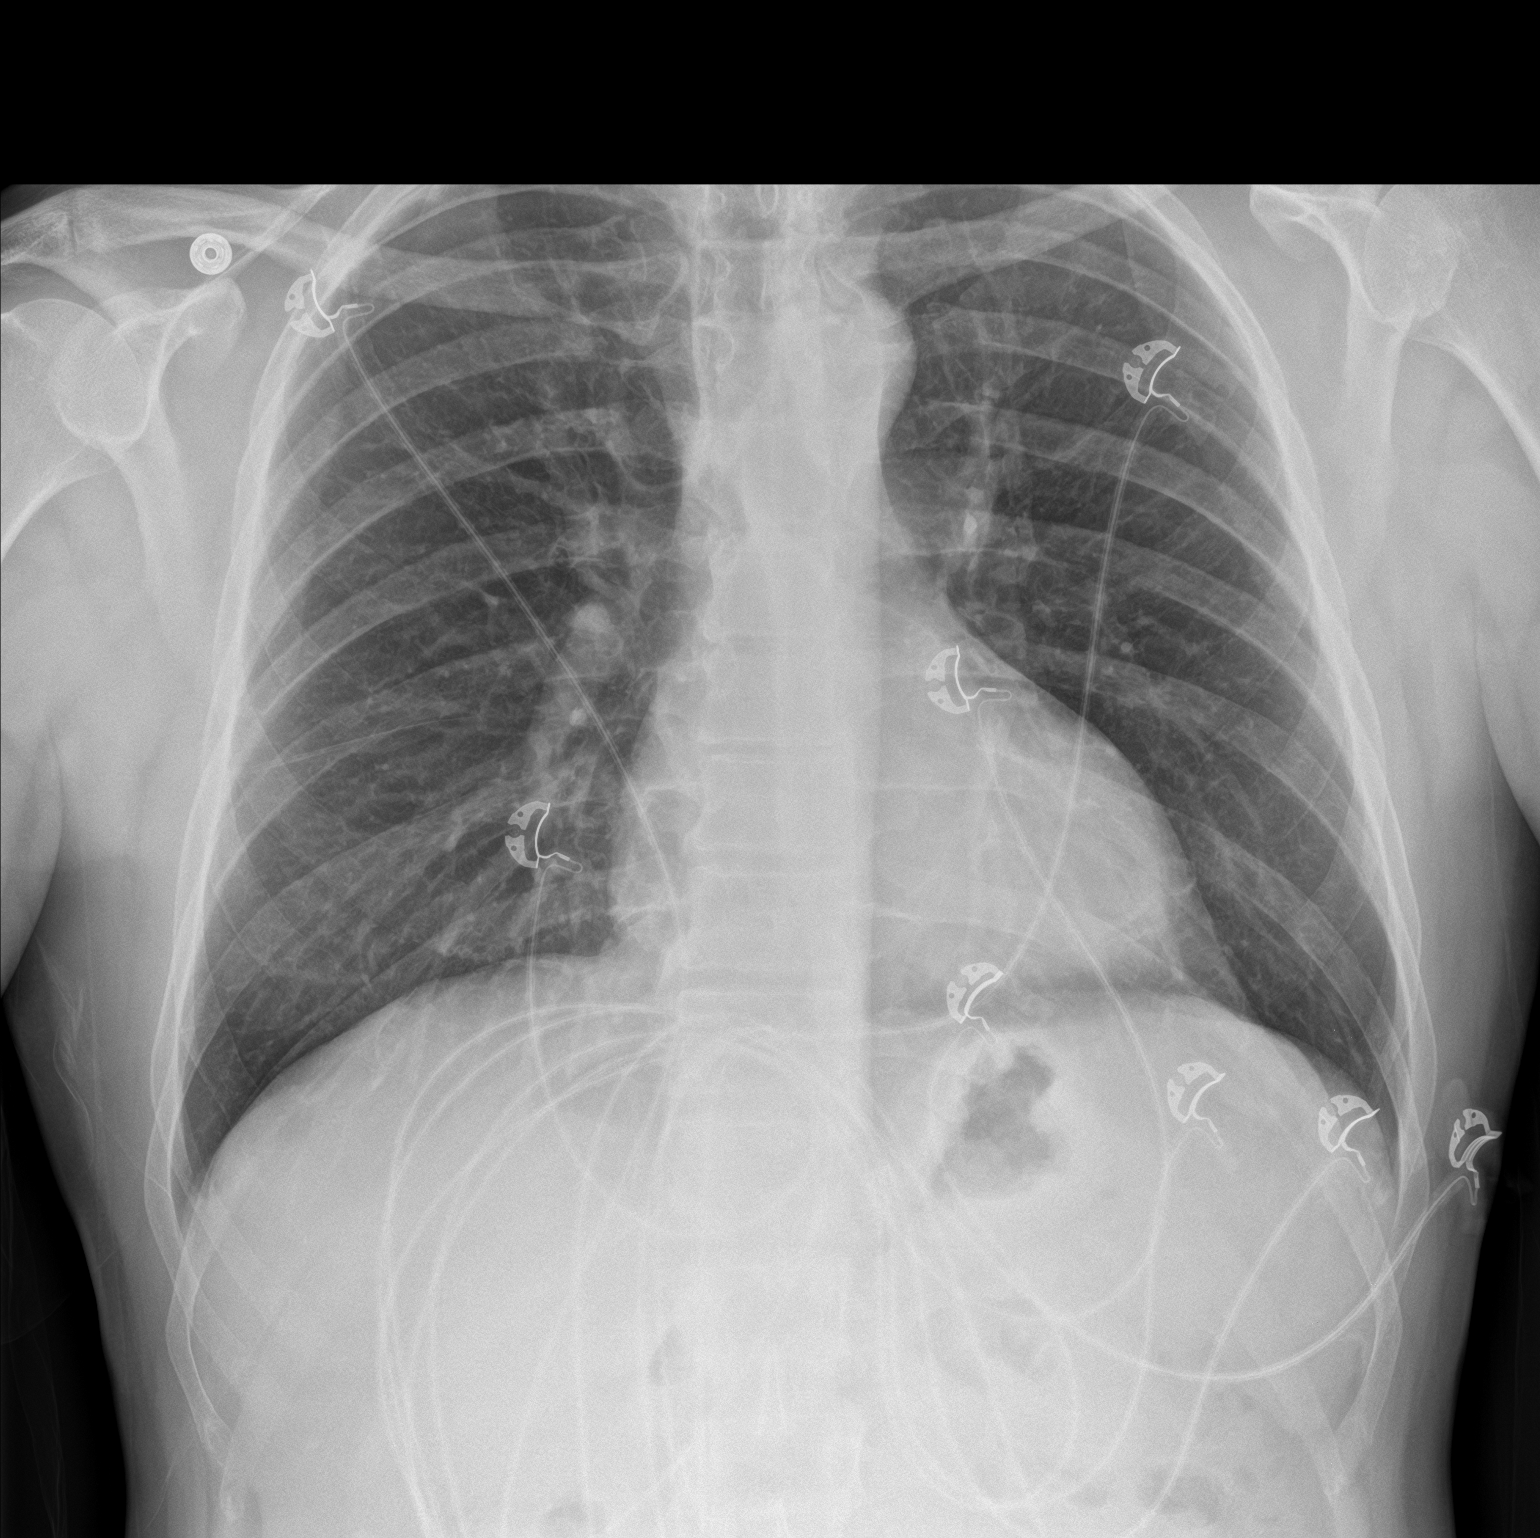

[chest lat]
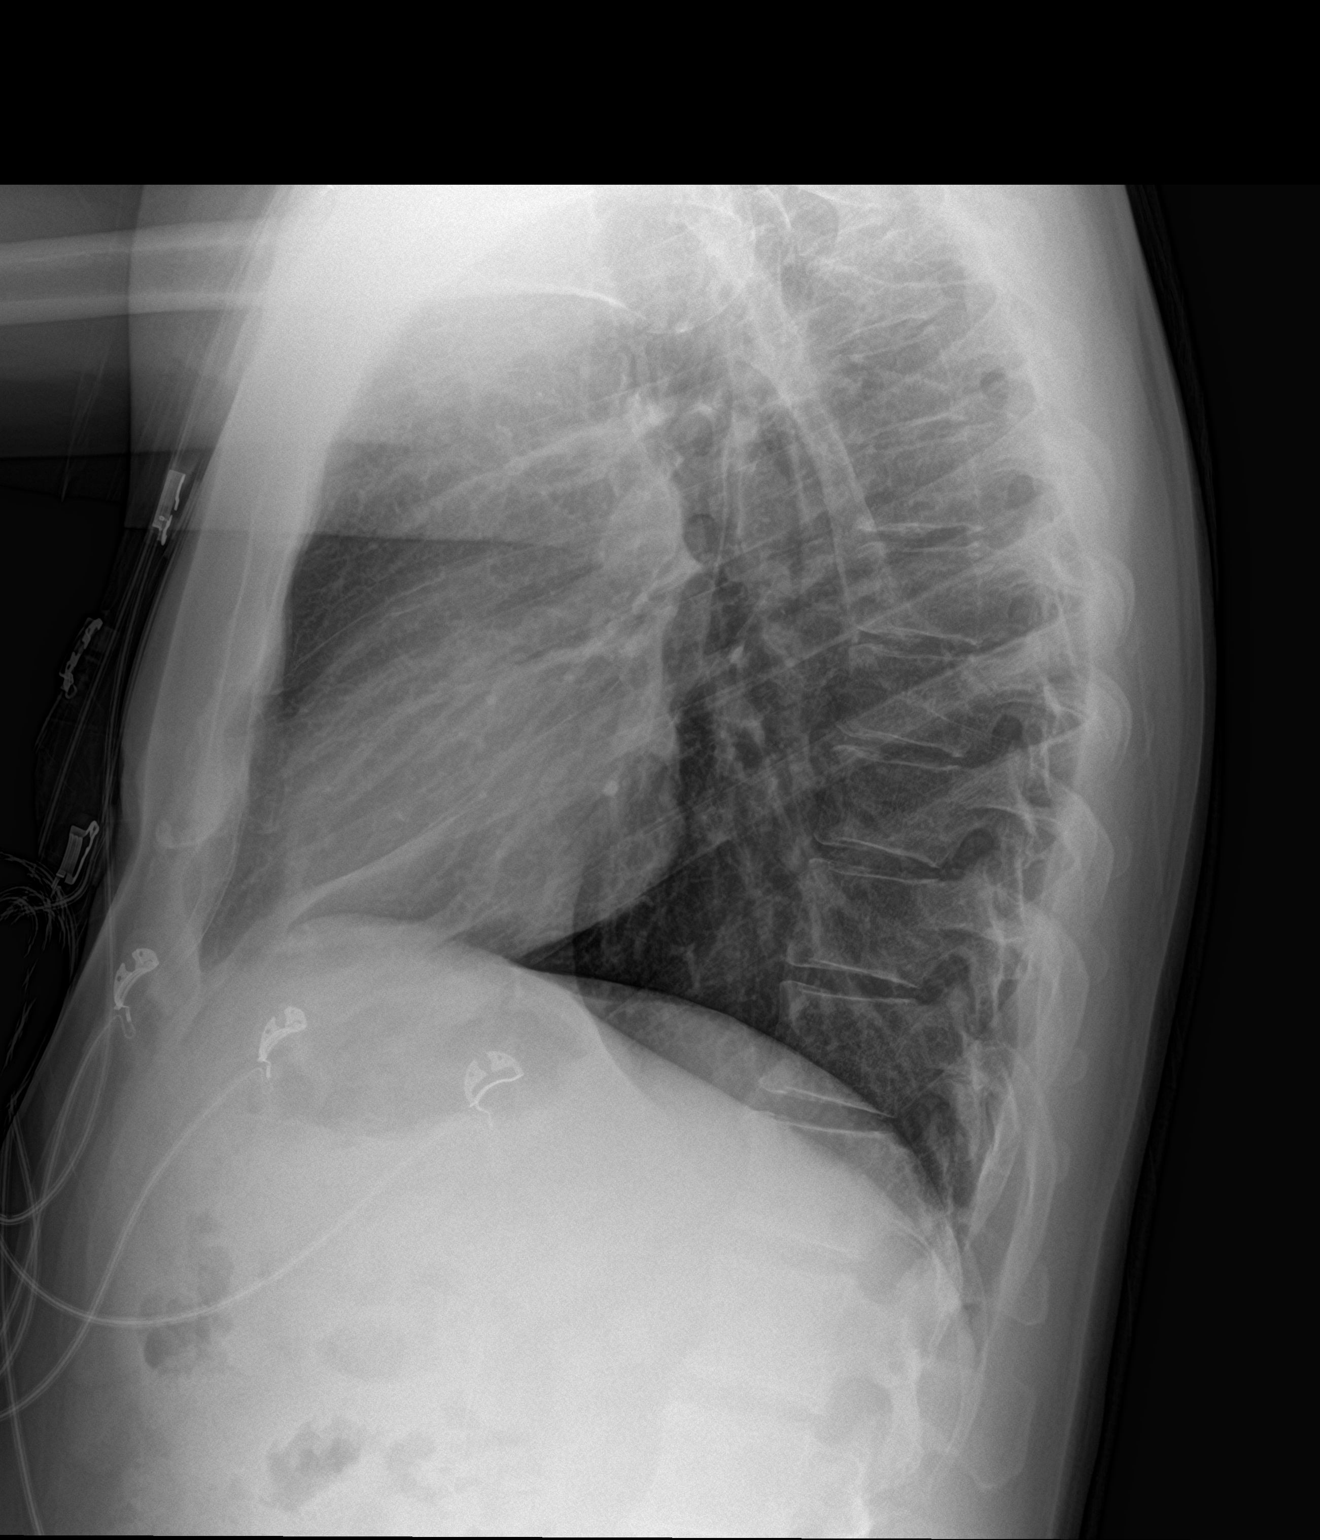

[2 of 2 positions shown; findings below may reference images not displayed]

FINDINGS: Lungs are clear.

Heart size and mediastinal contours are within normal limits.

No effusion.  No pneumothorax.

Visualized bones unremarkable.
IMPRESSION: No acute cardiopulmonary disease.

## 2018-02-04 IMAGING — CT CT ANGIO CHEST
2 of 6 series · 19 of 46 positions shown · IV contrast (Isovue)
Comparison: Radiographs [DATE].

CLINICAL DATA: Chest pain.

EXAM:
CT ANGIOGRAPHY CHEST WITH CONTRAST
TECHNIQUE: Multidetector CT imaging of the chest was performed using the
standard protocol during bolus administration of intravenous
contrast. Multiplanar CT image reconstructions and MIPs were
obtained to evaluate the vascular anatomy.
CONTRAST:  100mL [UY] IOPAMIDOL ([UY]) INJECTION 76%

[Series 5: thins · axial · 0.66mm/px · z∈[+1277,+1551]mm · 16 of 302 slices shown]
[im 14/302  lung]
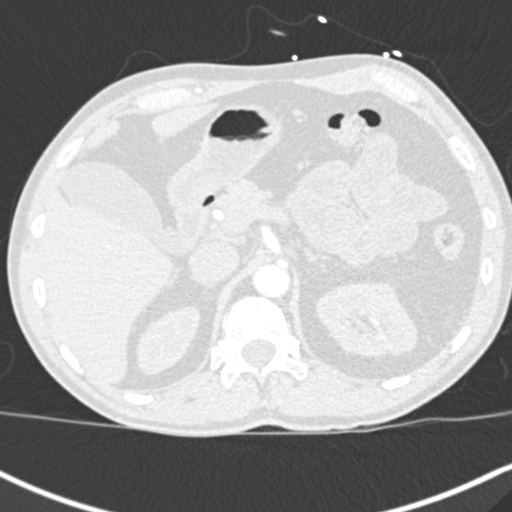
[im 40/302  soft-tissue]
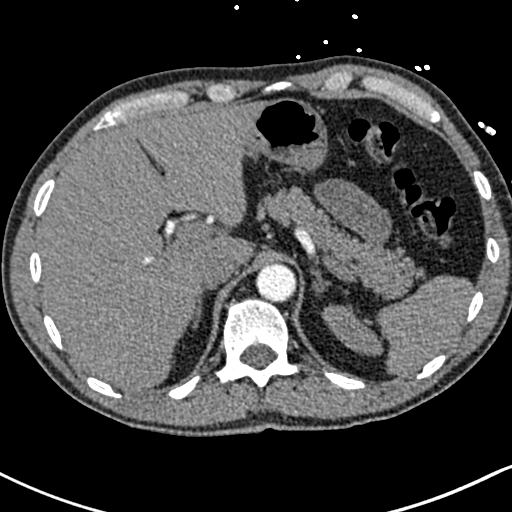
[im 53/302  lung]
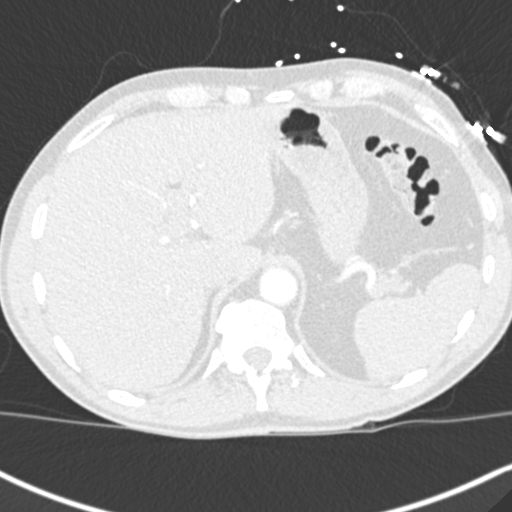
[im 66/302  soft-tissue]
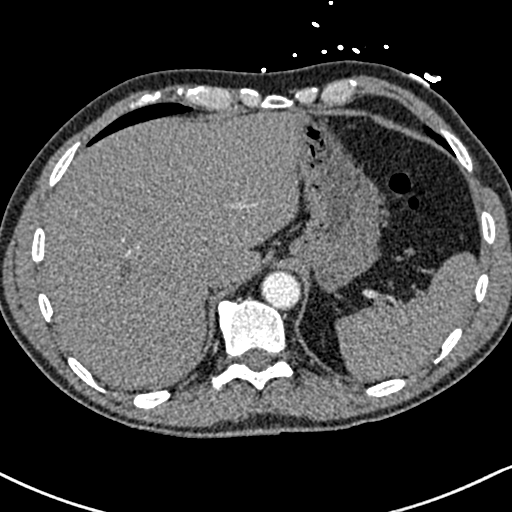
[im 92/302  lung]
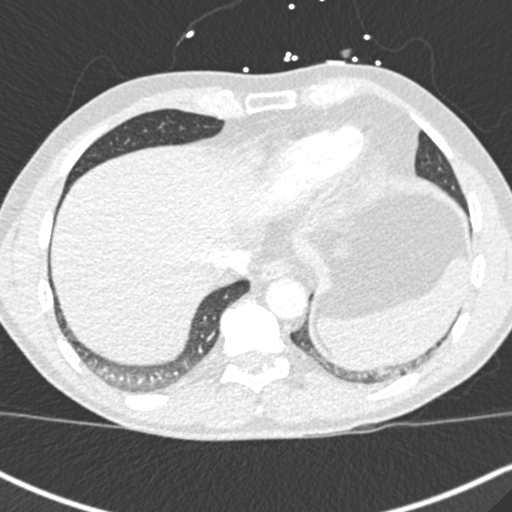
[im 105/302  soft-tissue]
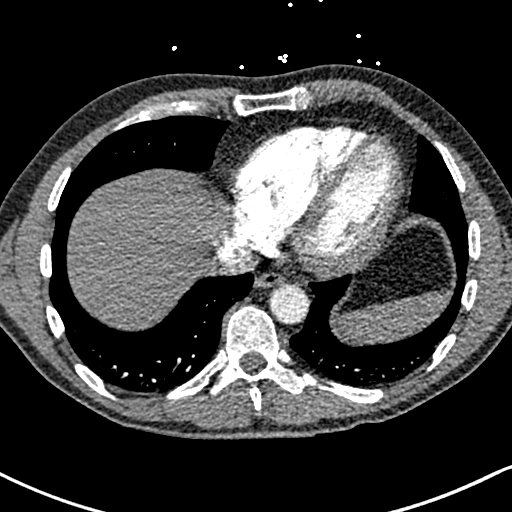
[im 118/302  lung]
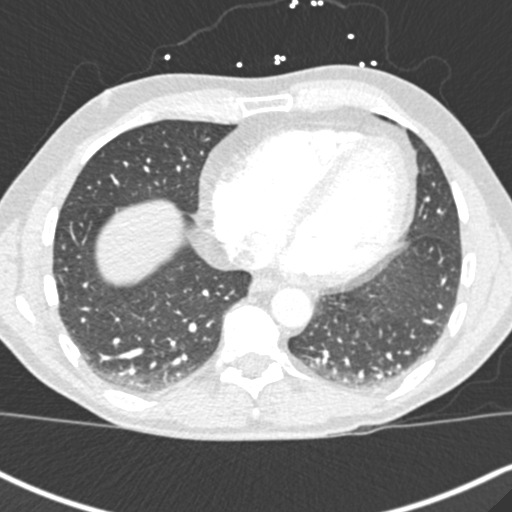
[im 144/302  soft-tissue]
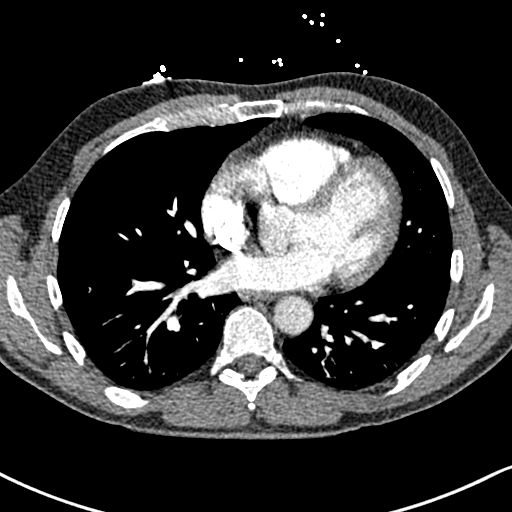
[im 158/302  lung]
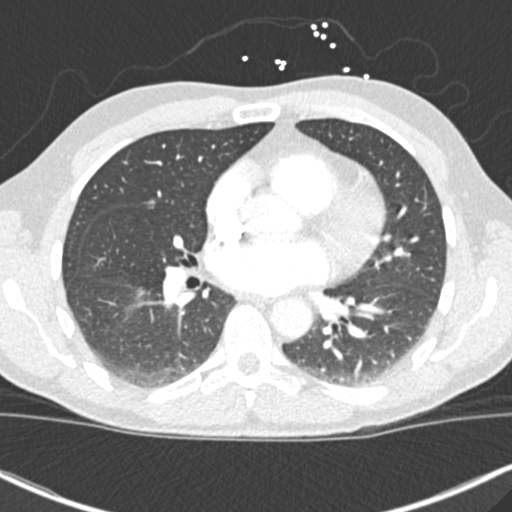
[im 184/302  soft-tissue]
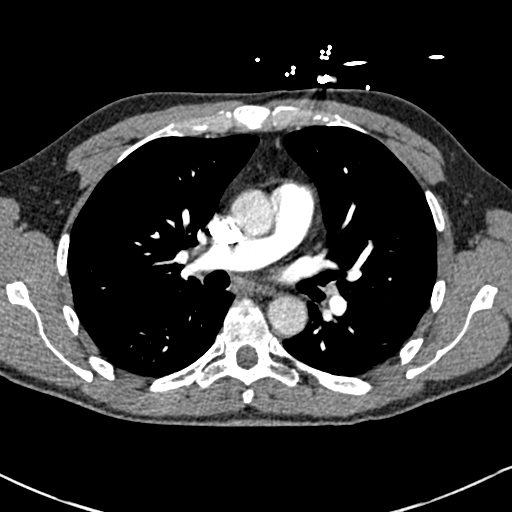
[im 197/302  lung]
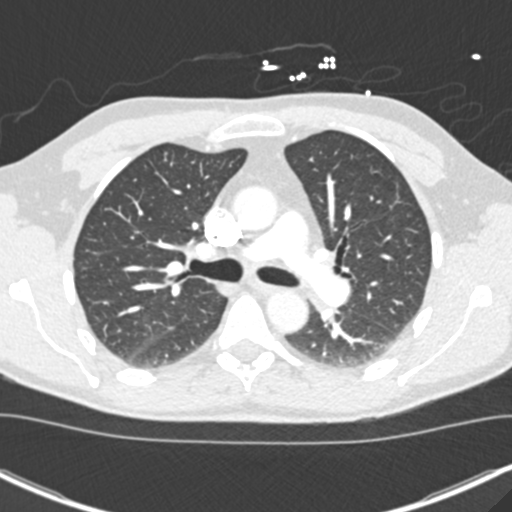
[im 210/302  soft-tissue]
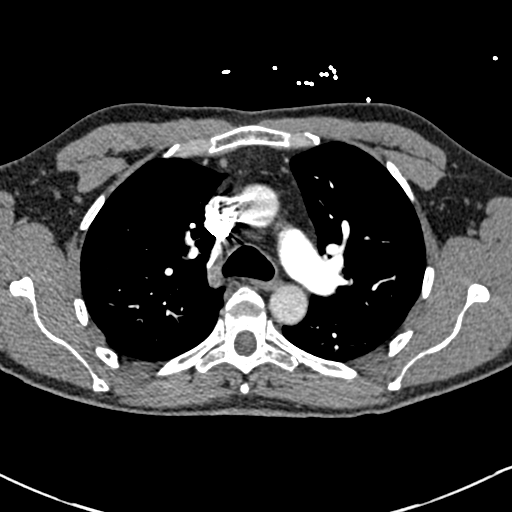
[im 236/302  lung]
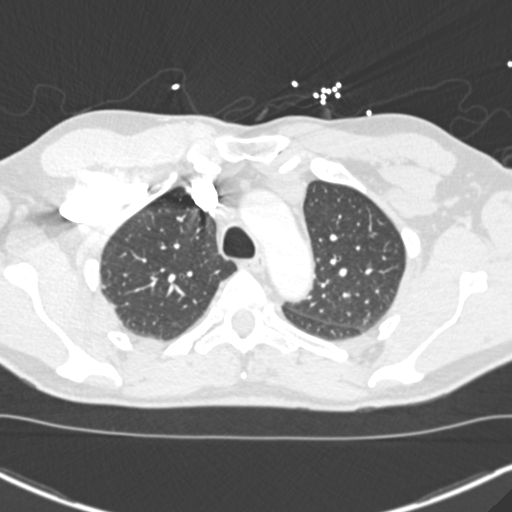
[im 249/302  soft-tissue]
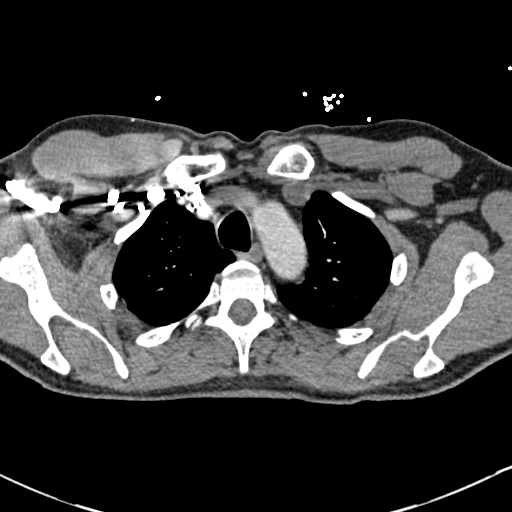
[im 262/302  lung]
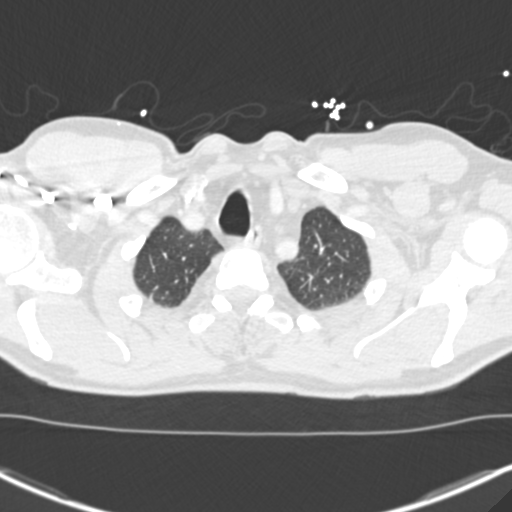
[im 288/302  soft-tissue]
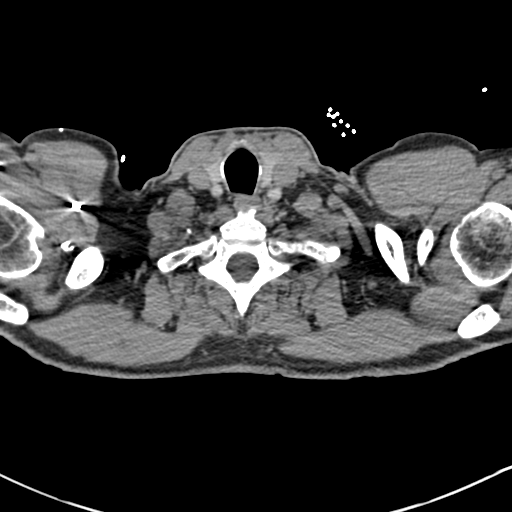

[Series 7: coronal mpr · coronal · 0.64mm/px · 3 of 151 slices shown]
[im 38/151  soft-tissue]
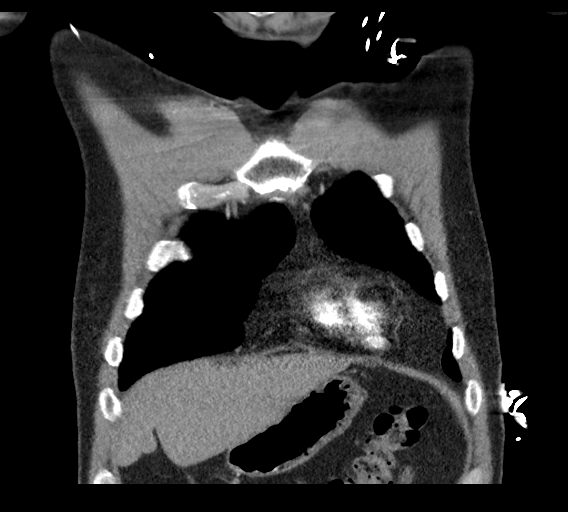
[im 76/151  soft-tissue]
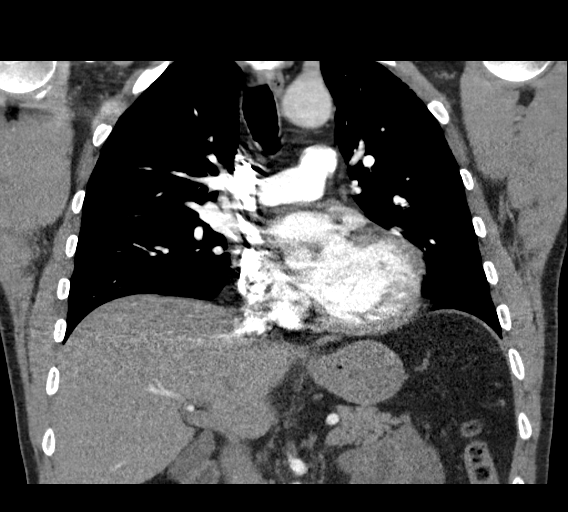
[im 113/151  soft-tissue]
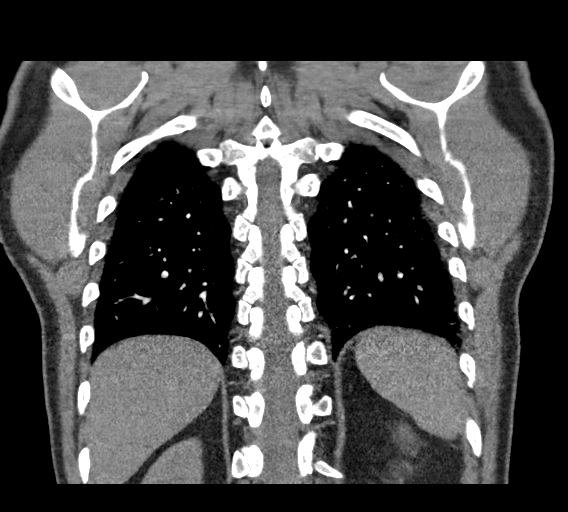

[19 of 46 positions shown; findings below may reference images not displayed]

FINDINGS: Cardiovascular: Satisfactory opacification of the pulmonary arteries
to the segmental level. No evidence of pulmonary embolism. Normal
heart size. No pericardial effusion.

Mediastinum/Nodes: No enlarged mediastinal, hilar, or axillary lymph
nodes. Thyroid gland, trachea, and esophagus demonstrate no
significant findings.

Lungs/Pleura: Lungs are clear. No pleural effusion or pneumothorax.

Upper Abdomen: No acute abnormality.

Musculoskeletal: No chest wall abnormality. No acute or significant
osseous findings.

Review of the MIP images confirms the above findings.
IMPRESSION: No evidence of pulmonary embolus. No acute abnormality seen in the
chest.

## 2018-02-04 MED ORDER — MORPHINE SULFATE (PF) 2 MG/ML IV SOLN
2.0000 mg | Freq: Once | INTRAVENOUS | Status: DC
  Filled 2018-02-04: qty 1

## 2018-02-04 MED ORDER — DOXYCYCLINE HYCLATE 100 MG PO CAPS: 100.0000 mg | ORAL_CAPSULE | Freq: Two times a day (BID) | ORAL | 0 refills | Status: DC

## 2018-02-04 MED ORDER — IOPAMIDOL (ISOVUE-370) INJECTION 76%
100.0000 mL | Freq: Once | INTRAVENOUS | Status: AC | PRN
  Administered 2018-02-04: 100 mL via INTRAVENOUS

## 2018-02-04 MED ORDER — TRAMADOL HCL 50 MG PO TABS: 50.0000 mg | ORAL_TABLET | Freq: Four times a day (QID) | ORAL | 0 refills | Status: DC | PRN

## 2018-02-04 MED ORDER — ONDANSETRON HCL 4 MG/2ML IJ SOLN
4.0000 mg | Freq: Once | INTRAMUSCULAR | Status: DC
  Filled 2018-02-04: qty 2

## 2018-02-04 NOTE — ED Triage Notes (Signed)
Patient complains of left sided chest pain that started Thursday. States shortness and nausea. States headache and dizziness.

## 2018-02-04 NOTE — ED Provider Notes (Signed)
[facility] EMERGENCY DEPARTMENT Provider Note   CSN: [id number] Arrival date & time: 02/04/18  2119     History   Chief Complaint Chief Complaint  Patient presents with  . Chest Pain    HPI Logan Bush is a 58 y.o. male.  Patient complains of left-sided chest pain worse with inspiration.  He also states he has been having pain in his left testicle for 2 weeks  The history is provided by the patient. No language interpreter was used.  Chest Pain  Pain location:  L chest Pain quality: aching   Pain radiates to:  Does not radiate Pain severity:  Moderate Onset quality:  Sudden Duration:  1 day Timing:  Constant Progression:  Worsening Chronicity:  New Context: breathing   Relieved by:  Nothing Worsened by:  Nothing Associated symptoms: no abdominal pain, no back pain, no cough, no fatigue and no headache   Risk factors: no aortic disease     Past Medical History:  Diagnosis Date  . Allergy   . Vertigo     Patient Active Problem List   Diagnosis Date Noted  . Abdominal pain 10/13/2017  . Change in bowel habits 05/12/2017  . RUQ pain 05/12/2017  . Palpitations 03/02/2017  . Osteoarthritis of left knee 03/02/2017  . Polyp of colon 09/16/2016    Past Surgical History:  Procedure Laterality Date  . COLONOSCOPY N/A 08/05/2017   Procedure: COLONOSCOPY;  Surgeon: Corbin Ade, MD;  Location: [facility];  Service: Endoscopy;  Laterality: N/A;  1:45pm-rescheduled to 7/12 @9 :30am per Darlina Rumpf  . POLYPECTOMY  10/2012  . POLYPECTOMY  08/05/2017   Procedure: POLYPECTOMY;  Surgeon: Corbin Ade, MD;  Location: [facility];  Service: Endoscopy;;        Home Medications    Prior to Admission medications   Medication Sig Start Date End Date Taking? Authorizing Provider  doxycycline (VIBRAMYCIN) 100 MG capsule Take 1 capsule (100 mg total) by mouth 2 (two) times daily. One po bid x 7 days 02/04/18   Bethann Berkshire, MD  traMADol (ULTRAM) 50 MG tablet Take 1  tablet (50 mg total) by mouth every 6 (six) hours as needed. 02/04/18   Bethann Berkshire, MD    Family History Family History  Problem Relation Age of Onset  . Heart attack Mother   . Hypertension Mother   . Cancer Father        prostate cancer  . Asthma Father   . Cancer Sister        breast cancer  . Cancer Maternal Grandmother        Breast Cancer  . Colon cancer Neg Hx   . Colon polyps Neg Hx     Social History Social History   Tobacco Use  . Smoking status: Passive Smoke Exposure - Never Smoker  . Smokeless tobacco: Never Used  Substance Use Topics  . Alcohol use: No  . Drug use: No     Allergies   Aspirin   Review of Systems Review of Systems  Constitutional: Negative for appetite change and fatigue.  HENT: Negative for congestion, ear discharge and sinus pressure.   Eyes: Negative for discharge.  Respiratory: Negative for cough.   Cardiovascular: Positive for chest pain.  Gastrointestinal: Negative for abdominal pain and diarrhea.  Genitourinary: Negative for frequency and hematuria.  Musculoskeletal: Negative for back pain.  Skin: Negative for rash.  Neurological: Negative for seizures and headaches.  Psychiatric/Behavioral: Negative for hallucinations.     Physical  Exam Updated Vital Signs BP 126/86   Pulse 64   Resp 14   Ht 5\' 6"  (1.676 m)   Wt 81.6 kg   SpO2 100%   BMI 29.05 kg/m   Physical Exam Vitals signs reviewed.  Constitutional:      Appearance: He is well-developed.  HENT:     Head: Normocephalic.     Nose: Nose normal.  Eyes:     General: No scleral icterus.    Conjunctiva/sclera: Conjunctivae normal.  Neck:     Musculoskeletal: Neck supple.     Thyroid: No thyromegaly.  Cardiovascular:     Rate and Rhythm: Normal rate and regular rhythm.     Heart sounds: No murmur. No friction rub. No gallop.   Pulmonary:     Breath sounds: No stridor. No wheezing or rales.     Comments: Tender left chest Chest:     Chest wall:  Tenderness present.  Abdominal:     General: There is no distension.     Tenderness: There is no abdominal tenderness. There is no rebound.  Genitourinary:    Comments: Tender mild left testicle.  No deformity noted Musculoskeletal: Normal range of motion.  Lymphadenopathy:     Cervical: No cervical adenopathy.  Skin:    Findings: No erythema or rash.  Neurological:     Mental Status: He is alert and oriented to person, place, and time.     Motor: No abnormal muscle tone.     Coordination: Coordination normal.  Psychiatric:        Behavior: Behavior normal.      ED Treatments / Results  Labs (all labs ordered are listed, but only abnormal results are displayed) Labs Reviewed  BASIC METABOLIC PANEL - Abnormal; Notable for the following components:      Result Value   Glucose, Bld 120 (*)    All other components within normal limits  HEPATIC FUNCTION PANEL - Abnormal; Notable for the following components:   Total Bilirubin 2.0 (*)    Bilirubin, Direct 0.4 (*)    Indirect Bilirubin 1.6 (*)    All other components within normal limits  D-DIMER, QUANTITATIVE (NOT AT [facility]) - Abnormal; Notable for the following components:   D-Dimer, Quant 1.08 (*)    All other components within normal limits  CBG MONITORING, ED - Abnormal; Notable for the following components:   Glucose-Capillary 121 (*)    All other components within normal limits  CBC  TROPONIN I  DIFFERENTIAL  TROPONIN I    EKG EKG Interpretation  Date/Time:  Saturday February 04 2018 10:01:08 EST Ventricular Rate:  77 PR Interval:    QRS Duration: 95 QT Interval:  395 QTC Calculation: 447 R Axis:   -40 Text Interpretation:  Sinus rhythm Inferior infarct, old Confirmed by Bethann BerkshireZammit, Teshia Mahone [phone]([id number]) on 02/04/2018 3:09:47 PM   Radiology Dg Chest 2 View  Result Date: 02/04/2018 CLINICAL DATA:  Chest pain AND diaphoretic x 2 days EXAM: CHEST - 2 VIEW COMPARISON:  11/08/2016 FINDINGS: Lungs are clear. Heart size and  mediastinal contours are within normal limits. No effusion.  No pneumothorax. Visualized bones unremarkable. IMPRESSION: No acute cardiopulmonary disease. Electronically Signed   By: Corlis Leak  Hassell M.D.   On: 02/04/2018 11:02   Ct Angio Chest Pe W And/or Wo Contrast  Result Date: 02/04/2018 CLINICAL DATA:  Chest pain. EXAM: CT ANGIOGRAPHY CHEST WITH CONTRAST TECHNIQUE: Multidetector CT imaging of the chest was performed using the standard protocol during bolus administration of intravenous contrast.  Multiplanar CT image reconstructions and MIPs were obtained to evaluate the vascular anatomy. CONTRAST:  ISOVUE-370 IOPAMIDOL (ISOVUE-370) INJECTION 76% COMPARISON:  Radiographs of February 04, 2018. FINDINGS: Cardiovascular: Satisfactory opacification of the pulmonary arteries to the segmental level. No evidence of pulmonary embolism. Normal heart size. No pericardial effusion. Mediastinum/Nodes: No enlarged mediastinal, hilar, or axillary lymph nodes. Thyroid gland, trachea, and esophagus demonstrate no significant findings. Lungs/Pleura: Lungs are clear. No pleural effusion or pneumothorax. Upper Abdomen: No acute abnormality. Musculoskeletal: No chest wall abnormality. No acute or significant osseous findings. Review of the MIP images confirms the above findings. IMPRESSION: No evidence of pulmonary embolus. No acute abnormality seen in the chest. Electronically Signed   By: Lupita Raider, M.D.   On: 02/04/2018 14:49    Procedures Procedures (including critical care time)  Medications Ordered in ED Medications  morphine 2 MG/ML injection 2 mg (2 mg Intravenous Refused 02/04/18 1039)  ondansetron (ZOFRAN) injection 4 mg (4 mg Intravenous Refused 02/04/18 1038)  iopamidol (ISOVUE-370) 76 % injection 100 mL (100 mLs Intravenous Contrast Given 02/04/18 1344)     Initial Impression / Assessment and Plan / ED Course  I have reviewed the triage vital signs and the nursing notes.  Pertinent labs &  imaging results that were available during my care of the patient were reviewed by me and considered in my medical decision making (see chart for details).     Troponin negative x2 CT angios negative.  Patient with atypical chest pain he will be given Ultram for pain and follow-up with his PCP.  Also patient with tenderness in his left testicle patient will be treated empirically for epididymitis with doxycycline.  He will follow-up with his primary care doctor for this  Final Clinical Impressions(s) / ED Diagnoses   Final diagnoses:  Atypical chest pain    ED Discharge Orders         Ordered    traMADol (ULTRAM) 50 MG tablet  Every 6 hours PRN     02/04/18 1516    doxycycline (VIBRAMYCIN) 100 MG capsule  2 times daily     02/04/18 1516           Bethann Berkshire, MD 02/04/18 1521

## 2018-02-04 NOTE — ED Notes (Signed)
This nurse had already drawn up the morphine. Pt then refused and stated he did not want it. Wasted 2mg  of Morphine with Elwanda Brooklyn, RN

## 2018-02-04 NOTE — Discharge Instructions (Addendum)
Follow-up with your primary care doctor next week for recheck. 

## 2018-07-15 ENCOUNTER — Emergency Department (HOSPITAL_COMMUNITY): Payer: Self-pay

## 2018-07-15 ENCOUNTER — Other Ambulatory Visit: Payer: Self-pay

## 2018-07-15 ENCOUNTER — Encounter (HOSPITAL_COMMUNITY): Payer: Self-pay | Admitting: Emergency Medicine

## 2018-07-15 ENCOUNTER — Emergency Department (HOSPITAL_COMMUNITY)
Admission: EM | Admit: 2018-07-15 | Discharge: 2018-07-16 | Disposition: A | Discharge status: Home / Self Care | Payer: Self-pay | Attending: Emergency Medicine | Admitting: Emergency Medicine

## 2018-07-15 DIAGNOSIS — R300 Dysuria: Secondary | ICD-10-CM | POA: Insufficient documentation

## 2018-07-15 DIAGNOSIS — Z7722 Contact with and (suspected) exposure to environmental tobacco smoke (acute) (chronic): Secondary | ICD-10-CM | POA: Insufficient documentation

## 2018-07-15 DIAGNOSIS — R072 Precordial pain: Secondary | ICD-10-CM | POA: Insufficient documentation

## 2018-07-15 DIAGNOSIS — R319 Hematuria, unspecified: Secondary | ICD-10-CM | POA: Insufficient documentation

## 2018-07-15 DIAGNOSIS — N410 Acute prostatitis: Secondary | ICD-10-CM | POA: Insufficient documentation

## 2018-07-15 LAB — BASIC METABOLIC PANEL
Anion gap: 7 (ref 5–15)
BUN: 18 mg/dL (ref 6–20)
CO2: 28 mmol/L (ref 22–32)
Calcium: 8.9 mg/dL (ref 8.9–10.3)
Chloride: 104 mmol/L (ref 98–111)
Creatinine, Ser: 1.13 mg/dL (ref 0.61–1.24)
GFR calc Af Amer: >60 mL/min (ref >60)
GFR calc non Af Amer: >60 mL/min (ref >60)
Glucose, Bld: 84 mg/dL (ref 70–99)
Potassium: 3.6 mmol/L (ref 3.5–5.1)
Sodium: 139 mmol/L (ref 135–145)

## 2018-07-15 LAB — CBC
HCT: 42.3 % (ref 39.0–52.0)
Hemoglobin: 13.9 g/dL (ref 13.0–17.0)
MCH: 30.3 pg (ref 26.0–34.0)
MCHC: 32.9 g/dL (ref 30.0–36.0)
MCV: 92.4 fL (ref 80.0–100.0)
Platelets: 204 10*3/uL (ref 150–400)
RBC: 4.58 MIL/uL (ref 4.22–5.81)
RDW: 12.6 % (ref 11.5–15.5)
WBC: 7.9 10*3/uL (ref 4.0–10.5)
nRBC: 0 % (ref 0.0–0.2)

## 2018-07-15 LAB — URINALYSIS, ROUTINE W REFLEX MICROSCOPIC
Bilirubin Urine: NEGATIVE
Glucose, UA: NEGATIVE mg/dL
Ketones, ur: NEGATIVE mg/dL
Nitrite: NEGATIVE
Protein, ur: NEGATIVE mg/dL
Specific Gravity, Urine: 1.009 (ref 1.005–1.030)
pH: 7 (ref 5.0–8.0)

## 2018-07-15 LAB — TROPONIN I: Troponin I: <0.03 ng/mL (ref <0.03)

## 2018-07-15 IMAGING — CT CT RENAL STONE PROTOCOL
2 of 4 series · 16 of 46 positions shown, 18 images · non-contrast
Comparison: CT [DATE]

CLINICAL DATA: Flank pain

EXAM:
CT ABDOMEN AND PELVIS WITHOUT CONTRAST
TECHNIQUE: Multidetector CT imaging of the abdomen and pelvis was performed
following the standard protocol without IV contrast.

[Series 2: axial st · axial · 0.72mm/px · z∈[+822,+1237]mm · 13 of 93 slices shown, 15 images]
[im 5/93  soft-tissue]
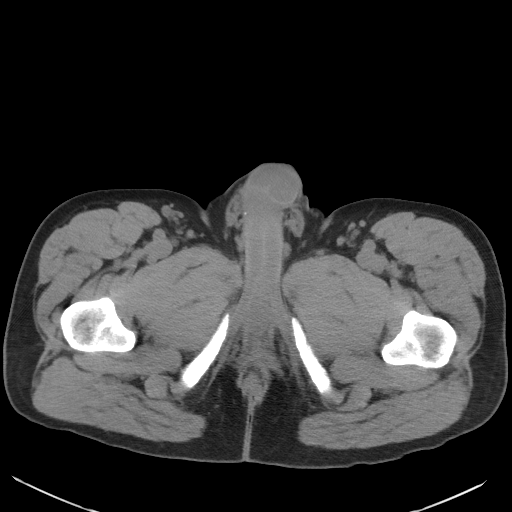
[im 5/93  bone]
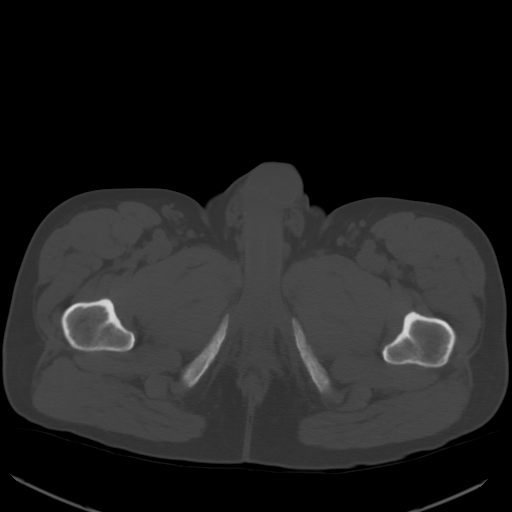
[im 15/93  soft-tissue]
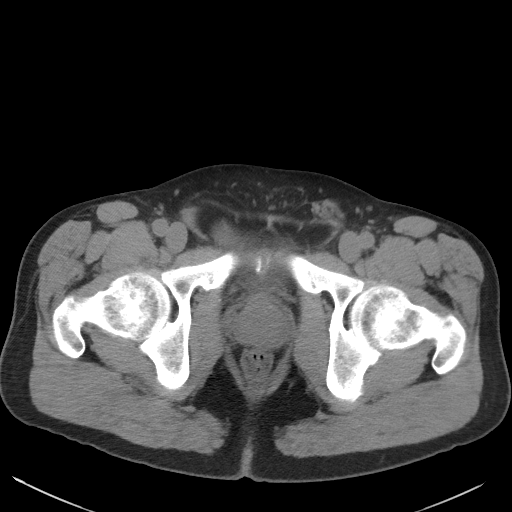
[im 20/93  soft-tissue]
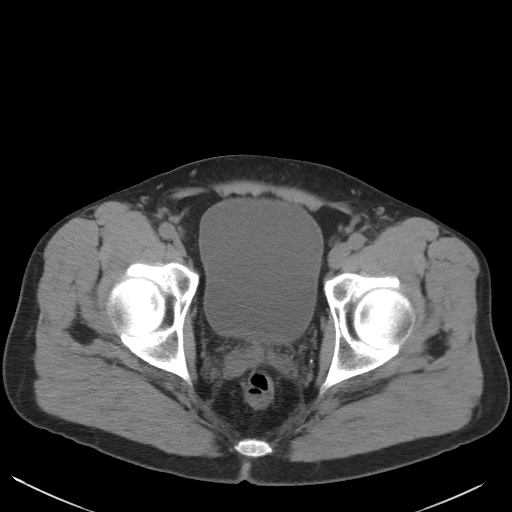
[im 25/93  soft-tissue]
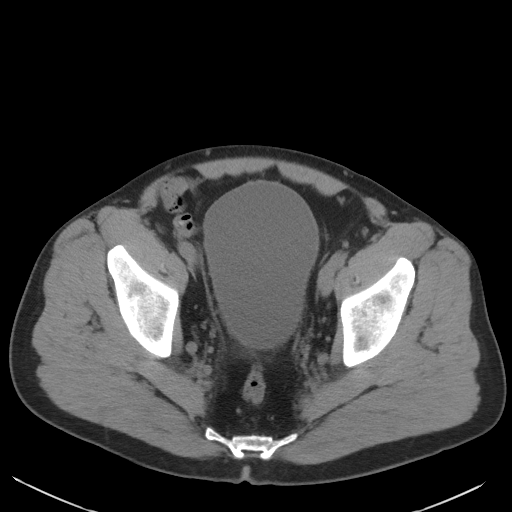
[im 34/93  soft-tissue]
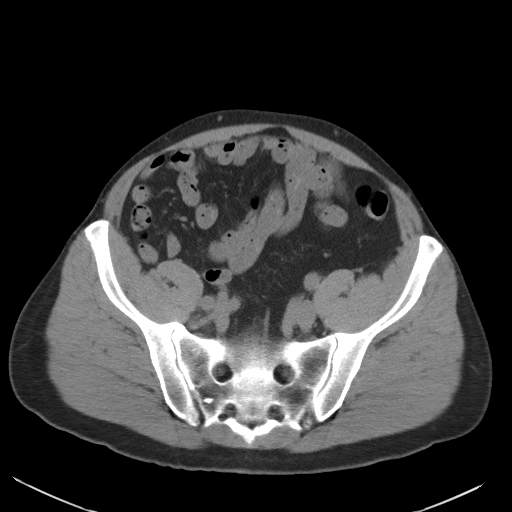
[im 39/93  soft-tissue]
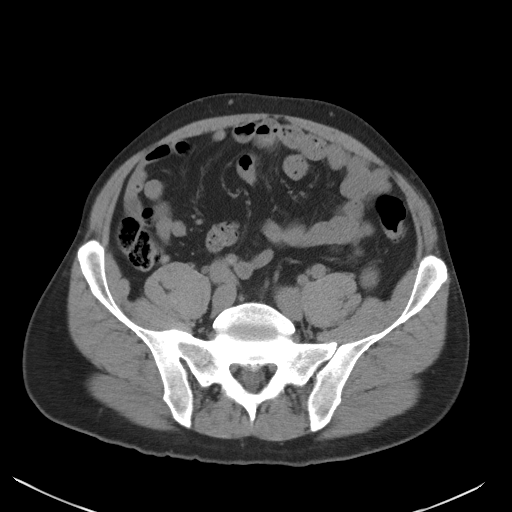
[im 49/93  soft-tissue]
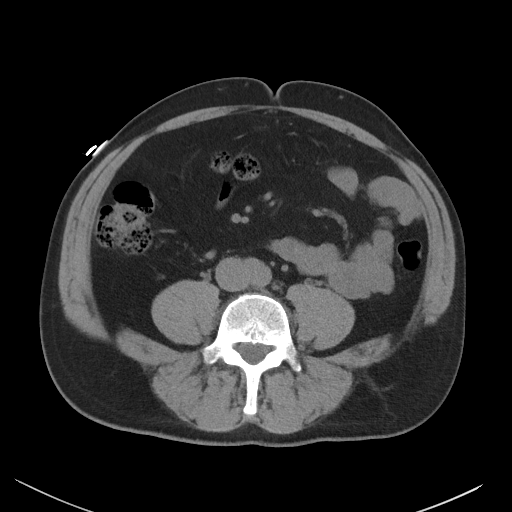
[im 54/93  soft-tissue]
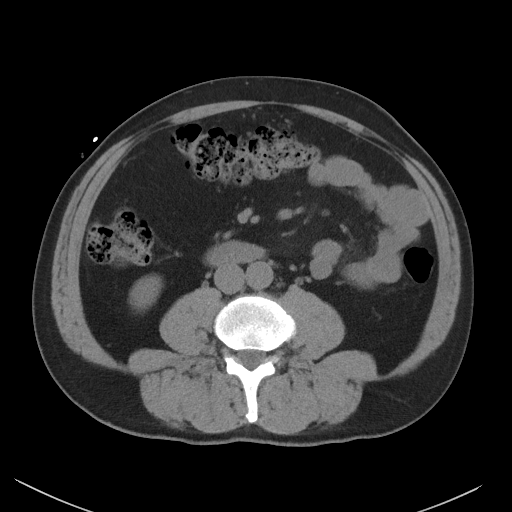
[im 59/93  soft-tissue]
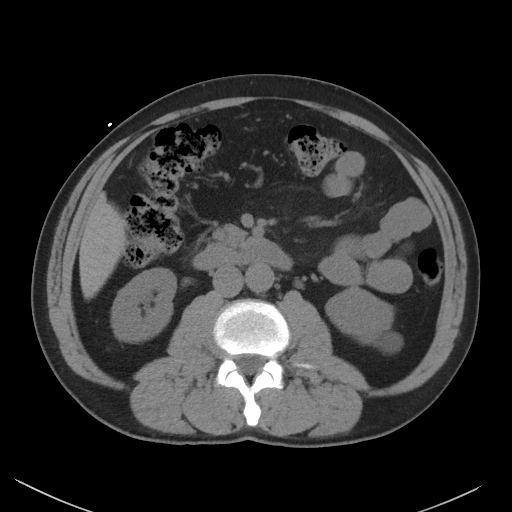
[im 59/93  bone]
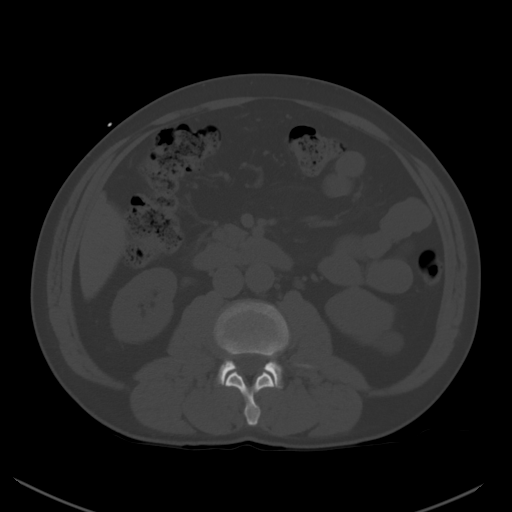
[im 68/93  soft-tissue]
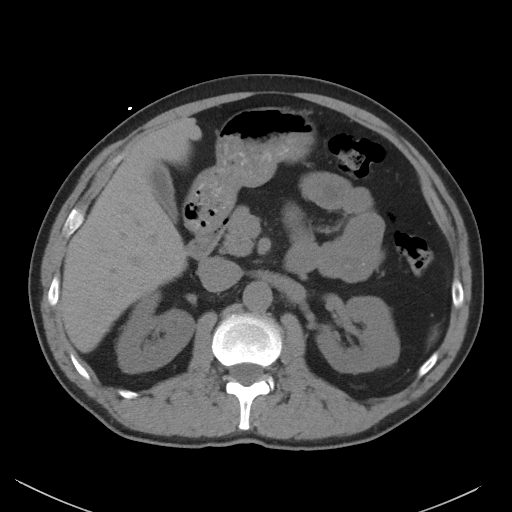
[im 73/93  soft-tissue]
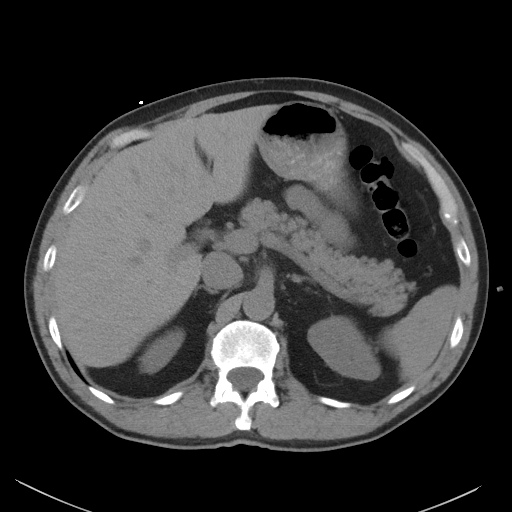
[im 78/93  soft-tissue]
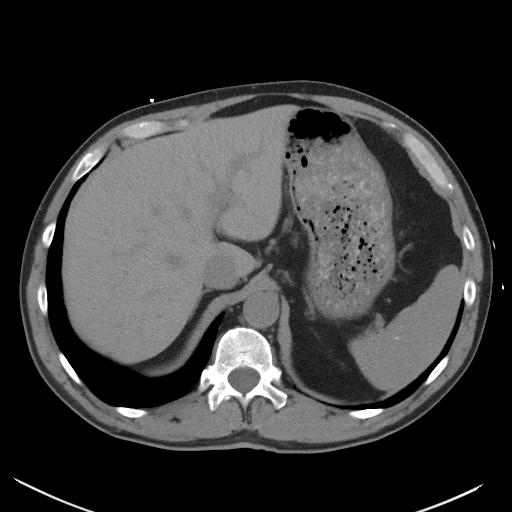
[im 88/93  soft-tissue]
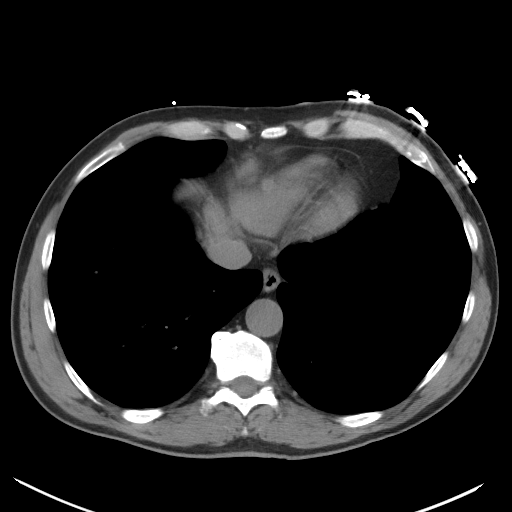

[Series 5: coronal st · coronal · 0.78mm/px · 3 of 94 slices shown]
[im 32/94  soft-tissue]
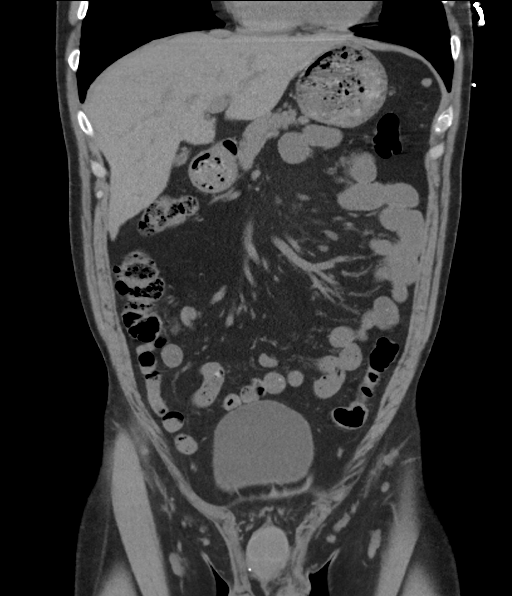
[im 42/94  soft-tissue]
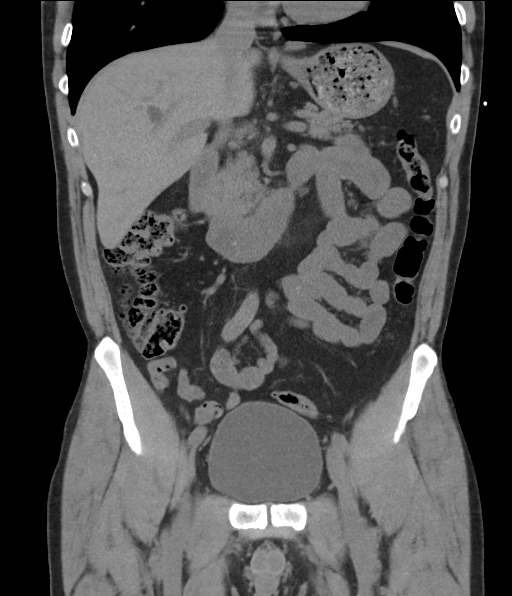
[im 52/94  soft-tissue]
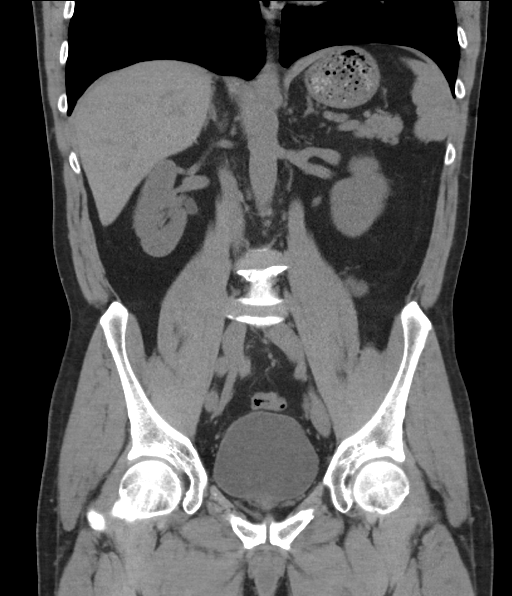

[16 of 46 positions shown; findings below may reference images not displayed]

FINDINGS: Lower chest: No acute abnormality.

Hepatobiliary: Cyst in the right hepatic lobe. No calcified
gallstones or biliary dilatation.

Pancreas: Unremarkable. No pancreatic ductal dilatation or
surrounding inflammatory changes.

Spleen: Normal in size without focal abnormality.

Adrenals/Urinary Tract: Adrenal glands are normal. No
hydronephrosis. Cyst mid to lower left kidney. Distended urinary
bladder.

Stomach/Bowel: Stomach is within normal limits. Appendix appears
normal. No evidence of bowel wall thickening, distention, or
inflammatory changes. Sigmoid colon diverticular disease without
acute inflammatory process.

Vascular/Lymphatic: No significant vascular findings are present. No
enlarged abdominal or pelvic lymph nodes.

Reproductive: Hazy attenuation around the prostate and seminal
vesicles.

Other: Negative for free air or free fluid.

Musculoskeletal: No acute or significant osseous findings.
IMPRESSION: 1. Hazy infiltration of the fat planes around the prostate and
seminal vesicles, question inflammatory process/prostatitis.
2. Negative for hydronephrosis or ureteral stone.
3. Mild sigmoid colon diverticular disease without acute
inflammatory process

## 2018-07-15 IMAGING — DX CHEST - 2 VIEW
2 series · 2 of 2 positions shown · non-contrast
Comparison: [DATE]

CLINICAL DATA: Chest pain

EXAM:
CHEST - 2 VIEW

[chest pa]
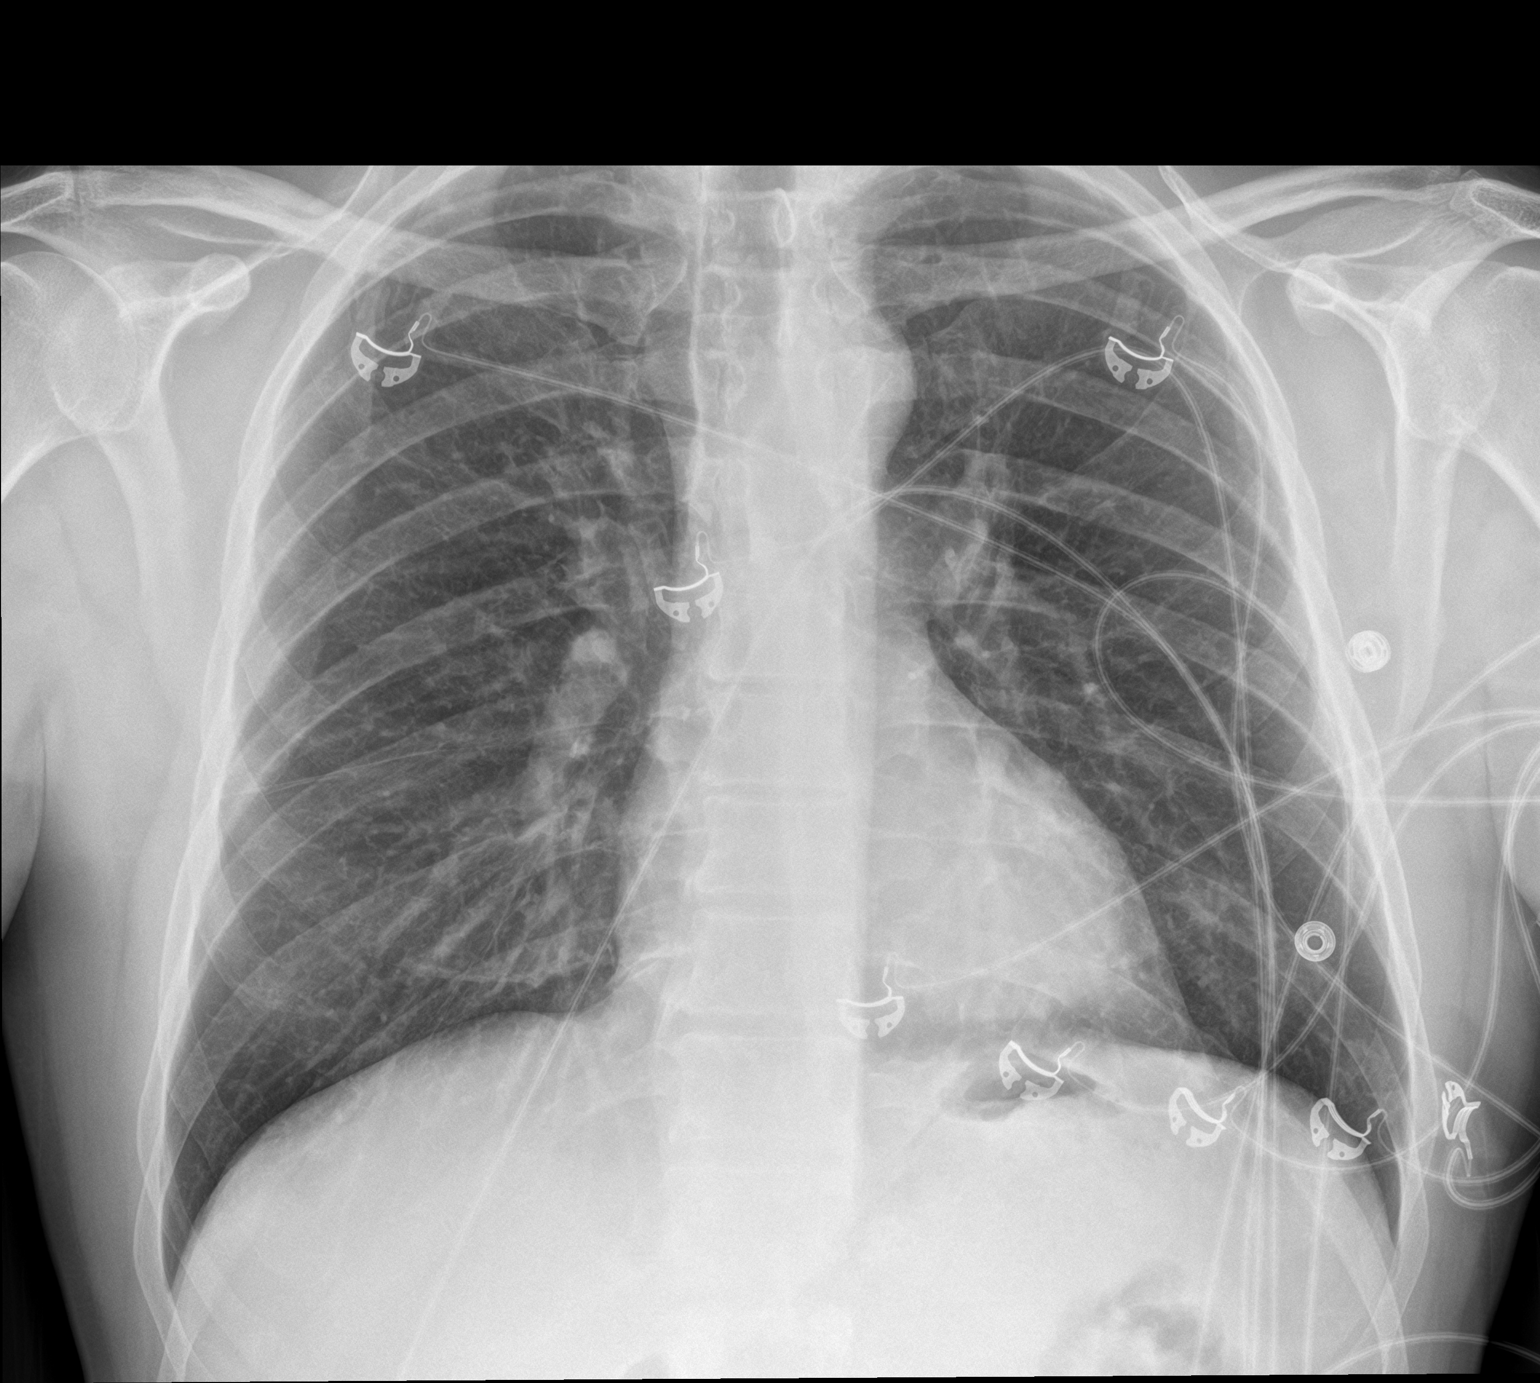

[chest lat]
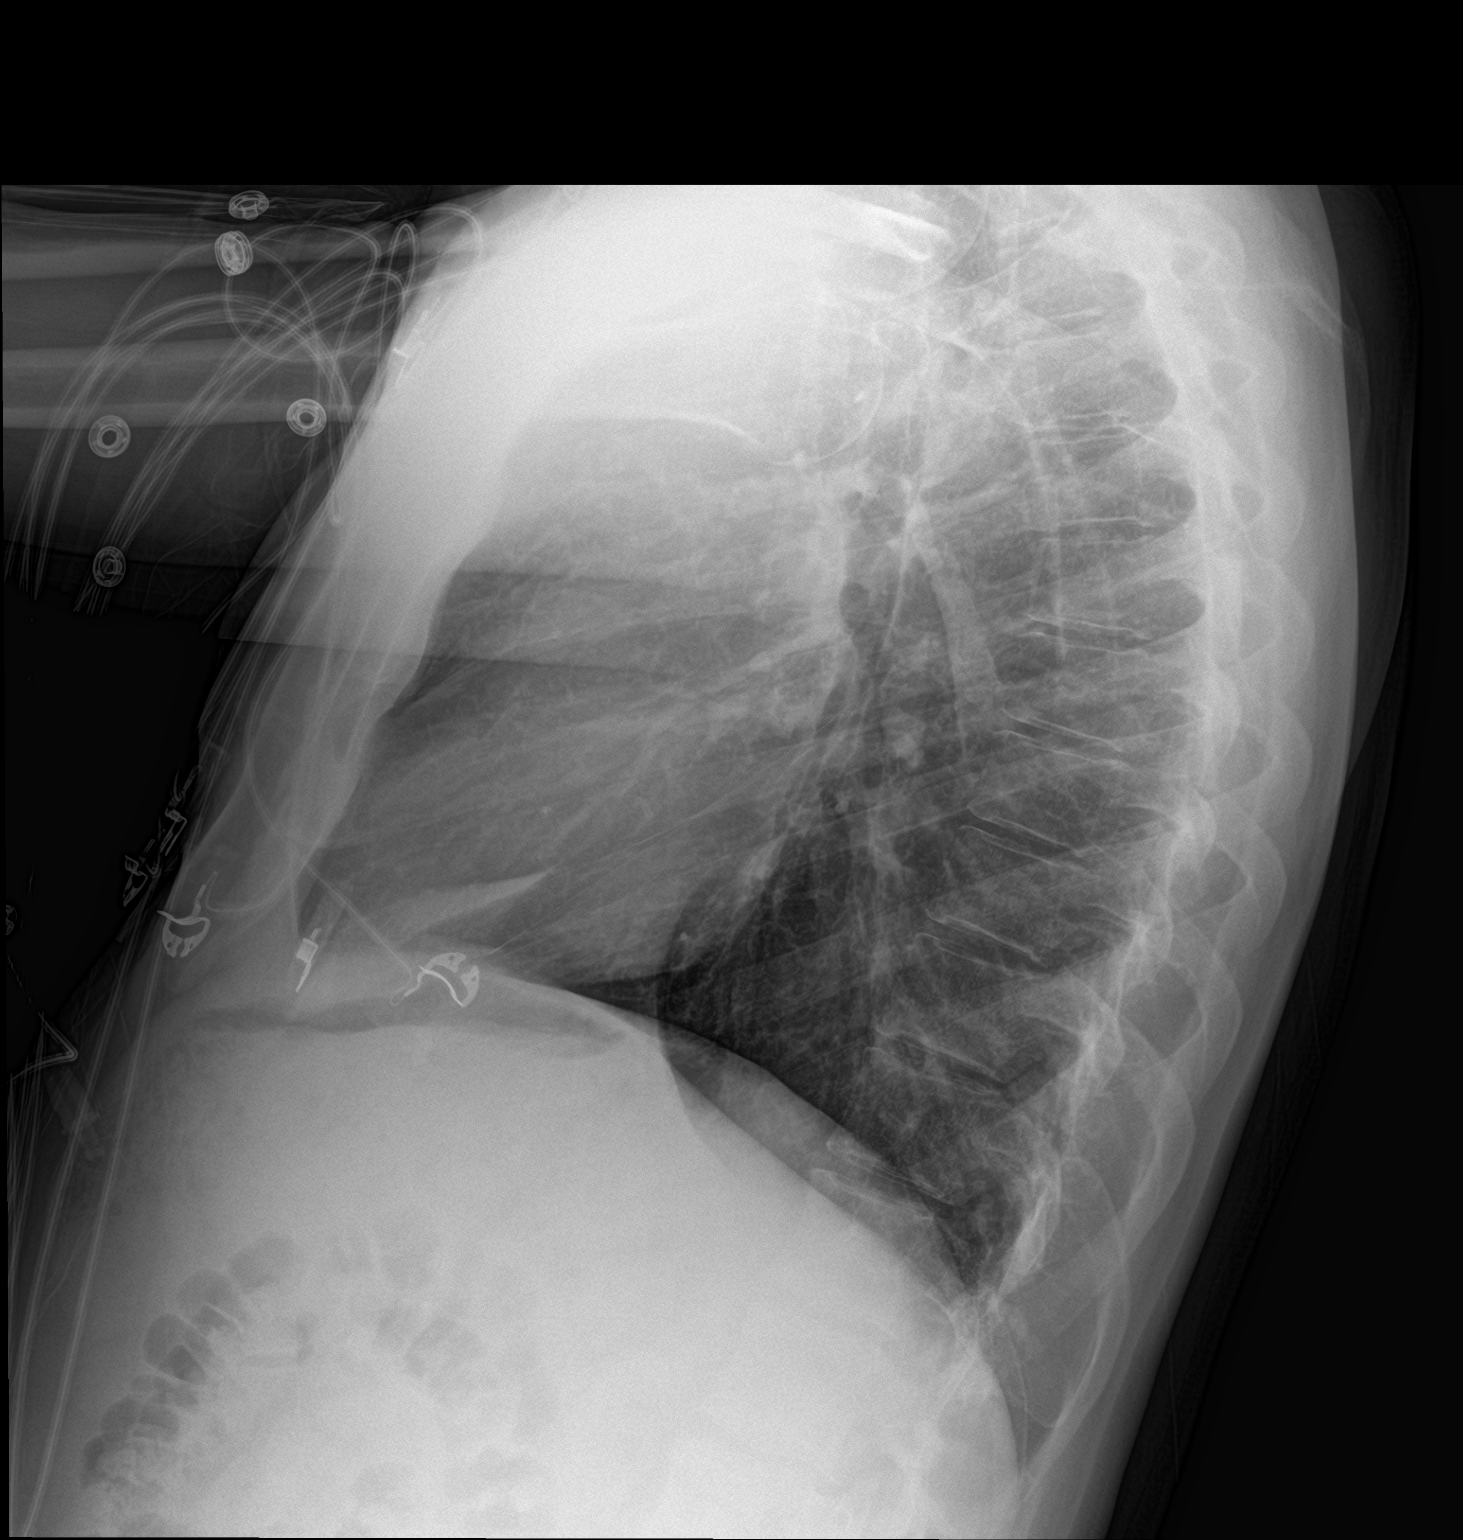

[2 of 2 positions shown; findings below may reference images not displayed]

FINDINGS: The heart size and mediastinal contours are within normal limits.
Both lungs are clear. The visualized skeletal structures are
unremarkable.
IMPRESSION: No active cardiopulmonary disease.

## 2018-07-15 MED ORDER — SODIUM CHLORIDE 0.9 % IV SOLN: INTRAVENOUS | Status: DC

## 2018-07-15 MED ORDER — SODIUM CHLORIDE 0.9 % IV BOLUS
1000.0000 mL | Freq: Once | INTRAVENOUS | Status: AC
  Administered 2018-07-15: 1000 mL via INTRAVENOUS

## 2018-07-15 MED ORDER — SODIUM CHLORIDE 0.9 % IV SOLN
1.0000 g | Freq: Once | INTRAVENOUS | Status: AC
  Administered 2018-07-15: 1 g via INTRAVENOUS
  Filled 2018-07-15: qty 10

## 2018-07-15 MED ORDER — DOXYCYCLINE HYCLATE 100 MG PO CAPS: 100.0000 mg | ORAL_CAPSULE | Freq: Two times a day (BID) | ORAL | 0 refills | Status: AC

## 2018-07-15 NOTE — Discharge Instructions (Addendum)
Urine culture is pending.  Take the doxycycline as directed for the next 14 days.  Work-up here today suggestive of prostate infection.  Return for any new or worse symptoms like fever chills abdominal pain.  Also for the chest pain recommend that you follow-up with cardiology.  Referral information above.  Make an appointment call on Monday.

## 2018-07-15 NOTE — ED Provider Notes (Signed)
[facility] - Inpatient EMERGENCY DEPARTMENT Provider Note   CSN: [id number] Arrival date & time: 07/15/18  1833     History   Chief Complaint Chief Complaint  Patient presents with  . Chest Pain    HPI Logan Bush is a 58 y.o. male.     Patient's main reason for presentation here tonight has been some hematuria and dysuria.  Discomfort down in the groin area.  Also had some discomfort in the right flank area.  Patient's had a history of kidney stones before.  Patient has had chest pain on and off its left-sided goes to the left arm none today.  But he has had that in the past.  Has not been able to get any work-up for that.  That is been going on for over a month.  No fevers no upper respiratory symptoms.  No shortness of breath.     Past Medical History:  Diagnosis Date  . Allergy   . Vertigo     Patient Active Problem List   Diagnosis Date Noted  . Abdominal pain 10/13/2017  . Change in bowel habits 05/12/2017  . RUQ pain 05/12/2017  . Palpitations 03/02/2017  . Osteoarthritis of left knee 03/02/2017  . Polyp of colon 09/16/2016    Past Surgical History:  Procedure Laterality Date  . COLONOSCOPY N/A 08/05/2017   Procedure: COLONOSCOPY;  Surgeon: Daneil Dolin, MD;  Location: [facility];  Service: Endoscopy;  Laterality: N/A;  1:45pm-rescheduled to 7/12 @9 :30am per Tretha Sciara  . POLYPECTOMY  10/2012  . POLYPECTOMY  08/05/2017   Procedure: POLYPECTOMY;  Surgeon: Daneil Dolin, MD;  Location: [facility];  Service: Endoscopy;;        Home Medications    Prior to Admission medications   Medication Sig Start Date End Date Taking? Authorizing Provider  ibuprofen (ADVIL) 200 MG tablet Take 200 mg by mouth every 6 (six) hours as needed for mild pain or moderate pain.   Yes [provider]  doxycycline (VIBRAMYCIN) 100 MG capsule Take 1 capsule (100 mg total) by mouth 2 (two) times daily for 14 days. 07/15/18 07/29/18  Fredia Sorrow, MD    Family History Family  History  Problem Relation Age of Onset  . Heart attack Mother   . Hypertension Mother   . Cancer Father        prostate cancer  . Asthma Father   . Cancer Sister        breast cancer  . Cancer Maternal Grandmother        Breast Cancer  . Colon cancer Neg Hx   . Colon polyps Neg Hx     Social History Social History   Tobacco Use  . Smoking status: Passive Smoke Exposure - Never Smoker  . Smokeless tobacco: Never Used  Substance Use Topics  . Alcohol use: No  . Drug use: No     Allergies   Aspirin   Review of Systems Review of Systems  Constitutional: Negative for chills and fever.  HENT: Negative for congestion, rhinorrhea and sore throat.   Eyes: Negative for visual disturbance.  Respiratory: Negative for cough and shortness of breath.   Cardiovascular: Positive for chest pain. Negative for leg swelling.  Gastrointestinal: Positive for abdominal pain. Negative for diarrhea, nausea and vomiting.  Genitourinary: Positive for dysuria, flank pain and hematuria.  Musculoskeletal: Negative for back pain and neck pain.  Skin: Negative for rash.  Neurological: Negative for dizziness, light-headedness and headaches.  Hematological: Does not bruise/bleed  easily.  Psychiatric/Behavioral: Negative for confusion.     Physical Exam Updated Vital Signs BP (!) 147/88 (BP Location: Right Arm)   Pulse 80   Temp 98.2 F (36.8 C) (Oral)   Resp 14   Ht 1.702 m (5\' 7" )   Wt 74.8 kg   SpO2 98%   BMI 25.84 kg/m   Physical Exam Vitals signs and nursing note reviewed.  Constitutional:      General: He is not in acute distress.    Appearance: Normal appearance. He is well-developed.  HENT:     Head: Normocephalic and atraumatic.  Eyes:     Extraocular Movements: Extraocular movements intact.     Conjunctiva/sclera: Conjunctivae normal.     Pupils: Pupils are equal, round, and reactive to light.  Neck:     Musculoskeletal: Neck supple.  Cardiovascular:     Rate and  Rhythm: Normal rate and regular rhythm.     Heart sounds: No murmur.  Pulmonary:     Effort: Pulmonary effort is normal. No respiratory distress.     Breath sounds: Normal breath sounds.  Abdominal:     General: Bowel sounds are normal.     Palpations: Abdomen is soft.     Tenderness: There is no abdominal tenderness.  Genitourinary:    Penis: Normal.      Comments: No discharge. Musculoskeletal: Normal range of motion.        General: No swelling.  Skin:    General: Skin is warm and dry.     Capillary Refill: Capillary refill takes less than 2 seconds.  Neurological:     General: No focal deficit present.     Mental Status: He is alert and oriented to person, place, and time.      ED Treatments / Results  Labs (all labs ordered are listed, but only abnormal results are displayed) Labs Reviewed  URINE CULTURE  BASIC METABOLIC PANEL  CBC  TROPONIN I  URINALYSIS, ROUTINE W REFLEX MICROSCOPIC    EKG EKG Interpretation  Date/Time:  Saturday July 15 2018 19:04:11 EDT Ventricular Rate:  76 PR Interval:    QRS Duration: 89 QT Interval:  403 QTC Calculation: 454 R Axis:   -38 Text Interpretation:  Sinus rhythm Inferior infarct, old No significant change since last tracing Confirmed by Vanetta MuldersZackowski, Trinetta Alemu [id number]) on 07/15/2018 7:16:16 PM   Radiology Dg Chest 2 View  Result Date: 07/15/2018 CLINICAL DATA:  Chest pain EXAM: CHEST - 2 VIEW COMPARISON:  02/04/2018 FINDINGS: The heart size and mediastinal contours are within normal limits. Both lungs are clear. The visualized skeletal structures are unremarkable. IMPRESSION: No active cardiopulmonary disease. Electronically Signed   By: Jasmine PangKim  Fujinaga M.D.   On: 07/15/2018 19:51   Ct Renal Stone Study  Result Date: 07/15/2018 CLINICAL DATA:  Flank pain EXAM: CT ABDOMEN AND PELVIS WITHOUT CONTRAST TECHNIQUE: Multidetector CT imaging of the abdomen and pelvis was performed following the standard protocol without IV contrast.  COMPARISON:  CT 09/12/2017 FINDINGS: Lower chest: No acute abnormality. Hepatobiliary: Cyst in the right hepatic lobe. No calcified gallstones or biliary dilatation. Pancreas: Unremarkable. No pancreatic ductal dilatation or surrounding inflammatory changes. Spleen: Normal in size without focal abnormality. Adrenals/Urinary Tract: Adrenal glands are normal. No hydronephrosis. Cyst mid to lower left kidney. Distended urinary bladder. Stomach/Bowel: Stomach is within normal limits. Appendix appears normal. No evidence of bowel wall thickening, distention, or inflammatory changes. Sigmoid colon diverticular disease without acute inflammatory process. Vascular/Lymphatic: No significant vascular findings are present. No enlarged  abdominal or pelvic lymph nodes. Reproductive: Hazy attenuation around the prostate and seminal vesicles. Other: Negative for free air or free fluid. Musculoskeletal: No acute or significant osseous findings. IMPRESSION: 1. Hazy infiltration of the fat planes around the prostate and seminal vesicles, question inflammatory process/prostatitis. 2. Negative for hydronephrosis or ureteral stone. 3. Mild sigmoid colon diverticular disease without acute inflammatory process Electronically Signed   By: Jasmine PangKim  Fujinaga M.D.   On: 07/15/2018 21:05    Procedures Procedures (including critical care time)  Medications Ordered in ED Medications  0.9 %  sodium chloride infusion (has no administration in time range)  sodium chloride 0.9 % bolus 1,000 mL (0 mLs Intravenous Stopped 07/15/18 2306)  cefTRIAXone (ROCEPHIN) 1 g in sodium chloride 0.9 % 100 mL IVPB (1 g Intravenous New Bag/Given 07/15/18 2306)     Initial Impression / Assessment and Plan / ED Course  I have reviewed the triage vital signs and the nursing notes.  Pertinent labs & imaging results that were available during my care of the patient were reviewed by me and considered in my medical decision making (see chart for details).         Patient without any chest pain recently.  Troponin test was negative.  No chest pain now.  Will refer him to cardiology for further evaluation of the chest pain.  Patient's CT scan suggestive of prostatitis.  Urinalysis pending urine culture will be sent.  Patient given dose of Rocephin for this and be continued on doxycycline.  This probably explains his symptoms with the hematuria and some of the flank discomfort.  No signs of any kidney stone.  Patient will be treated with doxycycline for the next 2 weeks.  He will return for any new or worse symptoms.  Will call cardiology for follow-up.  Final Clinical Impressions(s) / ED Diagnoses   Final diagnoses:  Acute prostatitis  Precordial pain    ED Discharge Orders         Ordered    doxycycline (VIBRAMYCIN) 100 MG capsule  2 times daily     07/15/18 2323           Vanetta MuldersZackowski, Maxie Slovacek, MD 07/15/18 2340

## 2018-07-15 NOTE — ED Triage Notes (Signed)
Pt c/o LT sided CP that radiates down LT arm x 1 month. Pt states he attempted to make an appointment with [facility] when pain started, but they would not see him due to Covid. Pt states CP is intermittent. Today pt began have hematuria and feeling bloated.

## 2018-07-18 LAB — URINE CULTURE: Culture: >=100000 — AB

## 2018-07-19 ENCOUNTER — Telehealth: Payer: Self-pay | Admitting: Emergency Medicine

## 2018-07-19 NOTE — Progress Notes (Signed)
ED Antimicrobial Stewardship Positive Culture Follow Up   Logan Bush is an 58 y.o. male who presented to [facility] on 07/15/2018 with a chief complaint of  Chief Complaint  Patient presents with  . Chest Pain    Recent Results (from the past 720 hour(s))  Urine Culture     Status: Abnormal   Collection Time: 07/15/18 11:10 PM   Specimen: Urine, Clean Catch  Result Value Ref Range Status   Specimen Description   Final    URINE, CLEAN CATCH Performed at [facility], [address], [place], [place] [address]    Special Requests   Final    NONE Performed at [facility], [address] [place], [place] [address]    Culture >=100,000 COLONIES/mL ESCHERICHIA COLI (A)  Final   Report Status 07/18/2018 FINAL  Final   Organism ID, Bacteria ESCHERICHIA COLI (A)  Final      Susceptibility   Escherichia coli - MIC*    AMPICILLIN <=2 SENSITIVE Sensitive     CEFAZOLIN <=4 SENSITIVE Sensitive     CEFTRIAXONE <=1 SENSITIVE Sensitive     CIPROFLOXACIN <=0.25 SENSITIVE Sensitive     GENTAMICIN <=1 SENSITIVE Sensitive     IMIPENEM <=0.25 SENSITIVE Sensitive     NITROFURANTOIN <=16 SENSITIVE Sensitive     TRIMETH/SULFA <=20 SENSITIVE Sensitive     AMPICILLIN/SULBACTAM <=2 SENSITIVE Sensitive     PIP/TAZO <=4 SENSITIVE Sensitive     Extended ESBL NEGATIVE Sensitive     * >=100,000 COLONIES/mL ESCHERICHIA COLI   Presented with hematuria, dysuria, and discomfort in R flank area (hx kidney stones). CT scan suggestive of prostatitis. UA + in ED. Received 1 dose IV ceftriaxone and sent home on doxycycline. UCx growing E Coli (pan-sensitive).  [x]  Treated with doxycycline, organism resistant to prescribed antimicrobial []  Patient discharged originally without antimicrobial agent and treatment is now indicated  New antibiotic prescription: Bactrim DS 1 tablet twice daily for 10 days - discontinue doxycycline.  ED Provider: Margarita Mail, PA-C    [name], PharmD, [place] 07/19/2018, 8:51  AM Clinical Pharmacist Monday - Friday phone -  [phone] Saturday - Sunday phone - [phone]

## 2018-07-19 NOTE — Telephone Encounter (Signed)
Post ED Visit - Positive Culture Follow-up: Successful Patient Follow-Up  Culture assessed and recommendations reviewed by:  []  Elenor Quinones, Pharm.D. []  Heide Guile, Pharm.D., BCPS AQ-ID []  [place]ks [place]une, Pharm.D., BCPS []  Alycia Rossetti, Pharm.D., BCPS []  [place], Pharm.D., BCPS, AAHIVP []  Legrand Como, Pharm.D., BCPS, AAHIVP []  Salome Arnt, PharmD, BCPS []  Johnnette Gourd, PharmD, BCPS []  Hughes Better, PharmD, BCPS []  Leeroy Cha, PharmD Gorden Harms PharmD  Positive urine culture  []  Patient discharged without antimicrobial prescription and treatment is now indicated [x]  Organism is resistant to prescribed ED discharge antimicrobial []  Patient with positive blood cultures  Changes discussed with ED provider: Margarita Mail PA New antibiotic prescription d/c doxycycline, start Bactrim (800-160mg ) 1 DS twice daily x 10 days Called to [facility] [phone]    Hazle Nordmann 07/19/2018, 11:49 AM

## 2019-01-22 ENCOUNTER — Ambulatory Visit: Payer: Self-pay | Attending: Internal Medicine

## 2019-01-22 ENCOUNTER — Other Ambulatory Visit: Payer: Self-pay

## 2019-01-22 DIAGNOSIS — Z20822 Contact with and (suspected) exposure to covid-19: Secondary | ICD-10-CM

## 2019-01-24 LAB — NOVEL CORONAVIRUS, NAA: SARS-CoV-2, NAA: NOT DETECTED

## 2019-03-31 ENCOUNTER — Ambulatory Visit: Payer: Self-pay

## 2019-05-30 ENCOUNTER — Other Ambulatory Visit (HOSPITAL_COMMUNITY): Payer: Self-pay | Admitting: Nurse Practitioner

## 2019-05-30 DIAGNOSIS — S0990XA Unspecified injury of head, initial encounter: Secondary | ICD-10-CM

## 2019-05-31 ENCOUNTER — Other Ambulatory Visit: Payer: Self-pay

## 2019-05-31 ENCOUNTER — Ambulatory Visit (HOSPITAL_COMMUNITY)
Admission: RE | Admit: 2019-05-31 | Discharge: 2019-05-31 | Disposition: A | Discharge status: Home / Self Care | Payer: Self-pay | Source: Ambulatory Visit | Attending: Nurse Practitioner | Admitting: Nurse Practitioner

## 2019-05-31 DIAGNOSIS — S0990XA Unspecified injury of head, initial encounter: Secondary | ICD-10-CM | POA: Insufficient documentation

## 2019-05-31 IMAGING — CT CT HEAD WO/W CM
3 of 4 series · 15 of 47 positions shown, 18 images · IV contrast (omnipaque)
Comparison: None.

CLINICAL DATA: Head injury, initial encounter.

EXAM:
CT HEAD WITHOUT AND WITH CONTRAST
TECHNIQUE: Contiguous axial images were obtained from the base of the skull
through the vertex without and with intravenous contrast
CONTRAST:  75mL OMNIPAQUE IOHEXOL 300 MG/ML  SOLN

[Series 2: head w o · axial · 0.43mm/px · z∈[+48,+188]mm · 9 of 34 slices shown, 12 images]
[im 3/34  brain]
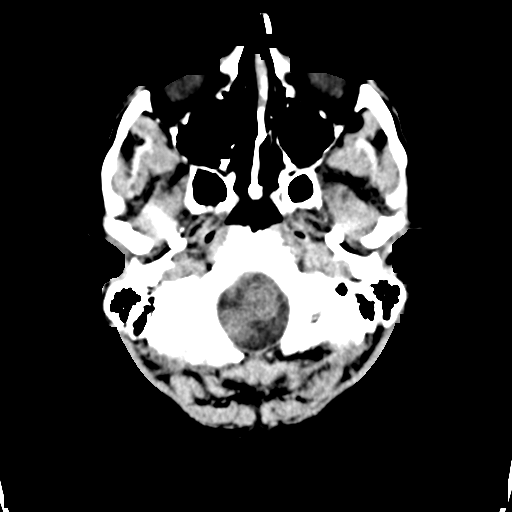
[im 3/34  bone]
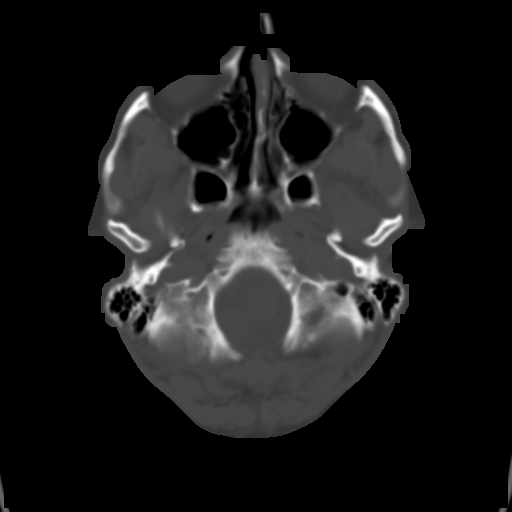
[im 8/34  brain]
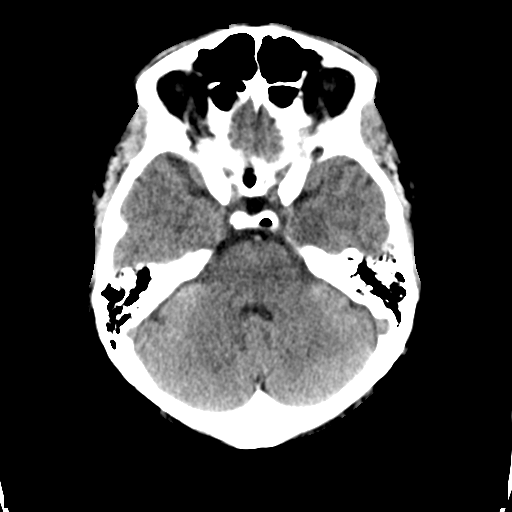
[im 10/34  brain]
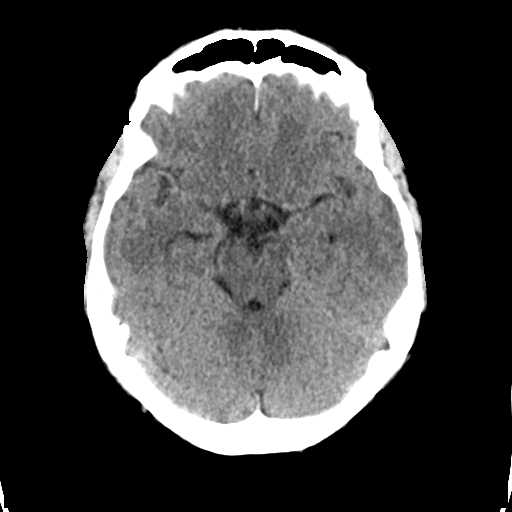
[im 15/34  brain]
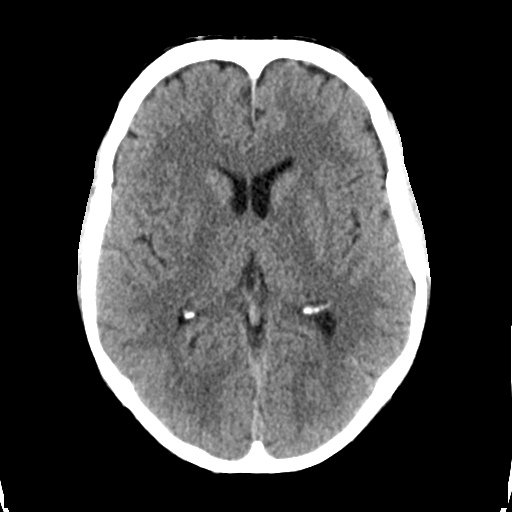
[im 17/34  brain]
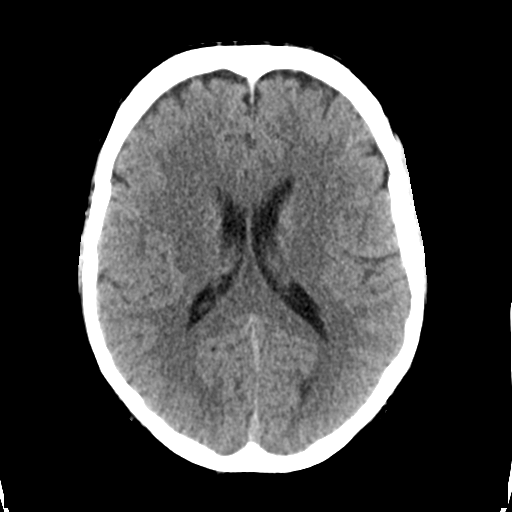
[im 17/34  bone]
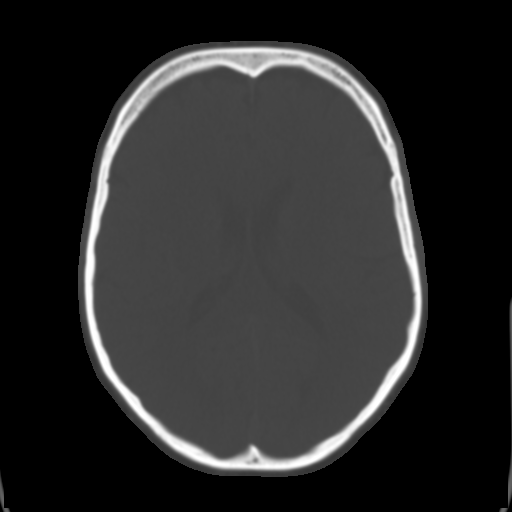
[im 19/34  brain]
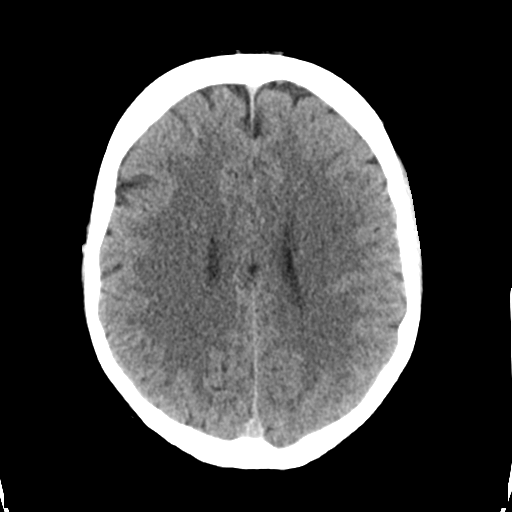
[im 24/34  brain]
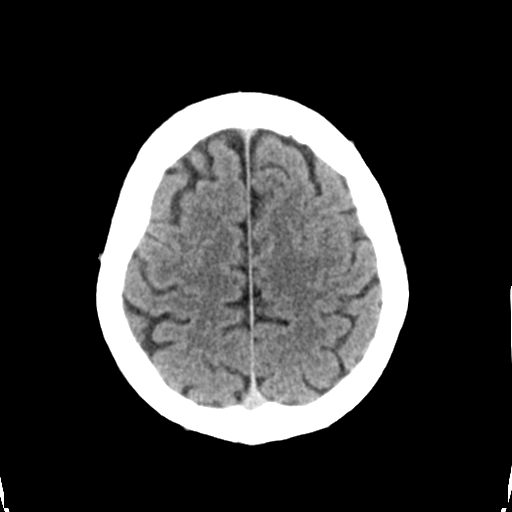
[im 26/34  brain]
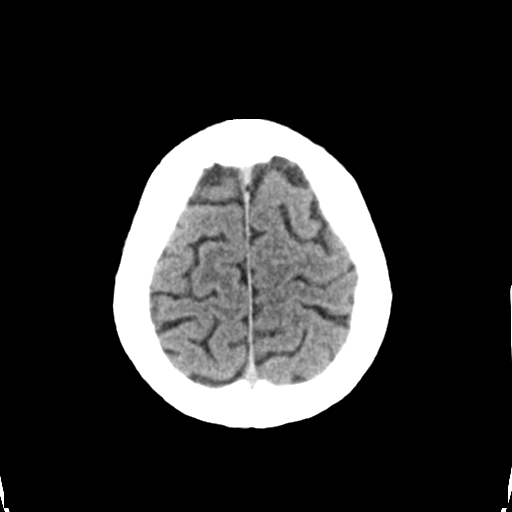
[im 31/34  brain]
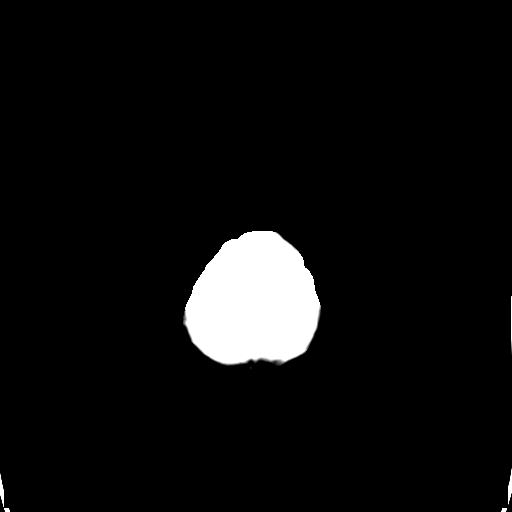
[im 31/34  bone]
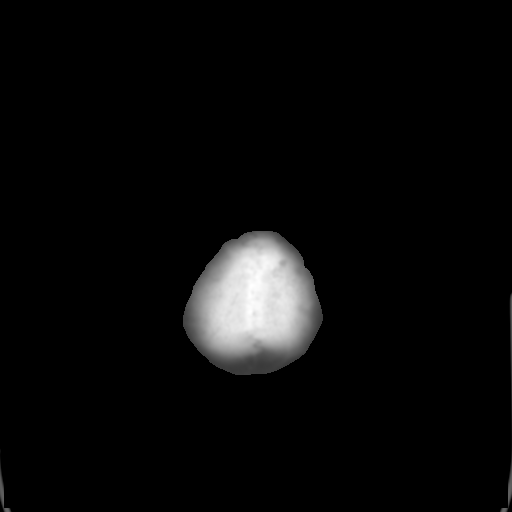

[Series 5: coronal soft · coronal · 0.34mm/px · 3 of 72 slices shown]
[im 24/72  brain]
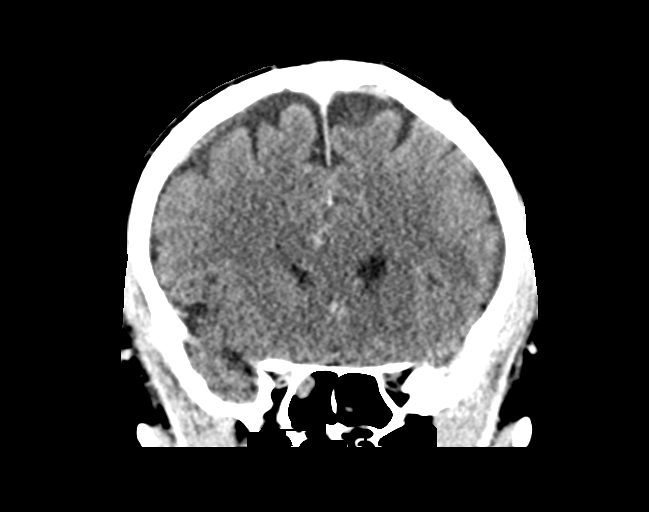
[im 32/72  brain]
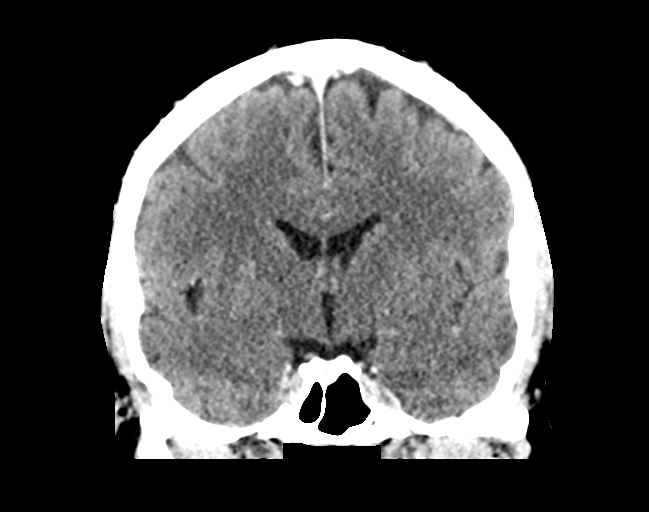
[im 40/72  brain]
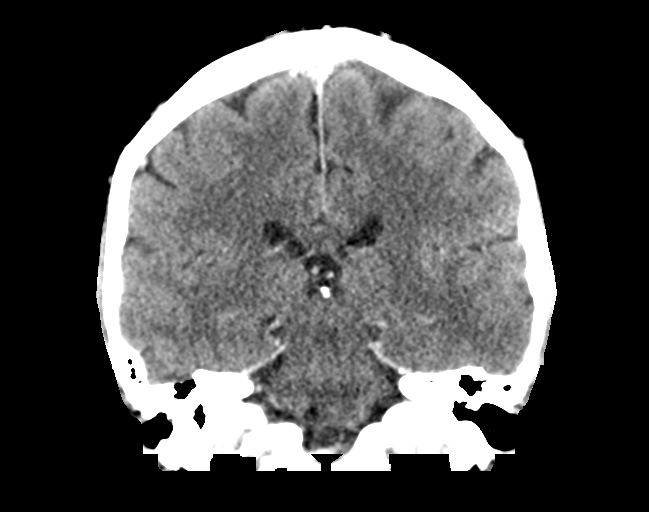

[Series 6: sagittal soft · sagittal · 0.34mm/px · 3 of 67 slices shown]
[im 23/67  brain]
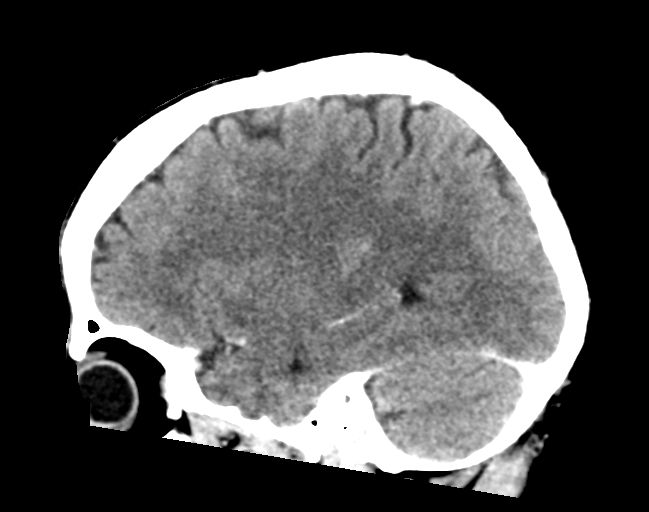
[im 34/67  brain]
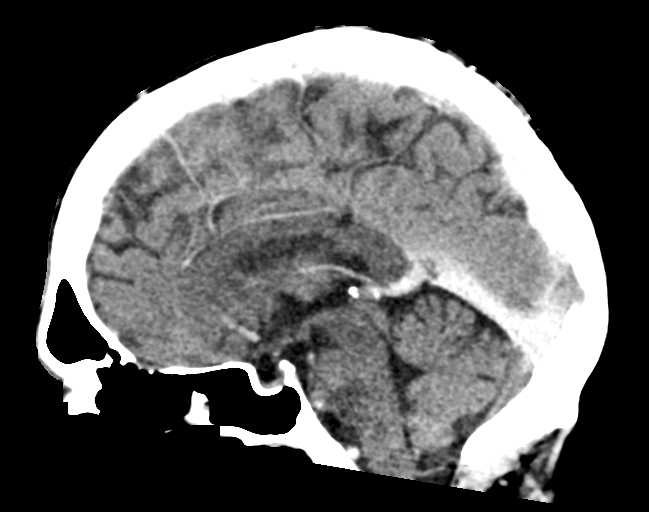
[im 45/67  brain]
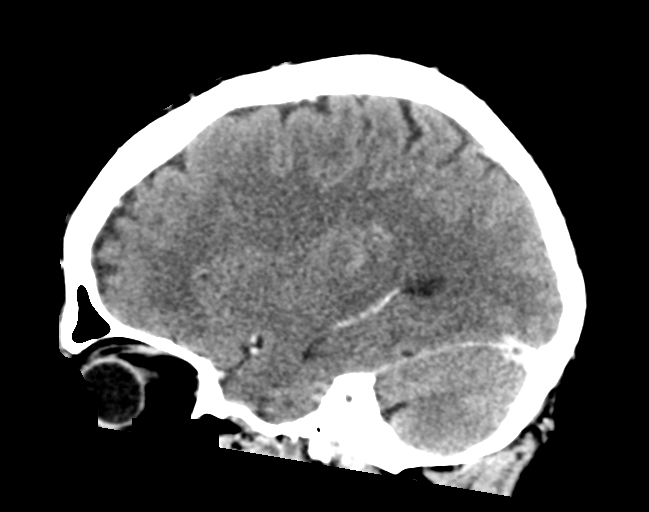

[15 of 47 positions shown; findings below may reference images not displayed]

FINDINGS: Brain: No acute infarct or intracranial hemorrhage. No mass lesion.
No midline shift, ventriculomegaly or extra-axial fluid collection.
No abnormal enhancement.

Vascular: No hyperdense vessel or unexpected calcification.
Visualized vessels are patent.

Skull: Negative for fracture or focal lesion.

Sinuses/Orbits: Normal orbits. Right sphenoid and left ethmoid
mucous retention cysts. No mastoid effusion.

Other: None.
IMPRESSION: Normal head CT.

## 2019-05-31 MED ORDER — IOHEXOL 300 MG/ML  SOLN
75.0000 mL | Freq: Once | INTRAMUSCULAR | Status: AC | PRN
  Administered 2019-05-31: 14:00:00 75 mL via INTRAVENOUS

## 2019-06-12 ENCOUNTER — Ambulatory Visit (HOSPITAL_COMMUNITY): Payer: Self-pay

## 2019-08-01 ENCOUNTER — Other Ambulatory Visit: Payer: Self-pay

## 2019-08-01 ENCOUNTER — Emergency Department (HOSPITAL_COMMUNITY): Payer: Self-pay

## 2019-08-01 ENCOUNTER — Emergency Department (HOSPITAL_COMMUNITY)
Admission: EM | Admit: 2019-08-01 | Discharge: 2019-08-01 | Disposition: A | Discharge status: Home / Self Care | Payer: Self-pay | Attending: Emergency Medicine | Admitting: Emergency Medicine

## 2019-08-01 ENCOUNTER — Encounter (HOSPITAL_COMMUNITY): Payer: Self-pay | Admitting: Emergency Medicine

## 2019-08-01 DIAGNOSIS — Z7722 Contact with and (suspected) exposure to environmental tobacco smoke (acute) (chronic): Secondary | ICD-10-CM | POA: Insufficient documentation

## 2019-08-01 DIAGNOSIS — I82811 Embolism and thrombosis of superficial veins of right lower extremities: Secondary | ICD-10-CM

## 2019-08-01 DIAGNOSIS — R202 Paresthesia of skin: Secondary | ICD-10-CM | POA: Insufficient documentation

## 2019-08-01 DIAGNOSIS — I82819 Embolism and thrombosis of superficial veins of unspecified lower extremities: Secondary | ICD-10-CM | POA: Insufficient documentation

## 2019-08-01 DIAGNOSIS — M549 Dorsalgia, unspecified: Secondary | ICD-10-CM | POA: Insufficient documentation

## 2019-08-01 IMAGING — DX DG LUMBAR SPINE COMPLETE 4+V
5 series · 5 of 5 positions shown · non-contrast
Comparison: None.

CLINICAL DATA: Low back pain

EXAM:
LUMBAR SPINE - COMPLETE 4+ VIEW

[l-spine ap]
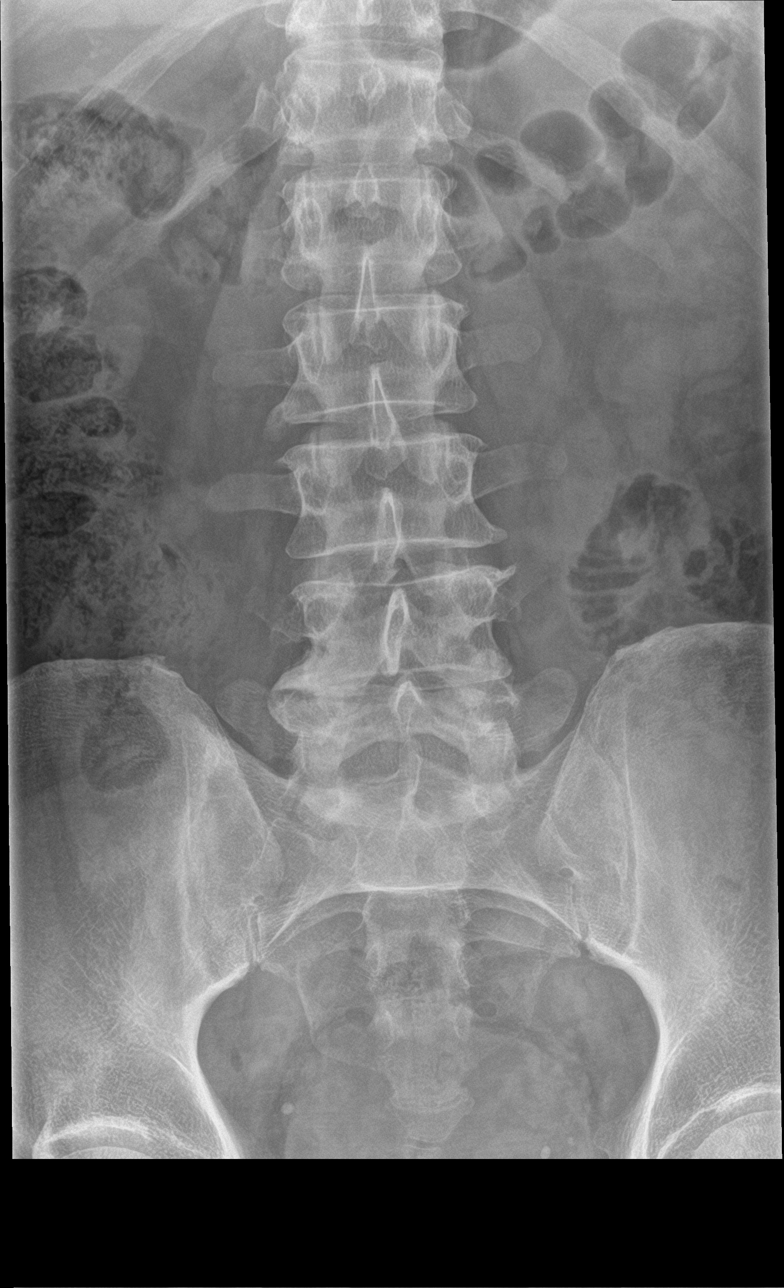

[l-spine obl (1 of 2)]
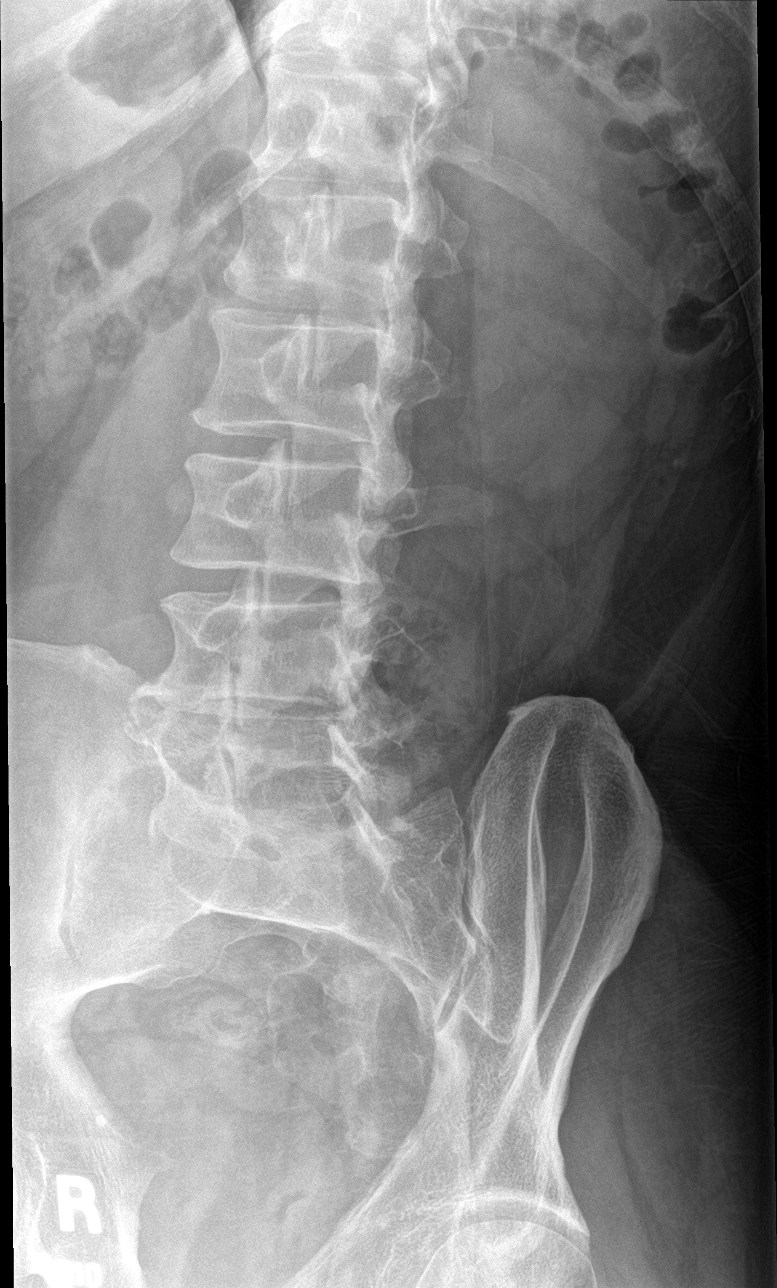

[l-spine obl (2 of 2)]
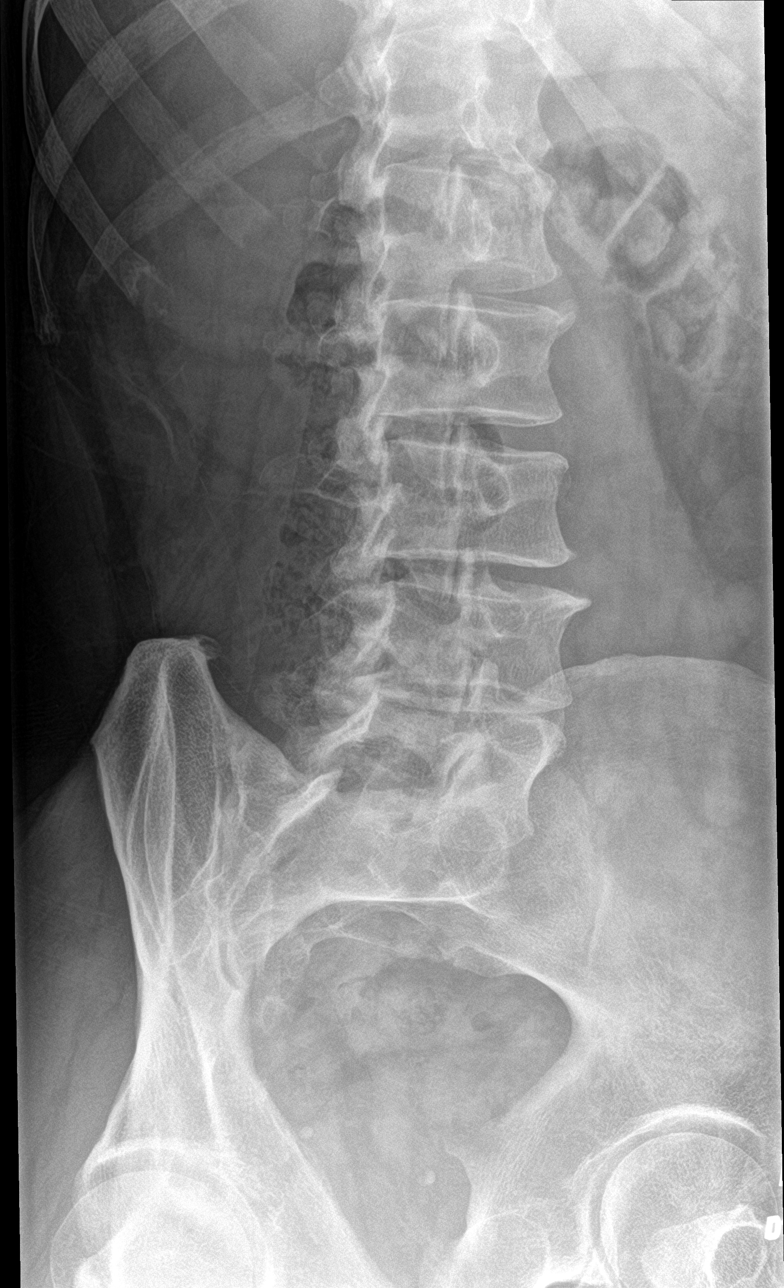

[l-spine lat]
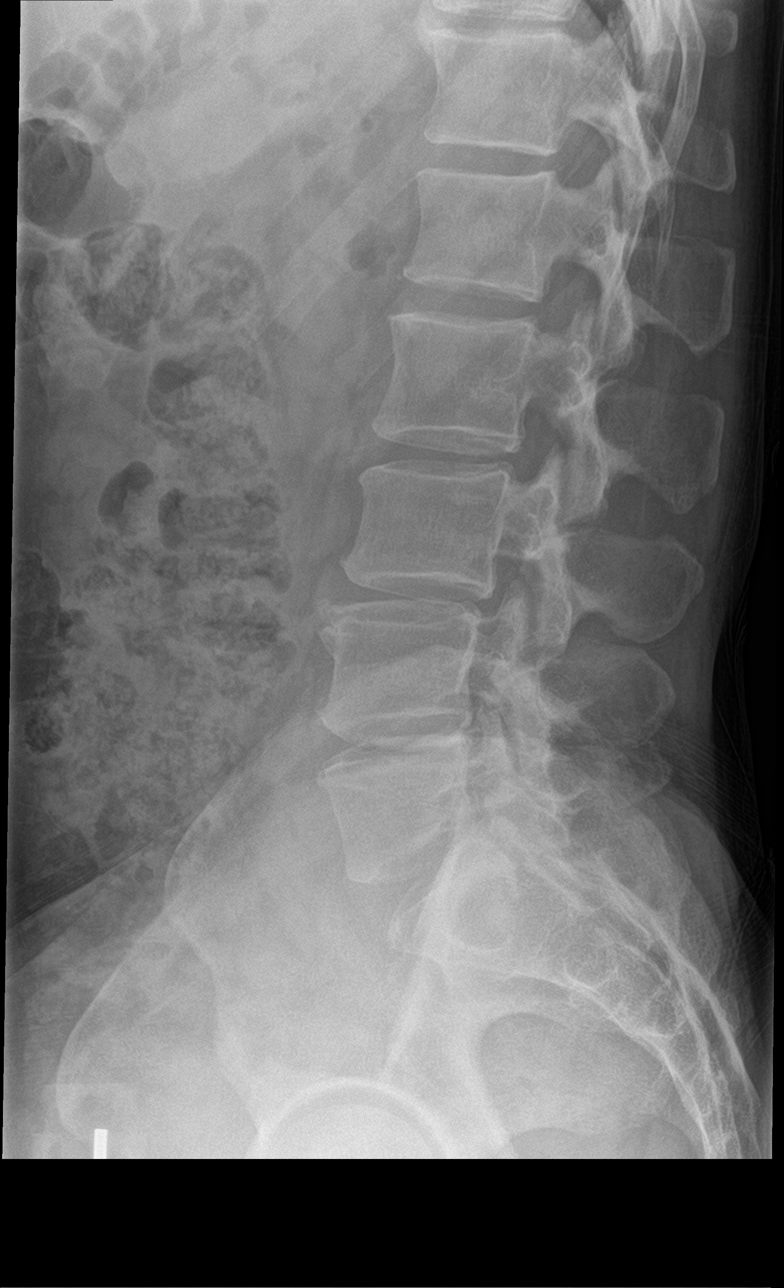

[l-spine spot]
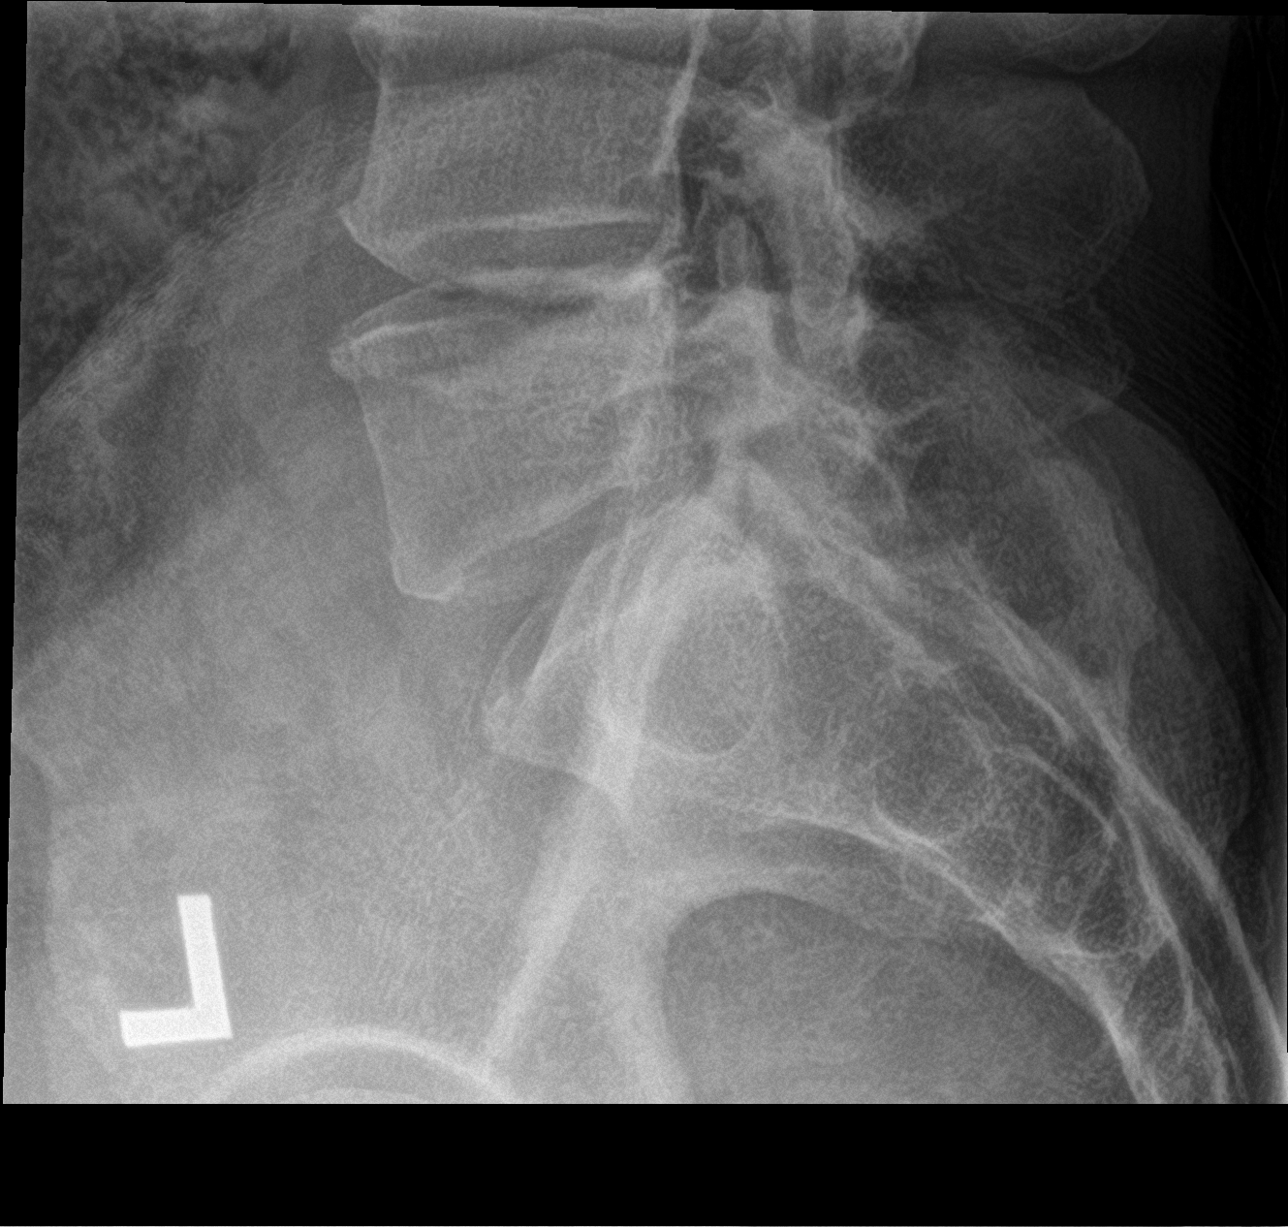

[5 of 5 positions shown; findings below may reference images not displayed]

FINDINGS: Frontal, lateral, spot lumbosacral lateral, and bilateral oblique
views were obtained. There are 5 non-rib-bearing lumbar type
vertebral bodies. There is no fracture or spondylolisthesis. There
is moderate disc space narrowing at L4-5 with slightly milder disc
space narrowing at L2-3 and L3-4. There is facet osteoarthritic
change at L4-5 and L5-S1 bilaterally. No erosive change evident.
IMPRESSION: Osteoarthritic change at several levels. No fracture or
spondylolisthesis.

## 2019-08-01 IMAGING — US US EXTREM LOW VENOUS*R*
1 series · 13 of 24 positions shown · non-contrast
Comparison: None.

CLINICAL DATA: Right lower extremity pain

EXAM:
RIGHT LOWER EXTREMITY VENOUS DUPLEX ULTRASOUND
TECHNIQUE: Gray-scale sonography with graded compression, as well as color
Doppler and duplex ultrasound were performed to evaluate the right
lower extremity deep venous system from the level of the common
femoral vein and including the common femoral, femoral, profunda
femoral, popliteal and calf veins including the posterior tibial,
peroneal and gastrocnemius veins when visible. The superficial great
saphenous vein was also interrogated. Spectral Doppler was utilized
to evaluate flow at rest and with distal augmentation maneuvers in
the common femoral, femoral and popliteal veins.

[Series 1: us extrem low venous*right* · 0.07mm/px · 13 of 50 slices shown]
[im 1/50]
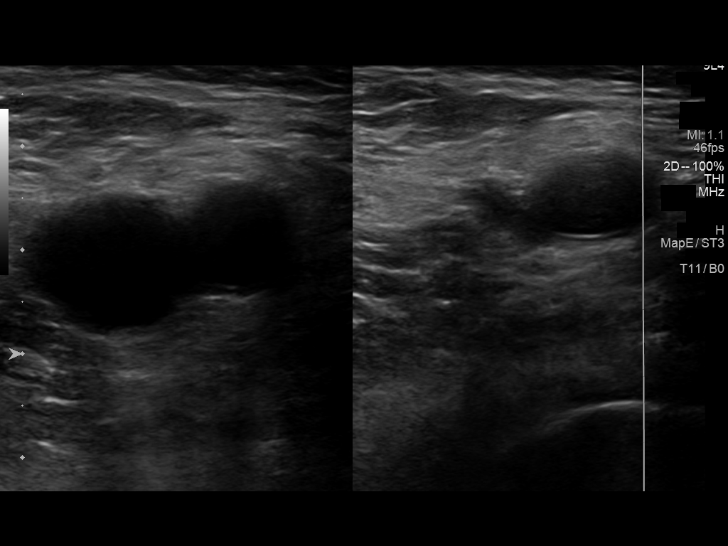
[im 5/50]
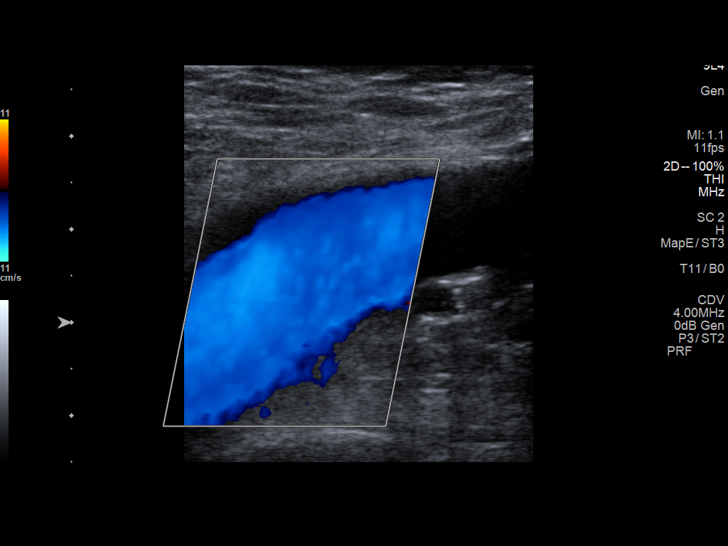
[im 9/50]
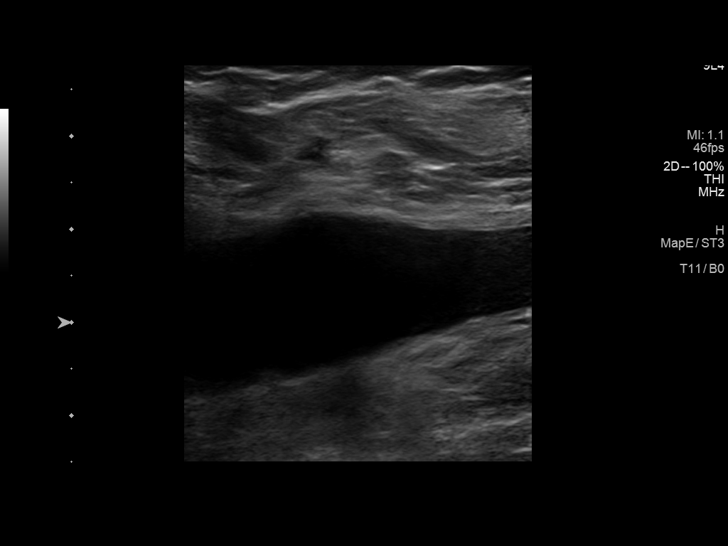
[im 13/50]
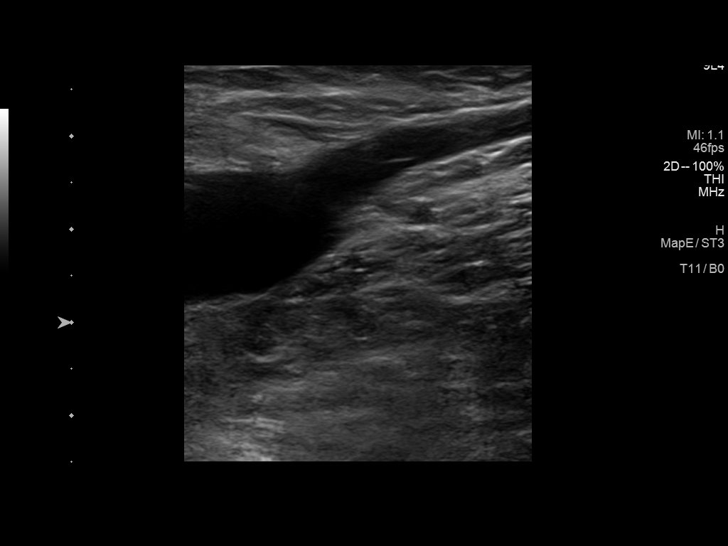
[im 18/50]
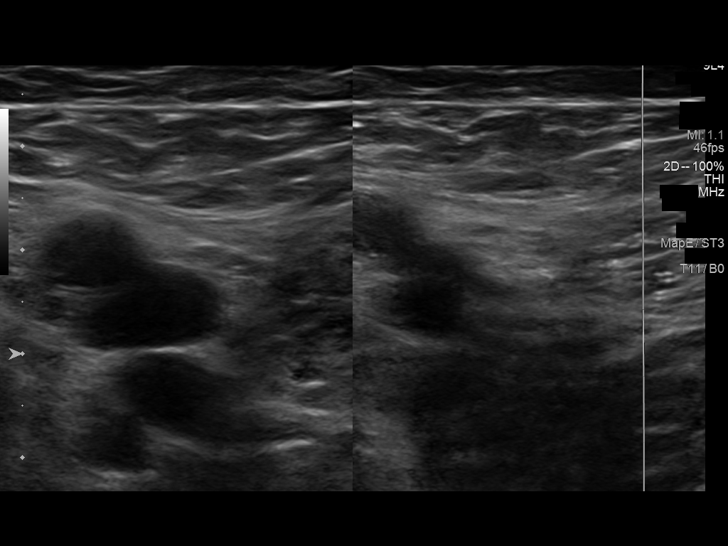
[im 22/50]
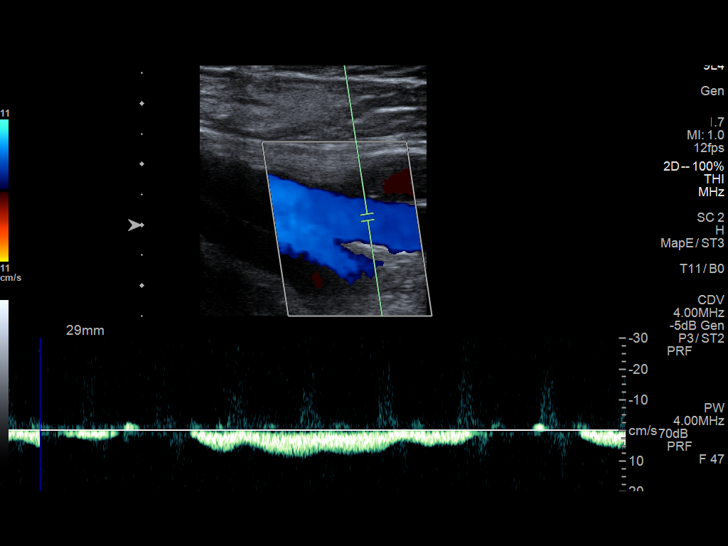
[im 26/50]
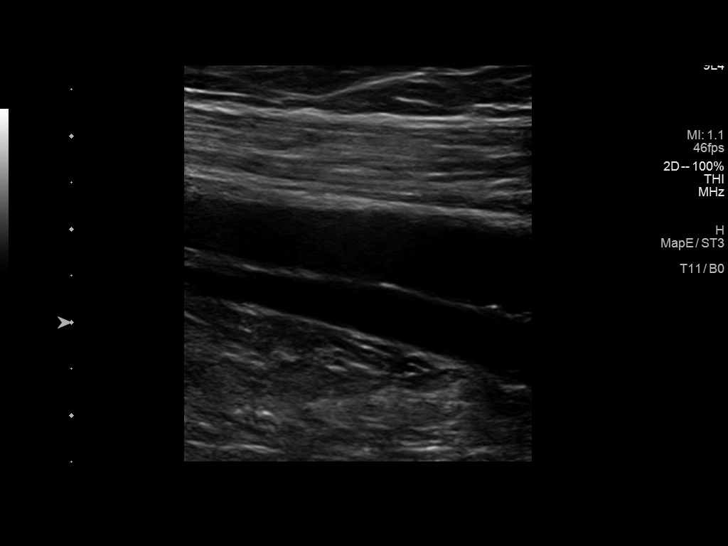
[im 28/50]
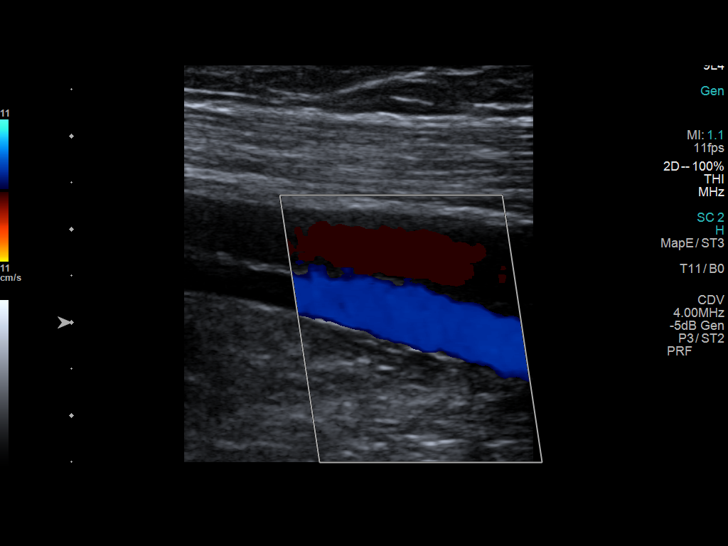
[im 32/50]
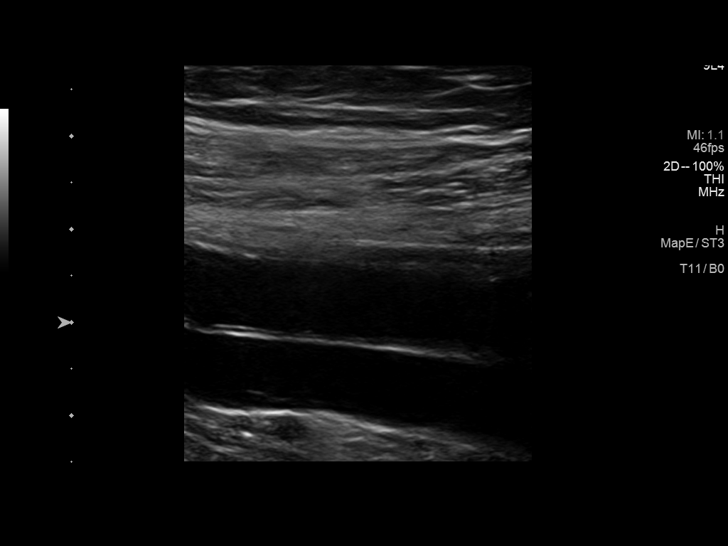
[im 37/50]
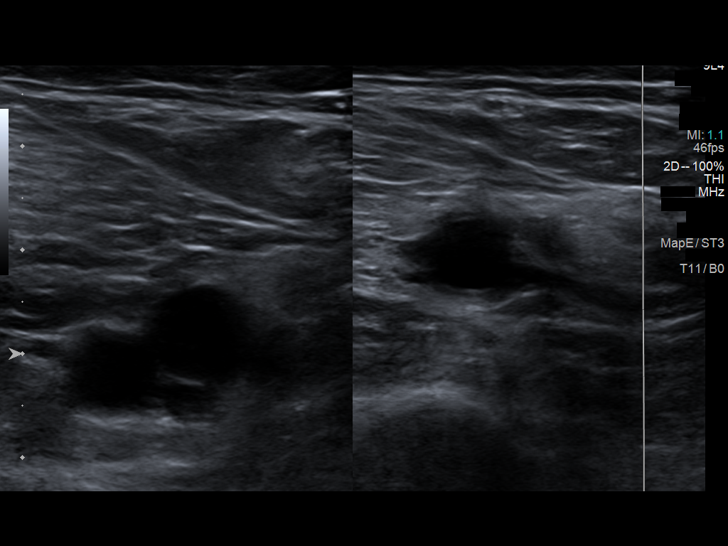
[im 41/50]
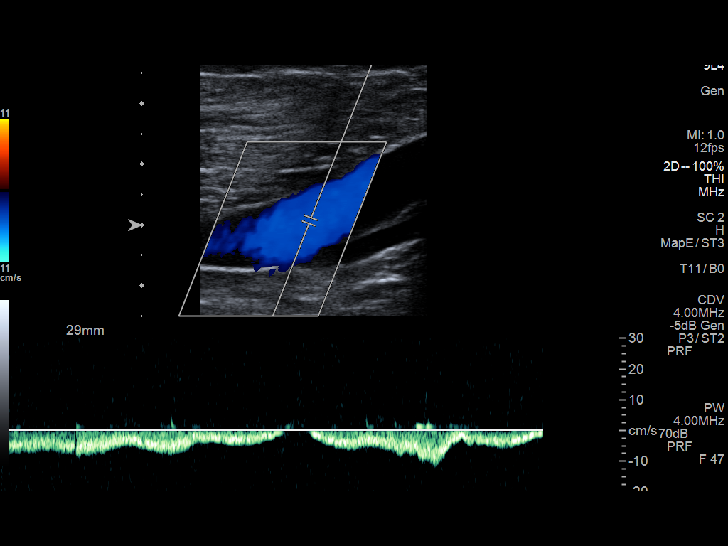
[im 45/50]
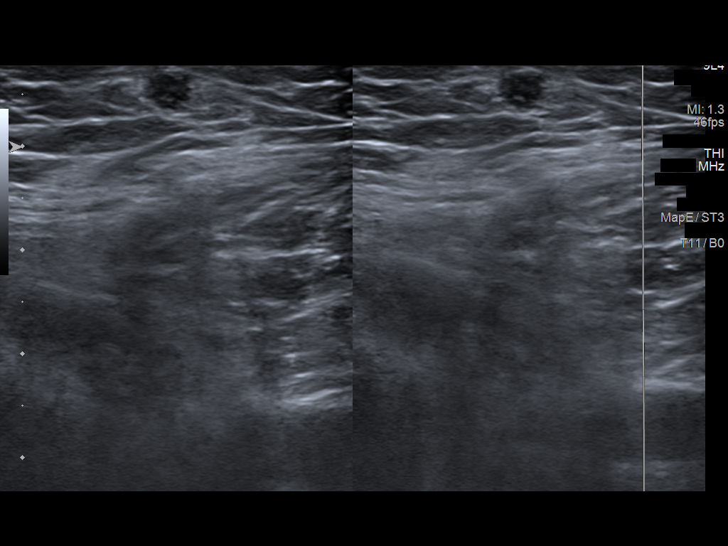
[im 50/50]
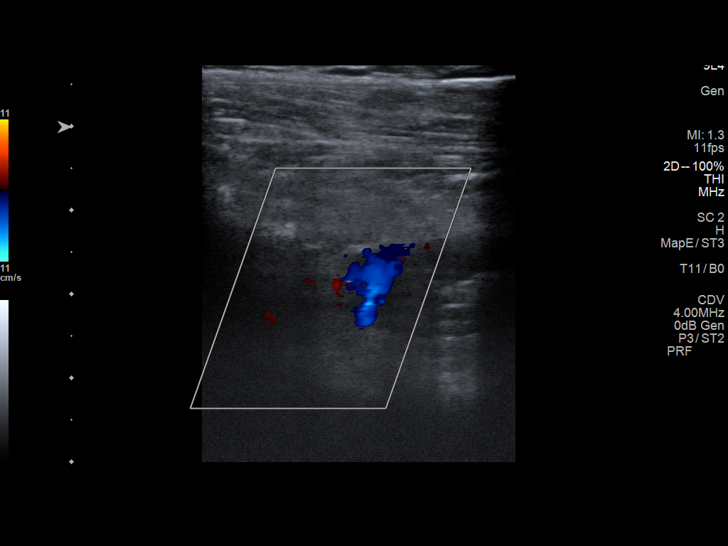

[13 of 24 positions shown; findings below may reference images not displayed]

FINDINGS: Contralateral Common Femoral Vein: Respiratory phasicity is normal
and symmetric with the symptomatic side. No evidence of thrombus.
Normal compressibility.

Common Femoral Vein: No evidence of thrombus. Normal
compressibility, respiratory phasicity and response to augmentation.

Saphenofemoral Junction: No evidence of thrombus. Normal
compressibility and flow on color Doppler imaging.

Profunda Femoral Vein: No evidence of thrombus. Normal
compressibility and flow on color Doppler imaging.

Femoral Vein: No evidence of thrombus. Normal compressibility,
respiratory phasicity and response to augmentation.

Popliteal Vein: No evidence of thrombus. Normal compressibility,
respiratory phasicity and response to augmentation.

Calf Veins: No evidence of thrombus. Normal compressibility and flow
on color Doppler imaging.

Superficial Great Saphenous Vein: Acute appearing thrombus is noted
throughout much of the right greater saphenous vein from the knee
region medially to the level of the ankle. There is essentially
absent flow in this area with loss of compression and Doppler
signal.

Venous Reflux:  None.

Other Findings:  None.
IMPRESSION: No evidence of deep venous thrombosis in the right lower extremity.
There is extensive greater saphenous vein thrombus medially from the
knee region to the ankle, a superficial venous structure. This
thrombus does not approach the saphenofemoral junction on the right.

Left common femoral vein patent.

These results will be called to the ordering clinician or
representative by the Radiologist Assistant, and communication
documented in the PACS or [REDACTED].

## 2019-08-01 IMAGING — DX DG TIBIA/FIBULA 2V*R*
4 series · 4 of 4 positions shown · non-contrast
Comparison: None.

CLINICAL DATA: Pain

EXAM:
RIGHT TIBIA AND FIBULA - 2 VIEW

[tibia ap (1 of 2)]
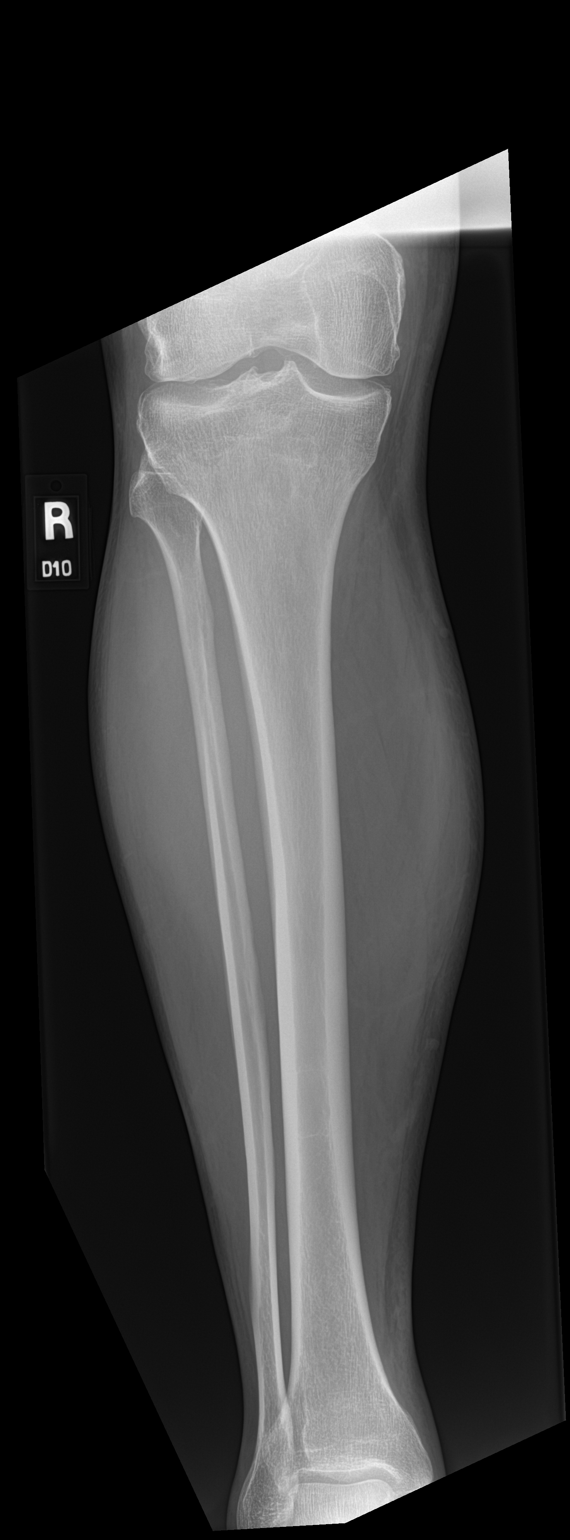

[tibia ap (2 of 2)]
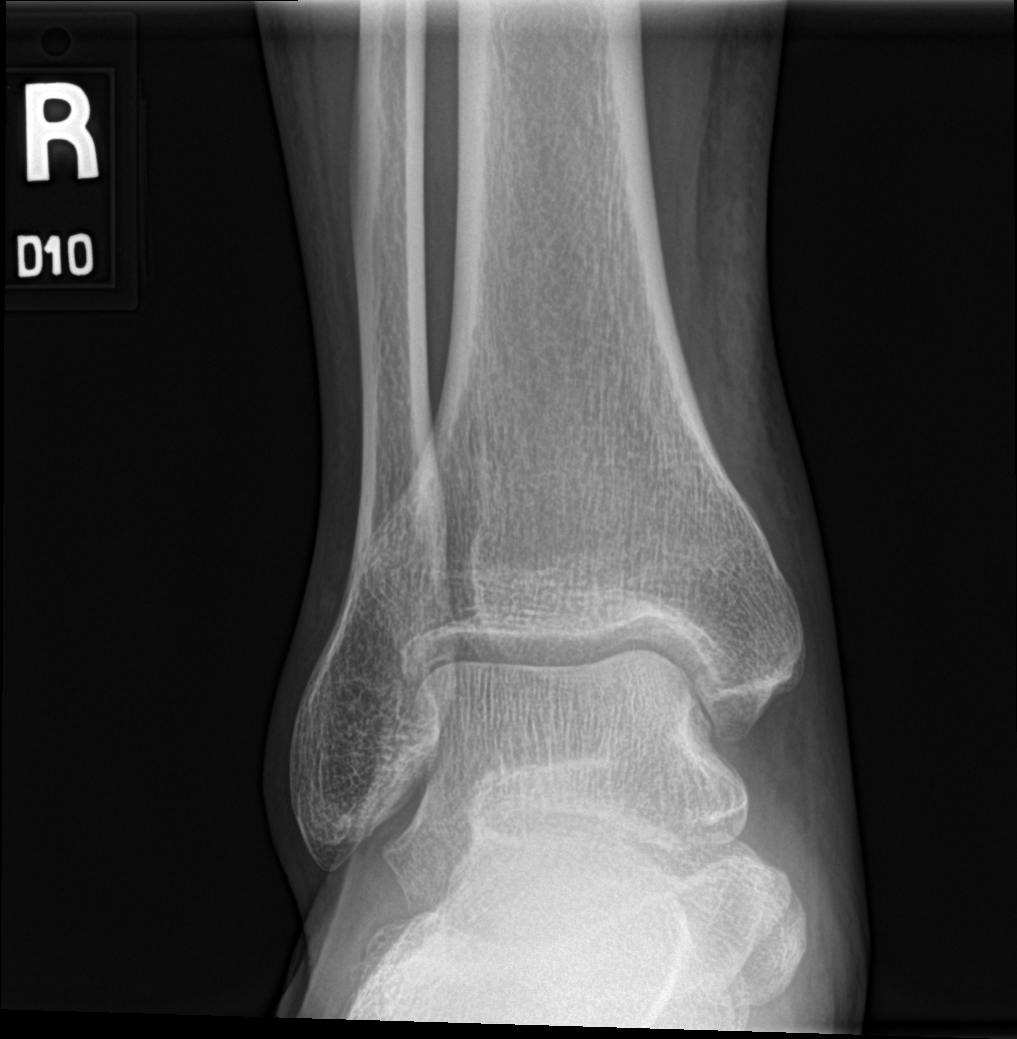

[tibia lat (1 of 2)]
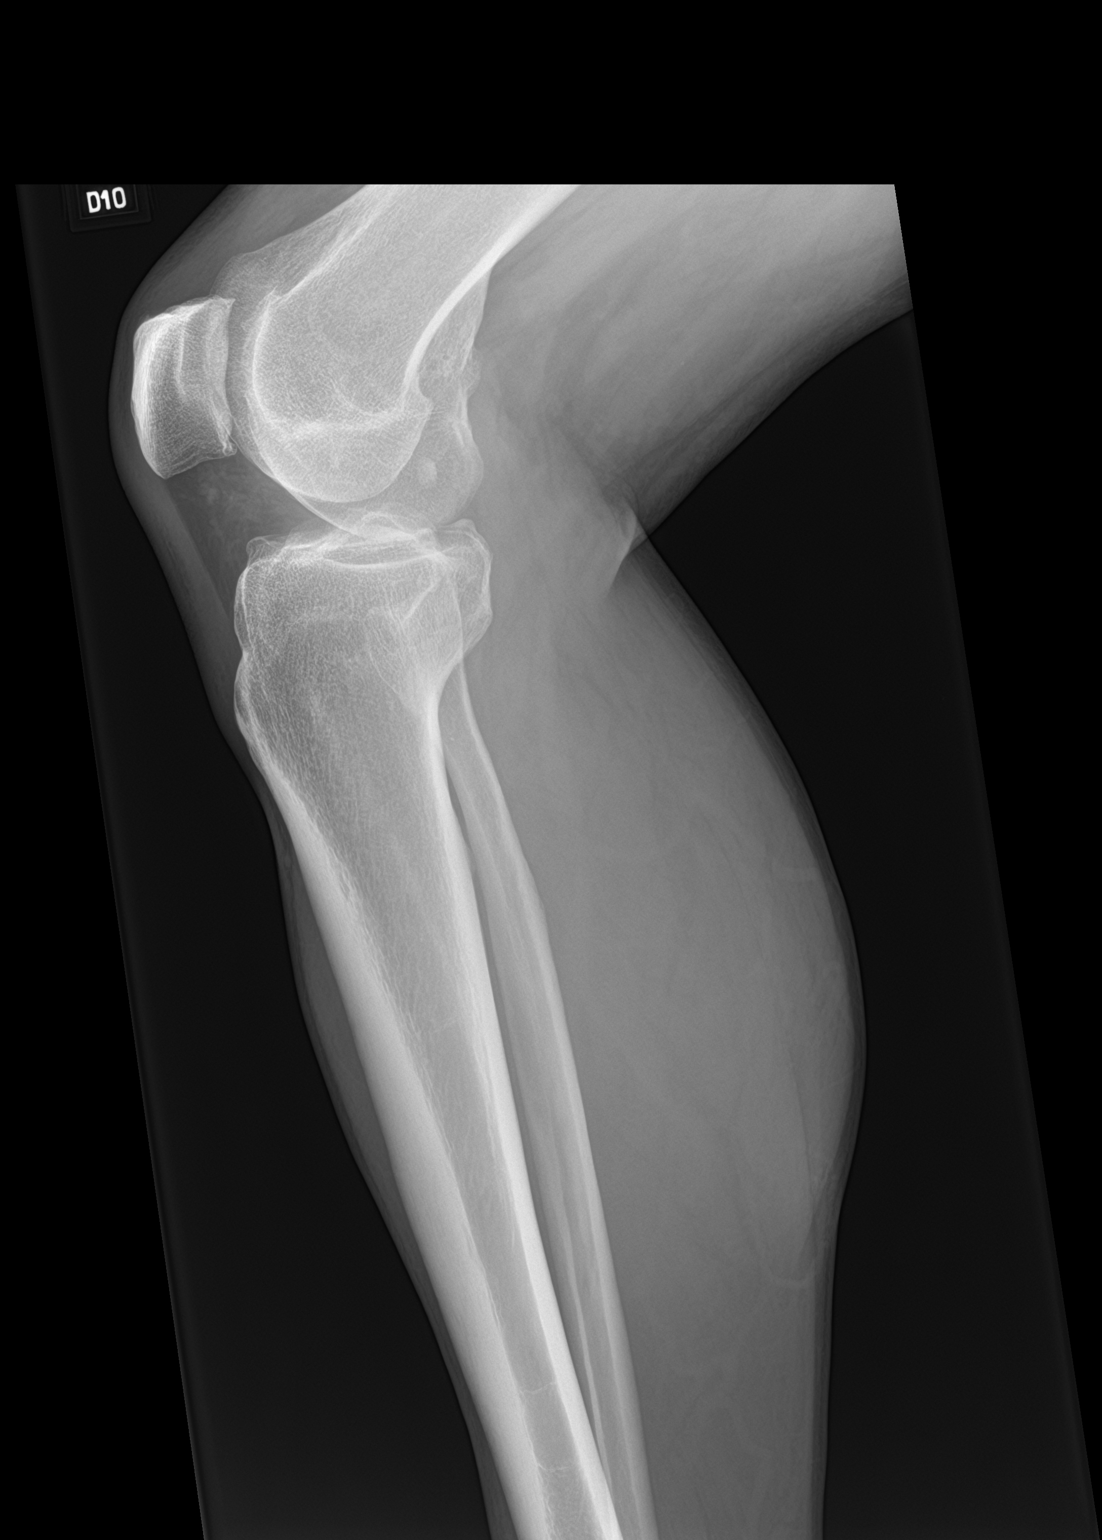

[tibia lat (2 of 2)]
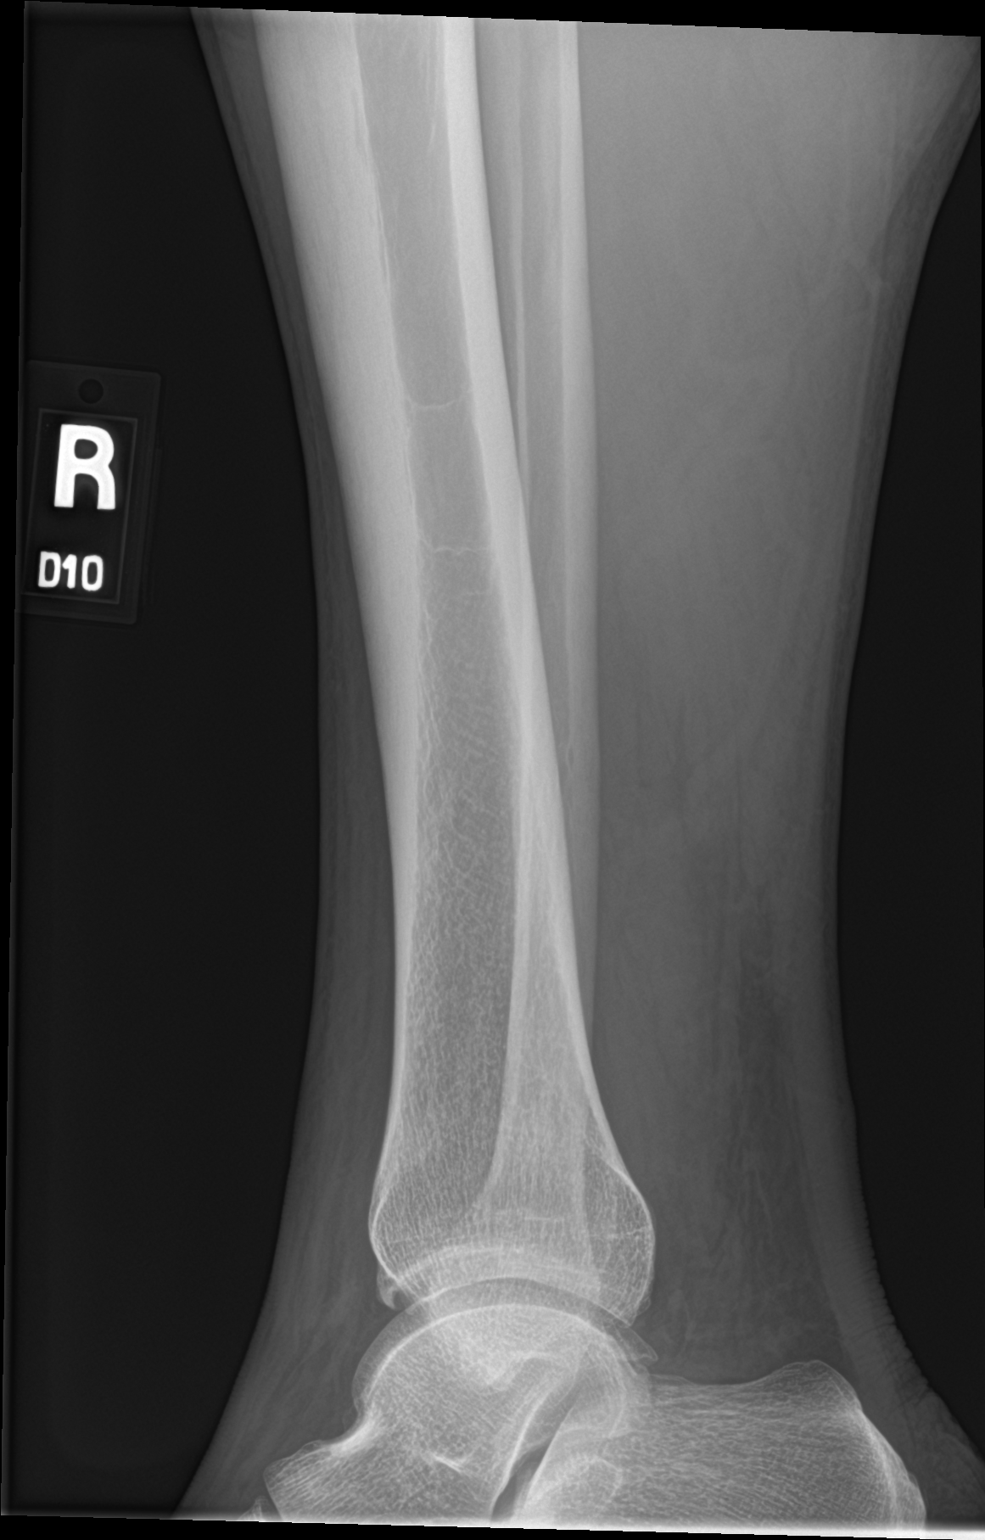

[4 of 4 positions shown; findings below may reference images not displayed]

FINDINGS: Frontal and lateral views were obtained. No fracture or dislocation.
No abnormal periosteal reaction. There is narrowing of the
patellofemoral joint. No knee or ankle joint effusion evident.
IMPRESSION: No fracture or dislocation.  Narrowing patellofemoral joint.

## 2019-08-01 MED ORDER — RIVAROXABAN (XARELTO) VTE STARTER PACK (15 & 20 MG): ORAL_TABLET | ORAL | 0 refills | Status: DC

## 2019-08-01 MED ORDER — HYDROCODONE-ACETAMINOPHEN 5-325 MG PO TABS: 1.0000 | ORAL_TABLET | Freq: Four times a day (QID) | ORAL | 0 refills | Status: DC | PRN

## 2019-08-01 MED ORDER — FENTANYL CITRATE (PF) 100 MCG/2ML IJ SOLN
50.0000 ug | Freq: Once | INTRAMUSCULAR | Status: AC
  Administered 2019-08-01: 50 ug via INTRAVENOUS
  Filled 2019-08-01: qty 2

## 2019-08-01 NOTE — ED Triage Notes (Signed)
Pt reports RLE leg pain x5 days. Pt reports "vein turned red and began to pulsate." pt denies any known injury and reports increased pain with ambulation. Pt denies being on any blood thinners.

## 2019-08-01 NOTE — ED Provider Notes (Signed)
[facility] EMERGENCY DEPARTMENT Provider Note   CSN: [id number] Arrival date & time: 08/01/19  3094     History Chief Complaint  Patient presents with  . Leg Pain    Logan Bush is a 59 y.o. male.  HPI Patient presents with right lower leg pain.  Around a month ago he fell off a ladder and hit the leg.  States he has had some pain in the right medial lower leg and back since then.  States however the last 5 days to become much more severe.  States that vein has "turned red.  Worse with ambulation.  States he cannot walk.  No chest pain or trouble breathing.  No numbness or weakness.  States it is very painful.  Had seen PCP and was given an antifungal.    Past Medical History:  Diagnosis Date  . Allergy   . Vertigo     Patient Active Problem List   Diagnosis Date Noted  . Abdominal pain 10/13/2017  . Change in bowel habits 05/12/2017  . RUQ pain 05/12/2017  . Palpitations 03/02/2017  . Osteoarthritis of left knee 03/02/2017  . Polyp of colon 09/16/2016    Past Surgical History:  Procedure Laterality Date  . COLONOSCOPY N/A 08/05/2017   Procedure: COLONOSCOPY;  Surgeon: Corbin Ade, MD;  Location: [facility] SUITE;  Service: Endoscopy;  Laterality: N/A;  1:45pm-rescheduled to 7/12 @9 :30am per  . POLYPECTOMY  10/2012  . POLYPECTOMY  08/05/2017   Procedure: POLYPECTOMY;  Surgeon: 10/06/2017, MD;  Location: [facility] SUITE;  Service: Endoscopy;;       Family History  Problem Relation Age of Onset  . Heart attack Mother   . Hypertension Mother   . Cancer Father        prostate cancer  . Asthma Father   . Cancer Sister        breast cancer  . Cancer Maternal Grandmother        Breast Cancer  . Colon cancer Neg Hx   . Colon polyps Neg Hx     Social History   Tobacco Use  . Smoking status: Passive Smoke Exposure - Never Smoker  . Smokeless tobacco: Never Used  Vaping Use  . Vaping Use: Never used  Substance Use Topics  . Alcohol use: No  . Drug  use: No    Home Medications Prior to Admission medications   Medication Sig Start Date End Date Taking? Authorizing Provider  HYDROcodone-acetaminophen (NORCO/VICODIN) 5-325 MG tablet Take 1-2 tablets by mouth every 6 (six) hours as needed. 08/01/19   10/02/19, MD  RIVAROXABAN Benjiman Core) VTE STARTER PACK (15 & 20 MG TABLETS) Follow package directions: Take one 15mg  tablet by mouth twice a day. On day 22, switch to one 20mg  tablet once a day. Take with food. 08/01/19 08/01/19  , MD    Allergies    Aspirin  Review of Systems   Review of Systems  Constitutional: Negative for appetite change.  HENT: Negative for congestion.   Respiratory: Negative for shortness of breath.   Cardiovascular: Negative for chest pain.  Gastrointestinal: Negative for abdominal pain.  Genitourinary: Negative for flank pain.  Musculoskeletal: Positive for back pain.       Right lower leg pain.  Skin: Positive for color change.  Neurological: Positive for numbness.  Psychiatric/Behavioral: Negative for confusion.    Physical Exam Updated Vital Signs BP (!) 157/99   Pulse (!) 57   Temp 97.7 F (36.5 C) (  Oral)   Resp 16   Ht 5\' 11"  (1.803 m)   Wt 79.4 kg   SpO2 99%   BMI 24.41 kg/m   Physical Exam Vitals and nursing note reviewed.  HENT:     Head: Normocephalic.  Pulmonary:     Breath sounds: No wheezing or rhonchi.  Abdominal:     Tenderness: There is no abdominal tenderness.  Musculoskeletal:     Comments: Moderate to severe tenderness over right medial lower leg.  Minimal erythema and some small darkened macular areas.  No induration.  Not irritable at ankle.  No fluctuance.  Strong dorsalis pedis pulse.  Also lumbar tenderness without deformity.  Skin:    General: Skin is warm.     Capillary Refill: Capillary refill takes less than 2 seconds.  Neurological:     Mental Status: He is alert and oriented to person, place, and time.     ED Results / Procedures /  Treatments   Labs (all labs ordered are listed, but only abnormal results are displayed) Labs Reviewed - No data to display  EKG None  Radiology DG Lumbar Spine Complete  Result Date: 08/01/2019 CLINICAL DATA:  Low back pain EXAM: LUMBAR SPINE - COMPLETE 4+ VIEW COMPARISON:  None. FINDINGS: Frontal, lateral, spot lumbosacral lateral, and bilateral oblique views were obtained. There are 5 non-rib-bearing lumbar type vertebral bodies. There is no fracture or spondylolisthesis. There is moderate disc space narrowing at L4-5 with slightly milder disc space narrowing at L2-3 and L3-4. There is facet osteoarthritic change at L4-5 and L5-S1 bilaterally. No erosive change evident. IMPRESSION: Osteoarthritic change at several levels. No fracture or spondylolisthesis. Electronically Signed   By: 10/02/2019 III M.D.   On: 08/01/2019 08:53   DG Tibia/Fibula Right  Result Date: 08/01/2019 CLINICAL DATA:  Pain EXAM: RIGHT TIBIA AND FIBULA - 2 VIEW COMPARISON:  None. FINDINGS: Frontal and lateral views were obtained. No fracture or dislocation. No abnormal periosteal reaction. There is narrowing of the patellofemoral joint. No knee or ankle joint effusion evident. IMPRESSION: No fracture or dislocation.  Narrowing patellofemoral joint. Electronically Signed   By: 10/02/2019 III M.D.   On: 08/01/2019 08:47   10/02/2019 Venous Img Lower Unilateral Right  Result Date: 08/01/2019 CLINICAL DATA:  Right lower extremity pain EXAM: RIGHT LOWER EXTREMITY VENOUS DUPLEX ULTRASOUND TECHNIQUE: Gray-scale sonography with graded compression, as well as color Doppler and duplex ultrasound were performed to evaluate the right lower extremity deep venous system from the level of the common femoral vein and including the common femoral, femoral, profunda femoral, popliteal and calf veins including the posterior tibial, peroneal and gastrocnemius veins when visible. The superficial great saphenous vein was also interrogated.  Spectral Doppler was utilized to evaluate flow at rest and with distal augmentation maneuvers in the common femoral, femoral and popliteal veins. COMPARISON:  None. FINDINGS: Contralateral Common Femoral Vein: Respiratory phasicity is normal and symmetric with the symptomatic side. No evidence of thrombus. Normal compressibility. Common Femoral Vein: No evidence of thrombus. Normal compressibility, respiratory phasicity and response to augmentation. Saphenofemoral Junction: No evidence of thrombus. Normal compressibility and flow on color Doppler imaging. Profunda Femoral Vein: No evidence of thrombus. Normal compressibility and flow on color Doppler imaging. Femoral Vein: No evidence of thrombus. Normal compressibility, respiratory phasicity and response to augmentation. Popliteal Vein: No evidence of thrombus. Normal compressibility, respiratory phasicity and response to augmentation. Calf Veins: No evidence of thrombus. Normal compressibility and flow on color Doppler imaging. Superficial Great Saphenous  Vein: Acute appearing thrombus is noted throughout much of the right greater saphenous vein from the knee region medially to the level of the ankle. There is essentially absent flow in this area with loss of compression and Doppler signal. Venous Reflux:  None. Other Findings:  None. IMPRESSION: No evidence of deep venous thrombosis in the right lower extremity. There is extensive greater saphenous vein thrombus medially from the knee region to the ankle, a superficial venous structure. This thrombus does not approach the saphenofemoral junction on the right. Left common femoral vein patent. These results will be called to the ordering clinician or representative by the Radiologist Assistant, and communication documented in the PACS or [employer]. Electronically Signed   By: Bretta Bang III M.D.   On: 08/01/2019 08:46    Procedures Procedures (including critical care time)  Medications Ordered  in ED Medications  fentaNYL (SUBLIMAZE) injection 50 mcg (50 mcg Intravenous Given 08/01/19 0747)    ED Course  I have reviewed the triage vital signs and the nursing notes.  Pertinent labs & imaging results that were available during my care of the patient were reviewed by me and considered in my medical decision making (see chart for details).    MDM Rules/Calculators/A&P                          Patient with pain in right lower leg.  X-rays reassuring does have some mild erythema.  Does have greater saphenous vein thrombosis which potentially could be the cause of the pain.  Initially with the plan was to treat with anticoagulation due to the severity of the pain.  Does not appear to be a DVT however.  However patient has had bleeding both from hemorrhoids and colon previously.  Told not to take aspirin but I think that would likely make him a poor candidate for further anticoagulation.  Will have follow-up with vascular surgery and treat symptomatically at this time. Final Clinical Impression(s) / ED Diagnoses Final diagnoses:  Acute superficial venous thrombosis of lower extremity, right    Rx / DC Orders ED Discharge Orders         Ordered    HYDROcodone-acetaminophen (NORCO/VICODIN) 5-325 MG tablet  Every 6 hours PRN     Discontinue  Reprint     08/01/19 0945    RIVAROXABAN (XARELTO) VTE STARTER PACK (15 & 20 MG TABLETS)  Status:  Discontinued     Reprint     08/01/19 0945           Benjiman Core, MD 08/01/19 1005

## 2019-08-01 NOTE — Discharge Instructions (Addendum)
You have a superficial vein thrombosis.  We initially were going to treat with blood thinners, however it with your bleeding history we now do not think that is a good idea.  Take the pain medicine to help. Warm soaks may also help with the symptoms.   Follow-up with vascular surgery. If the pharmacy fills the prescription for Xarelto do not take it.

## 2019-08-08 ENCOUNTER — Telehealth: Payer: Self-pay

## 2019-08-08 ENCOUNTER — Other Ambulatory Visit: Payer: Self-pay

## 2019-08-08 ENCOUNTER — Ambulatory Visit (INDEPENDENT_AMBULATORY_CARE_PROVIDER_SITE_OTHER): Payer: Self-pay | Admitting: Surgery

## 2019-08-08 ENCOUNTER — Encounter: Payer: Self-pay | Admitting: Surgery

## 2019-08-08 ENCOUNTER — Ambulatory Visit (HOSPITAL_COMMUNITY)
Admission: RE | Admit: 2019-08-08 | Discharge: 2019-08-08 | Disposition: A | Discharge status: Home / Self Care | Payer: Self-pay | Source: Ambulatory Visit | Attending: Surgery | Admitting: Surgery

## 2019-08-08 ENCOUNTER — Other Ambulatory Visit: Payer: Self-pay | Admitting: *Deleted

## 2019-08-08 VITALS — BP 127/86 | HR 72 | Temp 98.2°F | Resp 20 | Ht 71.0 in | Wt 180.3 lb

## 2019-08-08 DIAGNOSIS — M79604 Pain in right leg: Secondary | ICD-10-CM | POA: Insufficient documentation

## 2019-08-08 DIAGNOSIS — I809 Phlebitis and thrombophlebitis of unspecified site: Secondary | ICD-10-CM

## 2019-08-08 NOTE — Progress Notes (Signed)
[facility]  Patient name: Logan Bush MRN: [id number] DOB: Oct 15, 1960 Sex: male   REQUESTING PROVIDER:    ER Jeani Hawking   REASON FOR CONSULT:    Clot in vein  HISTORY OF PRESENT ILLNESS:   Logan Bush is a 59 y.o. male, who fell off a ladder in early June and had some pain on the inside of his right leg since then.  It became more severe on 08/01/2019 which prompted him going to the emergency department.  He states that the vein turned red.  His PCP treated him with an antifungal.  The patient has had bleeding from hemorrhoids and colonoscopy x3 previously and was told not to take aspirin.  Therefore he was not started on anticoagulation.  He was treated symptomatically and told to follow-up with vascular surgery  He continues to have pain to palpation along his right medial thigh.  He does not have any significant swelling. PAST MEDICAL HISTORY    Past Medical History:  Diagnosis Date   Allergy    Vertigo      FAMILY HISTORY   Family History  Problem Relation Age of Onset   Heart attack Mother    Hypertension Mother    Cancer Father        prostate cancer   Asthma Father    Cancer Sister        breast cancer   Cancer Maternal Grandmother        Breast Cancer   Colon cancer Neg Hx    Colon polyps Neg Hx     SOCIAL HISTORY:   Social History   Socioeconomic History   Marital status: Married    Spouse name: Not on file   Number of children: Not on file   Years of education: Not on file   Highest education level: Not on file  Occupational History   Not on file  Tobacco Use   Smoking status: Passive Smoke Exposure - Never Smoker   Smokeless tobacco: Never Used  Vaping Use   Vaping Use: Never used  Substance and Sexual Activity   Alcohol use: No   Drug use: No   Sexual activity: Not on file  Other Topics Concern   Not on file  Social History Narrative   Not on file    Social Determinants of Health   Financial Resource Strain:    Difficulty of Paying Living Expenses:   Food Insecurity:    Worried About Programme researcher, broadcasting/film/video in the Last Year:    Barista in the Last Year:   Transportation Needs:    Freight forwarder (Medical):    Lack of Transportation (Non-Medical):   Physical Activity:    Days of Exercise per Week:    Minutes of Exercise per Session:   Stress:    Feeling of Stress :   Social Connections:    Frequency of Communication with Friends and Family:    Frequency of Social Gatherings with Friends and Family:    Attends Religious Services:    Active Member of Clubs or Organizations:    Attends Banker Meetings:    Marital Status:   Intimate Partner Violence:    Fear of Current or Ex-Partner:    Emotionally Abused:    Physically Abused:    Sexually Abused:     ALLERGIES:    Allergies  Allergen Reactions   Aspirin Other (See Comments)    Makes GI bleeding  CURRENT MEDICATIONS:    Current Outpatient Medications  Medication Sig Dispense Refill   HYDROcodone-acetaminophen (NORCO/VICODIN) 5-325 MG tablet Take 1-2 tablets by mouth every 6 (six) hours as needed. 6 tablet 0   No current facility-administered medications for this visit.    REVIEW OF SYSTEMS:   [X]  denotes positive finding, [ ]  denotes negative finding Cardiac  Comments:  Chest pain or chest pressure:    Shortness of breath upon exertion:    Short of breath when lying flat:    Irregular heart rhythm:        Vascular    Pain in calf, thigh, or hip brought on by ambulation:    Pain in feet at night that wakes you up from your sleep:     Blood clot in your veins:    Leg swelling:         Pulmonary    Oxygen at home:    Productive cough:     Wheezing:         Neurologic    Sudden weakness in arms or legs:     Sudden numbness in arms or legs:     Sudden onset of difficulty speaking or slurred speech:     Temporary loss of vision in one eye:     Problems with dizziness:         Gastrointestinal    Blood in stool:      Vomited blood:         Genitourinary    Burning when urinating:     Blood in urine:        Psychiatric    Major depression:         Hematologic    Bleeding problems:    Problems with blood clotting too easily:        Skin    Rashes or ulcers:        Constitutional    Fever or chills:     PHYSICAL EXAM:   Vitals:   08/08/19 1426  BP: 127/86  Pulse: 72  Resp: 20  Temp: 98.2 F (36.8 C)  SpO2: 97%  Weight: 180 lb 4.8 oz (81.8 kg)  Height: 5\' 11"  (1.803 m)    GENERAL: The patient is a well-nourished male, in no acute distress. The vital signs are documented above. CARDIAC: There is a regular rate and rhythm.  VASCULAR: No significant leg swelling.  Tenderness over the medial right leg PULMONARY: Nonlabored respirations  MUSCULOSKELETAL: There are no major deformities or cyanosis. NEUROLOGIC: No focal weakness or paresthesias are detected. SKIN: There are no ulcers or rashes noted. PSYCHIATRIC: The patient has a normal affect.  STUDIES:   I have reviewed the following: No evidence of deep venous thrombosis in the right lower extremity. There is extensive greater saphenous vein thrombus medially from the knee region to the ankle, a superficial venous structure. This thrombus does not approach the saphenofemoral junction on the right.  Left common femoral vein patent.  ASSESSMENT and PLAN   Right leg thrombophlebitis: The patient has a clot in his right saphenous vein.  I repeated his ultrasound today to monitor progression.  The clot extends up to the mid thigh.  Originally it went up to about the knee.  I have encouraged the patient to take ibuprofen for the inflammation and to use warm compresses.  I am bringing him back in 2 weeks for repeat ultrasound to see if it is propagated further.  If it has not, I do not  think anything else needs to be  done.  If the patient does have propagation into the deep venous system, he would require an IVC filter as he is adamant that he cannot take any form of anticoagulation because of his issues with GI bleeding which have occurred on 3 separate occasions.  His GI doctors have told him not to take aspirin or blood thinner.   Charlena Cross, MD, FACS [facility] [phone] Pager [phone]

## 2019-08-08 NOTE — Telephone Encounter (Signed)
Pt called wanting to know the dosage of Ibuprofen Dr. Myra Gianotti wants him to take.  Per Dr. Myra Gianotti, patient is to take two 200mg  tablets every 8 hrs for only 3 days.  Patient verbalized understanding.  ., LPN

## 2019-08-09 ENCOUNTER — Other Ambulatory Visit: Payer: Self-pay | Admitting: *Deleted

## 2019-08-09 DIAGNOSIS — I809 Phlebitis and thrombophlebitis of unspecified site: Secondary | ICD-10-CM

## 2019-08-09 DIAGNOSIS — M79604 Pain in right leg: Secondary | ICD-10-CM

## 2019-08-22 ENCOUNTER — Other Ambulatory Visit: Payer: Self-pay

## 2019-08-22 ENCOUNTER — Ambulatory Visit (INDEPENDENT_AMBULATORY_CARE_PROVIDER_SITE_OTHER): Payer: Self-pay | Admitting: Physician Assistant

## 2019-08-22 ENCOUNTER — Ambulatory Visit (HOSPITAL_COMMUNITY)
Admission: RE | Admit: 2019-08-22 | Discharge: 2019-08-22 | Disposition: A | Discharge status: Home / Self Care | Payer: Self-pay | Source: Ambulatory Visit | Attending: Vascular Surgery | Admitting: Vascular Surgery

## 2019-08-22 VITALS — BP 128/83 | HR 82 | Temp 97.7°F | Resp 20 | Ht 71.0 in | Wt 180.6 lb

## 2019-08-22 DIAGNOSIS — I809 Phlebitis and thrombophlebitis of unspecified site: Secondary | ICD-10-CM | POA: Insufficient documentation

## 2019-08-22 DIAGNOSIS — M79604 Pain in right leg: Secondary | ICD-10-CM | POA: Insufficient documentation

## 2019-08-22 MED ORDER — APIXABAN 5 MG PO TABS: 5.0000 mg | ORAL_TABLET | Freq: Two times a day (BID) | ORAL | 1 refills | Status: DC

## 2019-08-22 NOTE — Progress Notes (Signed)
POST OPERATIVE OFFICE NOTE    CC:  Right LE pain with minimal edema  HPI:  This is a 59 y.o. male who is here for repeat DVT study on the right LE.  He was seen by Dr. Myra Gianotti.  on 08/08/19.  At that time he had a Right leg thrombophlebitis: The patient has a clot in his right saphenous vein.  I repeated his ultrasound today to monitor progression.  The clot extends up to the mid thigh.  His treatment plan was if it propagates up the leg he will need anticoagulation or IVC filter placed.  The patient was adamat that he did not want to take anticoagulation medication after a history of GI problems in 2019.  Dr. Myra Gianotti had him follow up for repeat DVT study today.  Pt returns today for follow up.    Allergies  Allergen Reactions  . Aspirin Other (See Comments)    Makes GI bleeding    Current Outpatient Medications  Medication Sig Dispense Refill  . acetaminophen (TYLENOL) 650 MG CR tablet Take 650 mg by mouth every 8 (eight) hours as needed for pain.    Marland Kitchen apixaban (ELIQUIS) 5 MG TABS tablet Take 1 tablet (5 mg total) by mouth 2 (two) times daily. 60 tablet 1   No current facility-administered medications for this visit.     ROS:  See HPI  Physical Exam:  +---------+---------------+---------+-----------+----------+---------------  ---+  RIGHT  CompressibilityPhasicitySpontaneityPropertiesThrombus Aging     +---------+---------------+---------+-----------+----------+---------------  ---+  CFV   Full      Yes   Yes                     +---------+---------------+---------+-----------+----------+---------------  ---+  SFJ   Full                                  +---------+---------------+---------+-----------+----------+---------------  ---+  FV Prox Full                                    +---------+---------------+---------+-----------+----------+---------------  ---+  FV Mid  Full                                  +---------+---------------+---------+-----------+----------+---------------  ---+  FV DistalFull                                  +---------+---------------+---------+-----------+----------+---------------  ---+  PFV   Full                                  +---------+---------------+---------+-----------+----------+---------------  ---+  POP   Full      Yes   Yes                     +---------+---------------+---------+-----------+----------+---------------  ---+  PTV   Full                                  +---------+---------------+---------+-----------+----------+---------------  ---+  PERO   Full                                  +---------+---------------+---------+-----------+----------+---------------  ---+  GSV   None                      acute and  subacute  +---------+---------------+---------+-----------+----------+---------------  ---+    Summary:  RIGHT:  - There is no evidence of deep vein thrombosis in the lower extremity.    - Superficial thrombosis extending from the calf up to the saphenofemoral  junction. Thrombus is 1.2 cm from the deep venous system. Thrombus has  propagated since last exam.    Extremities:  Erythema streaks medial right leg from BKP to groin with tenderness to palpation.  Minimal local edema.   Right foot warm and well perfused with intact motor and sensation.  No focal weakness.   Lungs: Non labored breathing, clear to ascultation Heart :  RRR  Assessment/Plan:  This is a 59 y.o. male with superficial thrombophlebitis extending from the calf up to the  saphenofemoral junction. Thrombus is 1.2 cm from the deep venous system. Thrombus has propagated since last exam.   We had a long discussion and I reviewed his medical history including the procedure note from the colonoscopy.  There was no medical contraindication for anticoagulation use.  DR. Fields reviewed the case as well and discussed the benefit verses risk of IVC filter verses oral anticoagulation therapy as both viable treatment approaches.    We will prescribe Eliquis 5 mg BID.  If he has problems or develops bleeding we will stop it and then consider the IVC filter.  He will f/u in 4-6 weeks for repeat DVT study of the right LE.  The patient, his wife and his daughter via phone all agreed to this plan of care.     Mosetta Pigeon PA-C [facility] [phone]  MD in clinic Dr. Darrick Penna

## 2019-08-24 ENCOUNTER — Other Ambulatory Visit: Payer: Self-pay | Admitting: *Deleted

## 2019-08-24 DIAGNOSIS — M79604 Pain in right leg: Secondary | ICD-10-CM

## 2019-08-24 DIAGNOSIS — I809 Phlebitis and thrombophlebitis of unspecified site: Secondary | ICD-10-CM

## 2019-08-28 ENCOUNTER — Other Ambulatory Visit: Payer: Self-pay

## 2019-08-28 ENCOUNTER — Encounter: Payer: Self-pay | Admitting: Cardiology

## 2019-08-28 ENCOUNTER — Ambulatory Visit: Payer: Self-pay | Admitting: Cardiology

## 2019-08-28 VITALS — BP 130/88 | HR 73 | Resp 17 | Ht 71.0 in | Wt 180.0 lb

## 2019-08-28 DIAGNOSIS — R030 Elevated blood-pressure reading, without diagnosis of hypertension: Secondary | ICD-10-CM

## 2019-08-28 DIAGNOSIS — R002 Palpitations: Secondary | ICD-10-CM

## 2019-08-28 DIAGNOSIS — I82890 Acute embolism and thrombosis of other specified veins: Secondary | ICD-10-CM

## 2019-08-28 DIAGNOSIS — R0609 Other forms of dyspnea: Secondary | ICD-10-CM

## 2019-08-28 NOTE — Progress Notes (Signed)
Primary Physician/Referring:  Default, Provider, MD  Patient ID: Logan Bush, male    DOB: 05/13/1960, 59 y.o.   MRN: [id number]  Chief Complaint  Patient presents with  . blood clot    leg  . New Patient (Initial Visit)  . Palpitations  . Shortness of Breath   HPI:    Logan Bush  is a 59 y.o. gentleman with no significant prior cardiovascular history, had an accidental fall about 6 to 8 weeks ago, had swollen right ankle.  3 to 4 weeks later he started noticing severe pain in his right calf area and a hard cordlike structure, on the medial aspect of his legs, was seen in the emergency room on 08/01/2019 and was diagnosed with acute superficial thrombosis of the right lower extremity.  Due to recurrent pain, he was evaluated by vascular surgery and repeat lower extremity venous duplex revealed progression of superficial vein thrombosis close to the deep vein.  He was started on Eliquis.  He made an appointment to see me as this daughter works for me and states that over the past 2 to 3 months he has noticed gradually worsening dyspnea on exertion, episodes where he feels like he needs to wake up in the middle of the night feeling short of breath.  No hemoptysis, no leg edema, no associated chest pain.  About 3 to 4 days ago he also woke up with rapid heartbeat that lasted for about 30 minutes and spontaneously subsided.  Past Medical History:  Diagnosis Date  . Allergy   . Palpitation   . Vertigo    Past Surgical History:  Procedure Laterality Date  . COLONOSCOPY N/A 08/05/2017   Procedure: COLONOSCOPY;  Surgeon: Corbin Ade, MD;  [facility];  Service: Endoscopy;  Laterality: N/A;  1:45pm-rescheduled to 7/12 @9 :30am per  . POLYPECTOMY  10/2012  . POLYPECTOMY  08/05/2017   Procedure: POLYPECTOMY;  Surgeon: 10/06/2017, MD;  Location: [facility];  Service: Endoscopy;;   Family History  Problem Relation Age of Onset  . Heart attack Mother   .  Hypertension Mother   . Cancer Father        prostate cancer  . Asthma Father   . Cancer Sister        breast cancer  . Cancer Maternal Grandmother        Breast Cancer  . Colon cancer Neg Hx   . Colon polyps Neg Hx     Social History   Tobacco Use  . Smoking status: Passive Smoke Exposure - Never Smoker  . Smokeless tobacco: Never Used  Substance Use Topics  . Alcohol use: No   Marital Status: Married  ROS  Review of Systems  Cardiovascular: Positive for dyspnea on exertion and palpitations. Negative for chest pain and leg swelling.  Gastrointestinal: Negative for melena.   Objective  Blood pressure 130/88, pulse 73, resp. rate 17, height 5\' 11"  (1.803 m), weight 180 lb (81.6 kg), SpO2 97 %.  Vitals with BMI 08/28/2019 08/22/2019 08/08/2019  Height 5\' 11"  5\' 11"  5\' 11"   Weight 180 lbs 180 lbs 10 oz 180 lbs 5 oz  BMI 25.12 25.2 25.16  Systolic 130 128 08/24/2019  Diastolic 88 83 86  Pulse 73 82 72     Physical Exam Cardiovascular:     Rate and Rhythm: Normal rate and regular rhythm.     Pulses: Intact distal pulses.     Heart sounds: Normal heart sounds. No murmur  heard.  No gallop.      Comments: No leg edema, no JVD. Right medial aspect of the leg and thigh shows superficial thrombophlebitis. No calf tenderness, no other signs of inflammation. Pulmonary:     Effort: Pulmonary effort is normal.     Breath sounds: Normal breath sounds.  Abdominal:     General: Bowel sounds are normal.     Palpations: Abdomen is soft.    Laboratory examination:   No results for input(s): NA, K, CL, CO2, GLUCOSE, BUN, CREATININE, CALCIUM, GFRNONAA, GFRAA in the last 8760 hours. CrCl cannot be calculated (Patient's most recent lab result is older than the maximum 21 days allowed.).  CMP Latest Ref Rng & Units 07/15/2018 02/04/2018 02/21/2017  Glucose 70 - 99 mg/dL 84 062(I) 948(N)  BUN 6 - 20 mg/dL 18 17 46(E)  Creatinine 0.61 - 1.24 mg/dL 7.03 5.00 9.38  Sodium 135 - 145 mmol/L 139 139 139   Potassium 3.5 - 5.1 mmol/L 3.6 3.6 4.1  Chloride 98 - 111 mmol/L 104 105 106  CO2 22 - 32 mmol/L 28 27 24   Calcium 8.9 - 10.3 mg/dL 8.9 9.2 9.2  Total Protein 6.5 - 8.1 g/dL - 7.8 7.2  Total Bilirubin 0.3 - 1.2 mg/dL - 2.0(H) 1.1  Alkaline Phos 38 - 126 U/L - 61 54  AST 15 - 41 U/L - 21 21  ALT 0 - 44 U/L - 31 24   CBC Latest Ref Rng & Units 07/15/2018 02/04/2018 02/21/2017  WBC 4.0 - 10.5 K/uL 7.9 8.6 -  Hemoglobin 13.0 - 17.0 g/dL 02/23/2017 18.2 99.3  Hematocrit 39 - 52 % 42.3 45.3 43.7  Platelets 150 - 400 K/uL 204 167 -    Lipid Panel No results for input(s): CHOL, TRIG, LDLCALC, VLDL, HDL, CHOLHDL, LDLDIRECT in the last 8760 hours.  HEMOGLOBIN A1C Lab Results  Component Value Date   HGBA1C 5.2 10/14/2015   MPG 103 10/14/2015   TSH No results for input(s): TSH in the last 8760 hours.  Medications and allergies   Allergies  Allergen Reactions  . Aspirin Other (See Comments)    Makes GI bleeding     Current Outpatient Medications  Medication Instructions  . acetaminophen (TYLENOL) 650 mg, Every 8 hours PRN  . apixaban (ELIQUIS) 5 mg, Oral, 2 times daily   Radiology:   No results found.  Cardiac Studies:   Lower Extremity Venous Duplex  08/22/2019 right leg: RIGHT:  - There is no evidence of deep vein thrombosis in the lower extremity.   - Superficial thrombosis extending from the calf up to the saphenofemoral  junction. Thrombus is 1.2 cm from the deep venous system. Thrombus has  propagated since last exam 08/08/2019.  EKG                                                                                                EKG 08/28/2019: Normal sinus rhythm at the rate of 73 bpm, left axis deviation, incomplete right bundle branch block.  Poor R wave progression, probably normal variant.  No evidence of ischemia.  Assessment     ICD-10-CM   1. Superficial vein thrombosis right GSV extending from the mid thigh   I82.890   2. Dyspnea on exertion  R06.00 PCV  ECHOCARDIOGRAM COMPLETE  3. Elevated BP without diagnosis of hypertension  R03.0 Lipid Panel With LDL/HDL Ratio  4. Palpitations  R00.2 EKG 12-Lead     There are no discontinued medications.   Recommendations:   Logan Bush  is a 59 y.o. gentleman with no significant prior cardiovascular history, had an accidental fall about 6 to 8 weeks ago, had swollen right ankle.  3 to 4 weeks later he started noticing severe pain in his right calf area and a hard cordlike structure, on the medial aspect of his legs, was seen in the emergency room on 08/01/2019 and was diagnosed with acute superficial thrombosis of the right lower extremity.  Due to recurrent pain, he was evaluated by vascular surgery and repeat lower extremity venous duplex revealed progression of superficial vein thrombosis close to the deep vein.  He was started on Eliquis. He has had one episode of rapid palpitation that occurred sometime on 08/21/2019 that lasted about 15 to 20 minutes suggestive of PSVT but no recurrence.  Over the past 3 months he has noticed gradually worsening shortness of breath and history also suggestive of PND.  He has no significant cardiovascular risk factors, superficial vein thrombosis was clearly precipitated by trauma, no family history of recurrent DVT or PE.  I will obtain an echocardiogram, will consider doing a routine treadmill stress test may be in 2 to 3 months in view of acute superficial vein thrombosis do not want him to put him on a treadmill presently.  I would like to see him back in 3 months, he is advised to continue Eliquis until seen by me.  Suspect he probably will need Eliquis for 3 months. She does have history of colonic polyps.   Yates Decamp, MD, [facility] 08/28/2019, 3:24 PM Office: [phone]

## 2019-09-10 ENCOUNTER — Other Ambulatory Visit: Payer: Self-pay

## 2019-09-17 ENCOUNTER — Ambulatory Visit: Payer: Self-pay | Admitting: Surgery

## 2019-09-17 ENCOUNTER — Encounter (HOSPITAL_COMMUNITY): Payer: Self-pay

## 2019-09-27 ENCOUNTER — Ambulatory Visit: Payer: Self-pay

## 2019-09-27 ENCOUNTER — Other Ambulatory Visit: Payer: Self-pay

## 2019-09-27 DIAGNOSIS — R0609 Other forms of dyspnea: Secondary | ICD-10-CM

## 2019-10-08 ENCOUNTER — Encounter: Payer: Self-pay | Admitting: Surgery

## 2019-10-08 ENCOUNTER — Other Ambulatory Visit: Payer: Self-pay

## 2019-10-08 ENCOUNTER — Ambulatory Visit (INDEPENDENT_AMBULATORY_CARE_PROVIDER_SITE_OTHER): Payer: Self-pay | Admitting: Surgery

## 2019-10-08 ENCOUNTER — Ambulatory Visit (HOSPITAL_COMMUNITY)
Admission: RE | Admit: 2019-10-08 | Discharge: 2019-10-08 | Disposition: A | Discharge status: Home / Self Care | Payer: Self-pay | Source: Ambulatory Visit | Attending: Surgery | Admitting: Surgery

## 2019-10-08 VITALS — BP 122/82 | HR 72 | Temp 98.2°F | Resp 20 | Ht 71.0 in | Wt 180.0 lb

## 2019-10-08 DIAGNOSIS — M79604 Pain in right leg: Secondary | ICD-10-CM

## 2019-10-08 DIAGNOSIS — I809 Phlebitis and thrombophlebitis of unspecified site: Secondary | ICD-10-CM

## 2019-10-08 NOTE — Progress Notes (Signed)
Vascular and Vein Specialist of [facility]  Patient name: Logan Bush MRN: [id number] DOB: 13-Oct-1960 Sex: male   REASON FOR VISIT:    Follow up  HISOTRY OF PRESENT ILLNESS:   Logan Bush is a 60 y.o. male, who fell off a ladder in early June and had some pain on the inside of his right leg since then.  It became more severe on 08/01/2019 which prompted him going to the emergency department.  He states that the vein turned red.  His PCP treated him with an antifungal.  The patient has had bleeding from hemorrhoids and colonoscopy x3 previously and was told not to take aspirin.  Therefore he was not started on anticoagulation.  He was treated symptomatically and told to follow-up with vascular surgery  On his ultrasound in July there has been progression of the clot within his saphenous vein up to the mid thigh.  He had several surveillance ultrasounds which showed continued progression of the thrombus up to but not involving the saphenofemoral junction.  At this time, the decision was made to start him on Eliquis.  He is back today for follow-up.  He states that he still has some discomfort in his medial leg and groin but that this is better.  He has not had any issues with the Eliquis  PAST MEDICAL HISTORY:   Past Medical History:  Diagnosis Date  . Allergy   . DVT (deep venous thrombosis) (HCC)   . Palpitation   . Vertigo      FAMILY HISTORY:   Family History  Problem Relation Age of Onset  . Heart attack Mother   . Hypertension Mother   . Cancer Father        prostate cancer  . Asthma Father   . Cancer Sister        breast cancer  . Cancer Maternal Grandmother        Breast Cancer  . Colon cancer Neg Hx   . Colon polyps Neg Hx     SOCIAL HISTORY:   Social History   Tobacco Use  . Smoking status: Passive Smoke Exposure - Never Smoker  . Smokeless tobacco: Never Used  Substance Use Topics  . Alcohol use: No     ALLERGIES:    Allergies  Allergen Reactions  . Aspirin Other (See Comments)    Makes GI bleeding     CURRENT MEDICATIONS:   Current Outpatient Medications  Medication Sig Dispense Refill  . apixaban (ELIQUIS) 5 MG TABS tablet Take 1 tablet (5 mg total) by mouth 2 (two) times daily. (Patient taking differently: Take 10 mg by mouth 2 (two) times daily. ) 60 tablet 1  . acetaminophen (TYLENOL) 650 MG CR tablet Take 650 mg by mouth every 8 (eight) hours as needed for pain. (Patient not taking: Reported on 08/28/2019)     No current facility-administered medications for this visit.    REVIEW OF SYSTEMS:   [X]  denotes positive finding, [ ]  denotes negative finding Cardiac  Comments:  Chest pain or chest pressure:    Shortness of breath upon exertion:    Short of breath when lying flat:    Irregular heart rhythm:        Vascular    Pain in calf, thigh, or hip brought on by ambulation:    Pain in feet at night that wakes you up from your sleep:     Blood clot in your veins:    Leg swelling:  Pulmonary    Oxygen at home:    Productive cough:     Wheezing:         Neurologic    Sudden weakness in arms or legs:     Sudden numbness in arms or legs:     Sudden onset of difficulty speaking or slurred speech:    Temporary loss of vision in one eye:     Problems with dizziness:         Gastrointestinal    Blood in stool:     Vomited blood:         Genitourinary    Burning when urinating:     Blood in urine:        Psychiatric    Major depression:         Hematologic    Bleeding problems:    Problems with blood clotting too easily:        Skin    Rashes or ulcers:        Constitutional    Fever or chills:      PHYSICAL EXAM:   Vitals:   10/08/19 1517  BP: 122/82  Pulse: 72  Resp: 20  Temp: 98.2 F (36.8 C)  SpO2: 97%  Weight: 180 lb (81.6 kg)  Height: 5\' 11"  (1.803 m)    GENERAL: The patient is a well-nourished male, in no acute distress. The vital signs are  documented above. CARDIAC: There is a regular rate and rhythm.  PULMONARY: Non-labored respirations MUSCULOSKELETAL: There are no major deformities or cyanosis. NEUROLOGIC: No focal weakness or paresthesias are detected. SKIN: There are no ulcers or rashes noted. PSYCHIATRIC: The patient has a normal affect.  STUDIES:   I have reviewed the following studies:  RIGHT:  - Findings consistent with age indeterminate superficial vein thrombosis  involving the right great saphenous vein. Thrombus is 1.78 cm from the  deep venous system.  - Findings appear essentially unchanged or slightly improved compared to  previous examination.  - There is no evidence of deep vein thrombosis in the lower extremity.  MEDICAL ISSUES:   Superficial thrombophlebitis: Initially the patient was not anticoagulated because of a bleeding concern.  However with serial ultrasounds, his thrombus within the saphenous vein continued to propagate up to near the saphenofemoral junction.  When it got close to this area, he was started on Eliquis.  His ultrasound today shows no further propagation of the thrombus.  I think he needs to be on anticoagulation for a total of 6 months.  He is going to come back and 5 months for repeat ultrasound.  If his findings are unchanged, the Eliquis can be stopped and he can be referred to hematology for a hypercoagulable work-up while he is off Eliquis, to determine whether or not he needs long-term anticoagulation.    , MD, FACS Vascular and Vein Specialists of [facility] [phone] Pager [phone]

## 2019-11-22 ENCOUNTER — Ambulatory Visit: Payer: Self-pay

## 2019-11-29 ENCOUNTER — Ambulatory Visit: Payer: Self-pay | Admitting: Cardiology

## 2019-11-29 ENCOUNTER — Encounter: Payer: Self-pay | Admitting: Cardiology

## 2019-11-29 ENCOUNTER — Other Ambulatory Visit: Payer: Self-pay

## 2019-11-29 VITALS — BP 126/83 | HR 64 | Resp 16 | Ht 71.0 in | Wt 180.0 lb

## 2019-11-29 DIAGNOSIS — R350 Frequency of micturition: Secondary | ICD-10-CM

## 2019-11-29 DIAGNOSIS — R030 Elevated blood-pressure reading, without diagnosis of hypertension: Secondary | ICD-10-CM

## 2019-11-29 DIAGNOSIS — R0600 Dyspnea, unspecified: Secondary | ICD-10-CM

## 2019-11-29 DIAGNOSIS — R29810 Facial weakness: Secondary | ICD-10-CM

## 2019-11-29 DIAGNOSIS — I82890 Acute embolism and thrombosis of other specified veins: Secondary | ICD-10-CM

## 2019-11-29 DIAGNOSIS — R0609 Other forms of dyspnea: Secondary | ICD-10-CM

## 2019-11-29 NOTE — Progress Notes (Signed)
Primary Physician/Referring:  [facility], [facility]  Patient ID: Jonel Weldon, male    DOB: 07/31/1960, 59 y.o.   MRN: [id number]  Chief Complaint  Patient presents with  . Superficial vein thrombosis  . Follow-up    3 month   HPI:    Jourden Gilson  is a 59 y.o. gentleman with no significant prior cardiovascular history, had an accidental fall, was seen in the emergency room on 08/01/2019 and was diagnosed with acute superficial thrombosis of the right lower extremity.  Due to recurrent pain, he was evaluated by vascular surgery and repeat lower extremity venous duplex revealed progression of superficial vein thrombosis close to the deep vein.  He was started on Eliquis.  I had seen him 3 months ago for palpitations, dyspnea on exertion and had also diagnosed him with elevated blood pressure. He now presents for follow-up, tolerating anticoagulation without complications, has not had any further palpitations. No change in dyspnea on exertion.  He states that over the past 4 to 6 weeks he has had episodes of headache in the left side and notices the right side of his face especially the lips to droop and lasts only a minute or 2. States that the headache is intense. Happens at least 2-3 times a week. He also states that he has been having frequency of urination and has to go to the bathroom every 3 hours. No fever or chills.  Past Medical History:  Diagnosis Date  . Allergy   . DVT (deep venous thrombosis) (HCC)   . Palpitation   . Vertigo    Past Surgical History:  Procedure Laterality Date  . COLONOSCOPY N/A 08/05/2017   Procedure: COLONOSCOPY;  Surgeon: Corbin Ade, MD;  [facility];  Service: Endoscopy;  Laterality: N/A;  1:45pm-rescheduled to 7/12 @9 :30am per  . POLYPECTOMY  10/2012  . POLYPECTOMY  08/05/2017   Procedure: POLYPECTOMY;  Surgeon: 10/06/2017, MD;  [facility]: [facility];  Service: Endoscopy;;   Family History  Problem Relation Age  of Onset  . Heart attack Mother   . Hypertension Mother   . Cancer Father        prostate cancer  . Asthma Father   . Cancer Sister        breast cancer  . Cancer Maternal Grandmother        Breast Cancer  . Colon cancer Neg Hx   . Colon polyps Neg Hx     Social History   Tobacco Use  . Smoking status: Passive Smoke Exposure - Never Smoker  . Smokeless tobacco: Never Used  Substance Use Topics  . Alcohol use: No   Marital Status: Married  ROS  Review of Systems  Cardiovascular: Positive for dyspnea on exertion. Negative for chest pain, leg swelling and palpitations.  Gastrointestinal: Negative for melena.  Genitourinary: Positive for frequency.  Neurological: Positive for headaches.   Objective  Blood pressure 126/83, pulse 64, resp. rate 16, height 5\' 11"  (1.803 m), weight 180 lb (81.6 kg), SpO2 97 %.  Vitals with BMI 11/29/2019 10/08/2019 08/28/2019  Height 5\' 11"  5\' 11"  5\' 11"   Weight 180 lbs 180 lbs 180 lbs  BMI 25.12 25.12 25.12  Systolic 126 122 10/10/2019  Diastolic 83 82 88  Pulse 64 72 73     Physical Exam Cardiovascular:     Rate and Rhythm: Normal rate and regular rhythm.     Pulses: Intact distal pulses.     Heart sounds: Normal  heart sounds. No murmur heard.  No gallop.      Comments: No leg edema, no JVD. Right medial aspect of the leg and thigh shows superficial thrombophlebitis. No calf tenderness, no other signs of inflammation. Pulmonary:     Effort: Pulmonary effort is normal.     Breath sounds: Normal breath sounds.  Abdominal:     General: Bowel sounds are normal.     Palpations: Abdomen is soft.    Laboratory examination:   No results for input(s): NA, K, CL, CO2, GLUCOSE, BUN, CREATININE, CALCIUM, GFRNONAA, GFRAA in the last 8760 hours. CrCl cannot be calculated (Patient's most recent lab result is older than the maximum 21 days allowed.).  CMP Latest Ref Rng & Units 07/15/2018 02/04/2018 02/21/2017  Glucose 70 - 99 mg/dL 84 093(A) 355(D)  BUN 6 -  20 mg/dL 18 17 32(K)  Creatinine 0.61 - 1.24 mg/dL 0.25 4.27 0.62  Sodium 135 - 145 mmol/L 139 139 139  Potassium 3.5 - 5.1 mmol/L 3.6 3.6 4.1  Chloride 98 - 111 mmol/L 104 105 106  CO2 22 - 32 mmol/L 28 27 24   Calcium 8.9 - 10.3 mg/dL 8.9 9.2 9.2  Total Protein 6.5 - 8.1 g/dL - 7.8 7.2  Total Bilirubin 0.3 - 1.2 mg/dL - 2.0(H) 1.1  Alkaline Phos 38 - 126 U/L - 61 54  AST 15 - 41 U/L - 21 21  ALT 0 - 44 U/L - 31 24   CBC Latest Ref Rng & Units 07/15/2018 02/04/2018 02/21/2017  WBC 4.0 - 10.5 K/uL 7.9 8.6 -  Hemoglobin 13.0 - 17.0 g/dL 02/23/2017 37.6 28.3  Hematocrit 39 - 52 % 42.3 45.3 43.7  Platelets 150 - 400 K/uL 204 167 -    Lipid Panel No results for input(s): CHOL, TRIG, LDLCALC, VLDL, HDL, CHOLHDL, LDLDIRECT in the last 8760 hours.  HEMOGLOBIN A1C Lab Results  Component Value Date   HGBA1C 5.2 10/14/2015   MPG 103 10/14/2015   TSH No results for input(s): TSH in the last 8760 hours.  Medications and allergies   Allergies  Allergen Reactions  . Aspirin Other (See Comments)    Makes GI bleeding     Current Outpatient Medications  Medication Instructions  . acetaminophen (TYLENOL) 650 mg, Every 8 hours PRN  . apixaban (ELIQUIS) 5 mg, Oral, 2 times daily   Radiology:   No results found.  Cardiac Studies:   Lower Extremity Venous Duplex  08/22/2019 right leg: RIGHT:  - There is no evidence of deep vein thrombosis in the lower extremity.   - Superficial thrombosis extending from the calf up to the saphenofemoral  junction. Thrombus is 1.2 cm from the deep venous system. Thrombus has  propagated since last exam 08/08/2019.  Lower Extremity Venous Duplex  10/08/2019:   RIGHT: - Findings consistent with age indeterminate superficial vein thrombosis involving the right great saphenous vein. Thrombus is 1.78 cm from the deep venous system. - Findings appear essentially unchanged or slightly improved compared to previous examination. - There is no evidence of deep  vein thrombosis in the lower extremity.  Echocardiogram 09/27/2019: Normal LV systolic function with visual EF 55-60%. Left ventricle cavity is normal in size. Normal global wall motion. Indeterminate diastolic filling pattern, normal LAP. Calculated EF 58%. Left atrial cavity is moderately dilated. Mild (Grade I) mitral regurgitation.  Mild tricuspid regurgitation. No evidence of pulmonary hypertension. Mild pulmonic regurgitation. No prior study for comparison.  EKG  EKG 08/28/2019: Normal sinus rhythm at the rate of 73 bpm, left axis deviation, incomplete right bundle branch block.  Poor R wave progression, probably normal variant.  No evidence of ischemia.      Assessment     ICD-10-CM   1. Facial droop  R29.810 Ambulatory referral to Neurology  2. Superficial vein thrombosis right GSV extending from the mid thigh   I82.890   3. Elevated BP without diagnosis of hypertension  R03.0   4. Dyspnea on exertion  R06.00   5. Urinary frequency  R35.0 Urinalysis, Routine w reflex microscopic    Urine Culture    There are no discontinued medications.   Recommendations:   Waco Foerster  is a 59 y.o. gentleman with no significant prior cardiovascular history, seen in the emergency room on 08/01/2019 and was diagnosed with acute superficial thrombosis of the right lower extremity.  Due to recurrent pain, he was evaluated by vascular surgery and repeat lower extremity venous duplex revealed progression of superficial vein thrombosis close to the deep vein.  He was started on Eliquis.  He is presently still being followed by vascular surgery Dr. Myra Gianotti.  I had seen him 2 months ago, due to dyspnea ordered an echocardiogram which I reviewed, essentially normal.  He had complained of palpitations suggestive of SVT but he has not had any recurrence.  Patient now complains of frequency of urination.  In July  2020 he had E. coli UTI.  I am suspecting he may have recurrence of UTI.  He follows up with [facility] department, I have entered this as his PCP, will obtain UA and urine cultures. With regard to his headaches and associated right lip drooping that has been occurring over the past 1 month to 6 weeks and last only a few minutes, not sure about the etiology. But patient's symptoms are intense and hence referral to neurology made today.  He does have history of colonic polyps history.  He is presently on Eliquis, tolerating without bleeding.  I advised him to continue the same for now until superficial vein thrombosis completely resolved.    For his dyspnea on exertion, I reviewed his echocardiogram essentially normal, I would like to perform a treadmill stress test but would like to wait till his DVT resolves. Again noticed that his blood pressure slightly elevated, will continue to trend his and have a low threshold to start him on antihypertensive medications if blood pressure remains elevated on his visit in 3 months.   Yates Decamp, MD, [facility] 11/29/2019, 6:23 PM Office: [phone]

## 2019-11-30 ENCOUNTER — Encounter: Payer: Self-pay | Admitting: Neurology

## 2019-12-17 ENCOUNTER — Other Ambulatory Visit: Payer: Self-pay

## 2019-12-17 ENCOUNTER — Encounter: Payer: Self-pay | Admitting: Neurology

## 2019-12-17 ENCOUNTER — Ambulatory Visit (INDEPENDENT_AMBULATORY_CARE_PROVIDER_SITE_OTHER): Payer: Self-pay | Admitting: Neurology

## 2019-12-17 VITALS — BP 129/81 | HR 79 | Ht 66.0 in | Wt 181.4 lb

## 2019-12-17 DIAGNOSIS — R200 Anesthesia of skin: Secondary | ICD-10-CM

## 2019-12-17 DIAGNOSIS — R519 Headache, unspecified: Secondary | ICD-10-CM

## 2019-12-17 DIAGNOSIS — R29810 Facial weakness: Secondary | ICD-10-CM

## 2019-12-17 DIAGNOSIS — G4485 Primary stabbing headache: Secondary | ICD-10-CM

## 2019-12-17 DIAGNOSIS — I8290 Acute embolism and thrombosis of unspecified vein: Secondary | ICD-10-CM

## 2019-12-17 NOTE — Progress Notes (Signed)
NEUROLOGY CONSULTATION NOTE  Dublin Grayer MRN: [id number] DOB: Nov 04, 1960  Referring provider: Yates Decamp, MD Primary care provider: [facility]  Reason for consult:  Headache, facial droop   Subjective:  Logan Bush is a 59 year old right-handed male with history of palpitations and superficial venous thrombosis who presents for headache with facial droop.  History supplemented by referring provider's note.  He was diagnosed with acute superficial thrombosis of the right lower extremity in July and has since been on Eliquis.  A month ago, he started having severe left parietal/retro-orbital paroxysmal stabbing pain associated with flushed face and left conjunctival injection and severe itching of the left eye but no visual disturbance, speech disturbance, nausea, vomiting, photophobia, phonophobia, ptosis or nasal congestion.  Since onset of headaches, he has noted that the the right side of his mouth droops a little bit. It lasts about 3 minutes.  It occurs three or four times a day, usually in the morning, in the evening while watching TV and in the middle of the night (12 to 2 AM).  The treats with Tylenol or naproxen.  He has never had similar headaches in the past.  He reports remote history of headaches once in awhile.  He has history of epilepsy for which he was on medication between 84 and 55 years old.  Mother had headaches.  He did have a CT head performed on 05/31/2019 following a head injury, which was personally reviewed and was normal.  08/28/2019 EKG:  Normal sinus rhythm with rate of 73 bpm 09/27/2019 ECHOCARDIOGRAM:  EF 55-60% 10/08/2019 LOWER EXTREMITY VENOUS DUPLEX:  Age indeterminate superficial vein thrombosis involving the right great saphenous vein.  Thrombus is 1.78 cm from the deep venous system.  Findings appear essentially unchanged or slightly improved from prior exam in July.  No DVT.     PAST MEDICAL HISTORY: Past Medical History:    Diagnosis Date  . Allergy   . DVT (deep venous thrombosis) (HCC)   . Palpitation   . Vertigo     PAST SURGICAL HISTORY: Past Surgical History:  Procedure Laterality Date  . COLONOSCOPY N/A 08/05/2017   Procedure: COLONOSCOPY;  Surgeon: Corbin Ade, MD;  Location: [facility];  Service: Endoscopy;  Laterality: N/A;  1:45pm-rescheduled to 7/12 @9 :30am per  . POLYPECTOMY  10/2012  . POLYPECTOMY  08/05/2017   Procedure: POLYPECTOMY;  Surgeon: 10/06/2017, MD;  Location: [facility];  Service: Endoscopy;;    MEDICATIONS: Current Outpatient Medications on File Prior to Visit  Medication Sig Dispense Refill  . acetaminophen (TYLENOL) 650 MG CR tablet Take 650 mg by mouth every 8 (eight) hours as needed for pain. (Patient not taking: Reported on 08/28/2019)    . apixaban (ELIQUIS) 5 MG TABS tablet Take 1 tablet (5 mg total) by mouth 2 (two) times daily. (Patient taking differently: Take 10 mg by mouth 2 (two) times daily. ) 60 tablet 1  . [DISCONTINUED] RIVAROXABAN (XARELTO) VTE STARTER PACK (15 & 20 MG TABLETS) Follow package directions: Take one 15mg  tablet by mouth twice a day. On day 22, switch to one 20mg  tablet once a day. Take with food. 51 each 0   No current facility-administered medications on file prior to visit.    ALLERGIES: Allergies  Allergen Reactions  . Aspirin Other (See Comments)    Makes GI bleeding    FAMILY HISTORY: Family History  Problem Relation Age of Onset  . Heart attack Mother   .  Hypertension Mother   . Cancer Father        prostate cancer  . Asthma Father   . Cancer Sister        breast cancer  . Cancer Maternal Grandmother        Breast Cancer  . Colon cancer Neg Hx   . Colon polyps Neg Hx     SOCIAL HISTORY: Social History   Socioeconomic History  . Marital status: Married    Spouse name: Not on file  . Number of children: 3  . Years of education: Not on file  . Highest education level: Not on file  Occupational  History  . Not on file  Tobacco Use  . Smoking status: Passive Smoke Exposure - Never Smoker  . Smokeless tobacco: Never Used  Vaping Use  . Vaping Use: Never used  Substance and Sexual Activity  . Alcohol use: No  . Drug use: No  . Sexual activity: Not on file  Other Topics Concern  . Not on file  Social History Narrative  . Not on file   Social Determinants of Health   Financial Resource Strain:   . Difficulty of Paying Living Expenses: Not on file  Food Insecurity:   . Worried About Programme researcher, broadcasting/film/video in the Last Year: Not on file  . Ran Out of Food in the Last Year: Not on file  Transportation Needs:   . Lack of Transportation (Medical): Not on file  . Lack of Transportation (Non-Medical): Not on file  Physical Activity:   . Days of Exercise per Week: Not on file  . Minutes of Exercise per Session: Not on file  Stress:   . Feeling of Stress : Not on file  Social Connections:   . Frequency of Communication with Friends and Family: Not on file  . Frequency of Social Gatherings with Friends and Family: Not on file  . Attends Religious Services: Not on file  . Active Member of Clubs or Organizations: Not on file  . Attends Banker Meetings: Not on file  . Marital Status: Not on file  Intimate Partner Violence:   . Fear of Current or Ex-Partner: Not on file  . Emotionally Abused: Not on file  . Physically Abused: Not on file  . Sexually Abused: Not on file    Objective:  Blood pressure 129/81, pulse 79, height 5\' 6"  (1.676 m), weight 181 lb 6.4 oz (82.3 kg), SpO2 96 %. General: No acute distress.  Patient appears well-groomed.   Head:  Normocephalic/atraumatic Eyes:  fundi examined but not visualized Neck: supple, no paraspinal tenderness, full range of motion Back: No paraspinal tenderness Heart: regular rate and rhythm Lungs: Clear to auscultation bilaterally. Vascular: No carotid bruits. Neurological Exam: Mental status: alert and oriented to  person, place, and time, recent and remote memory intact, fund of knowledge intact, attention and concentration intact, speech fluent and not dysarthric, language intact. Cranial nerves: CN I: not tested CN II: pupils equal, round and reactive to light, visual fields intact CN III, IV, VI:  full range of motion, no nystagmus, no ptosis CN V: facial sensation intact. CN VII: slight right sided lower facial droop. CN VIII: hearing intact CN IX, X: gag intact, uvula midline CN XI: sternocleidomastoid and trapezius muscles intact CN XII: tongue midline Bulk & Tone: normal, no fasciculations. Motor:  muscle strength 5/5 throughout Sensation:  Pinprick sensation reduced in left upper and lower extremities, reduced vibratory sensation in right foot Deep Tendon  Reflexes:  2+ throughout,  toes downgoing.   Finger to nose testing:  Without dysmetria.   Heel to shin:  Without dysmetria.   Gait:  Normal station and stride.  Romberg negative.  Assessment/Plan:   1.  New onset left sided paroxysmal stabbing headaches.  May be cluster headache but given associated left sided extremity numbness and vague right sided lower facial weakness, must rule out other potential etiologies including mass lesion, aneurysm or venous thrombosis (given current thrombophlebitis of right lower extremity).  1.  Check MRI/MRA/MRV of head.  Further recommendations pending results. 2.  He defers medication management of headaches for now until after imaging.  Would consider verapamil. 3.  Limit use of pain relievers to no more than 2 days out of week to prevent risk of rebound or medication-overuse headache. 4.  Follow up after testing.  Thank you for allowing me to take part in the care of this patient.  Shon Millet, DO  CC:   Yates Decamp, MD  [facility]

## 2019-12-17 NOTE — Patient Instructions (Addendum)
1.  We will check MRI/MRA/MRV of head. We have sent a referral to [facility] for your MRI and they will call you directly to schedule your appointment. They are located at [address] [facility]. If you need to contact them directly please call [phone].  2.  Further recommendations pending results. 3.  Limit use of pain relievers to no more than 2 days out of week to prevent risk of rebound or medication-overuse headache. 4.  Follow up next available

## 2020-01-05 ENCOUNTER — Ambulatory Visit
Admission: RE | Admit: 2020-01-05 | Discharge: 2020-01-05 | Disposition: A | Discharge status: Home / Self Care | Payer: Self-pay | Source: Ambulatory Visit | Attending: Neurology | Admitting: Neurology

## 2020-01-05 ENCOUNTER — Other Ambulatory Visit: Payer: Self-pay

## 2020-01-05 DIAGNOSIS — G4485 Primary stabbing headache: Secondary | ICD-10-CM

## 2020-01-05 DIAGNOSIS — R519 Headache, unspecified: Secondary | ICD-10-CM

## 2020-01-05 DIAGNOSIS — I8290 Acute embolism and thrombosis of unspecified vein: Secondary | ICD-10-CM

## 2020-01-05 DIAGNOSIS — R29810 Facial weakness: Secondary | ICD-10-CM

## 2020-01-05 DIAGNOSIS — R200 Anesthesia of skin: Secondary | ICD-10-CM

## 2020-01-05 IMAGING — MR MR MRA HEAD W/O CM
1 series · 23 of 48 positions shown · non-contrast
Comparison: Prior head CT from [DATE].

CLINICAL DATA: Initial evaluation for TIA, worsening headache with
right-sided facial droop. History of venous thrombosis of lower
extremity.

EXAM:
MRA HEAD WITHOUT CONTRAST
TECHNIQUE: Angiographic images of the Circle of Willis were obtained using MRA
technique without intravenous contrast.

[Series 3: tof_3d_multi-slab · axial · 0.7mm · 0.35mm/px · z∈[-72,+21]mm · 23 of 143 slices shown]
[im 1/143]
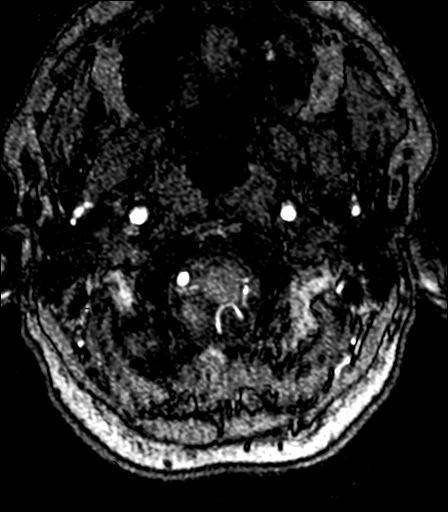
[im 4/143]
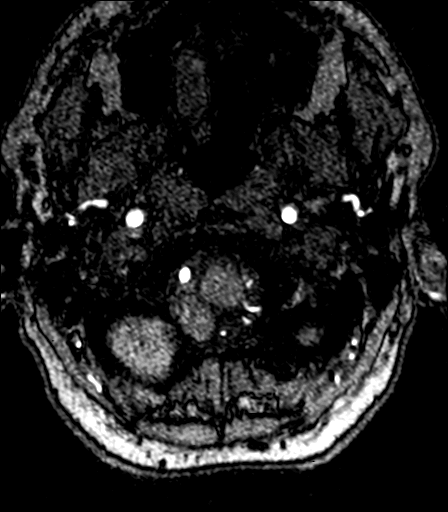
[im 7/143]
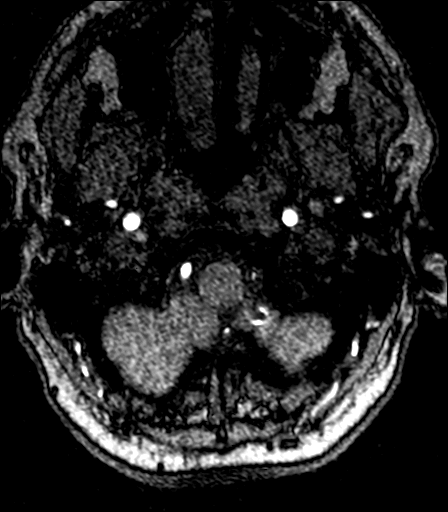
[im 10/143]
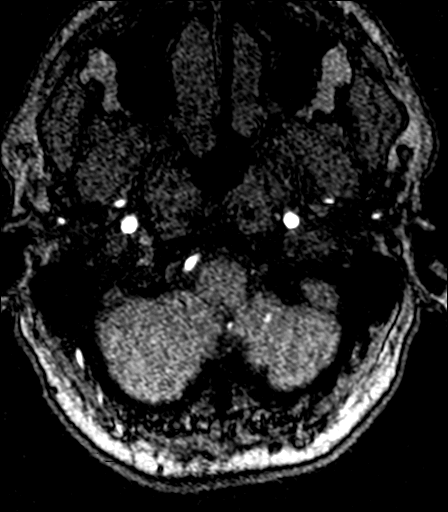
[im 13/143]
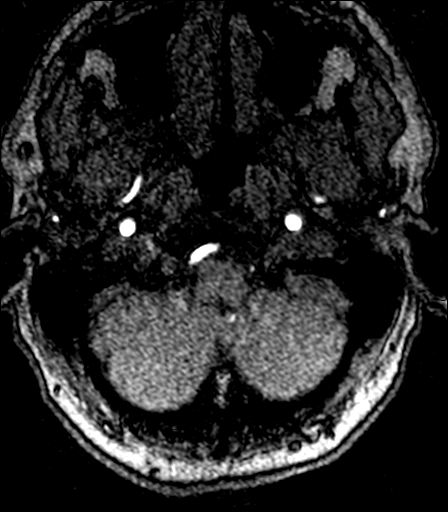
[im 16/143]
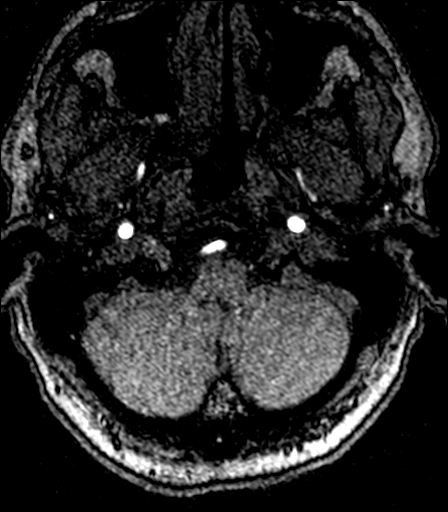
[im 19/143]
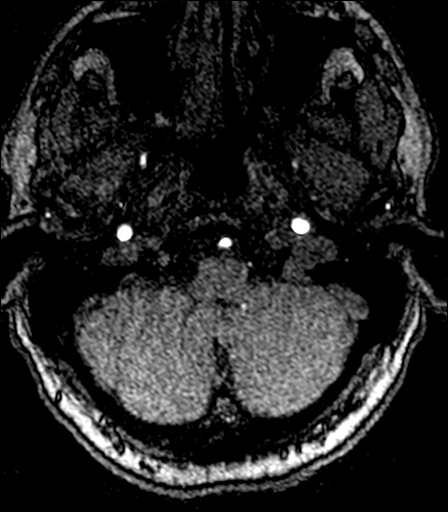
[im 22/143]
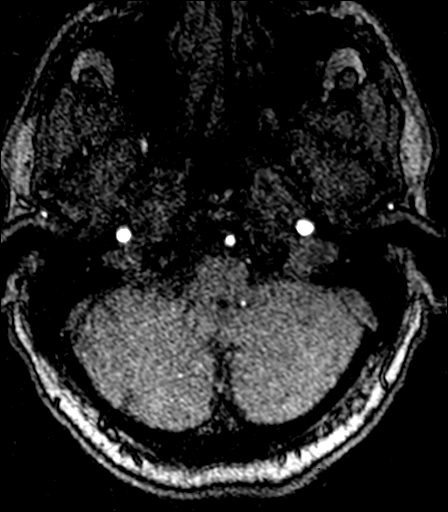
[im 25/143]
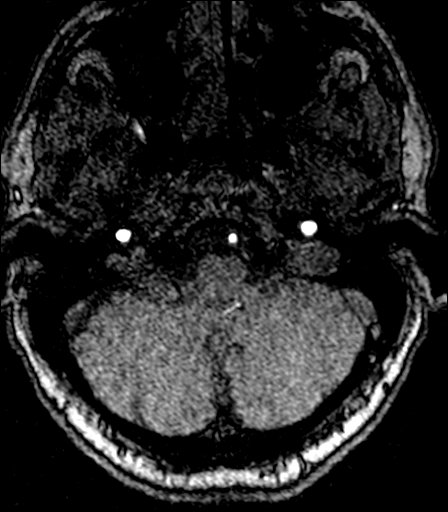
[im 28/143]
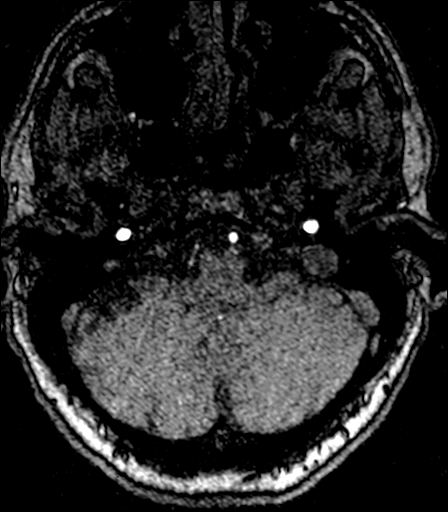
[im 31/143]
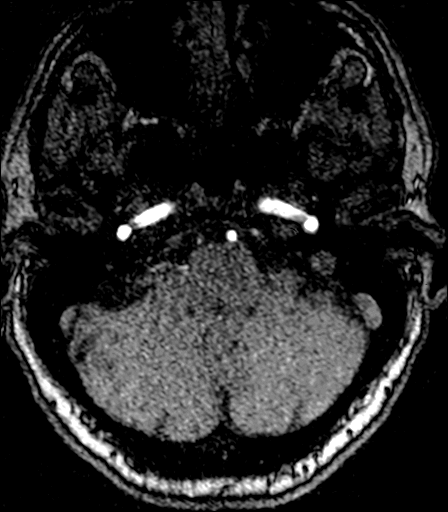
[im 34/143]
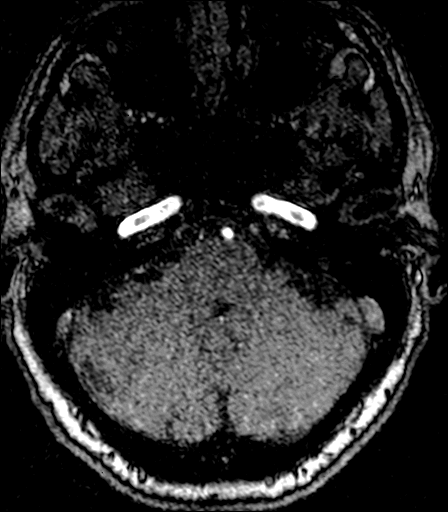
[im 37/143]
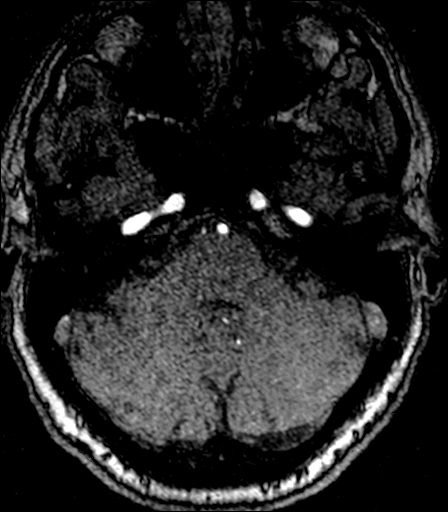
[im 40/143]
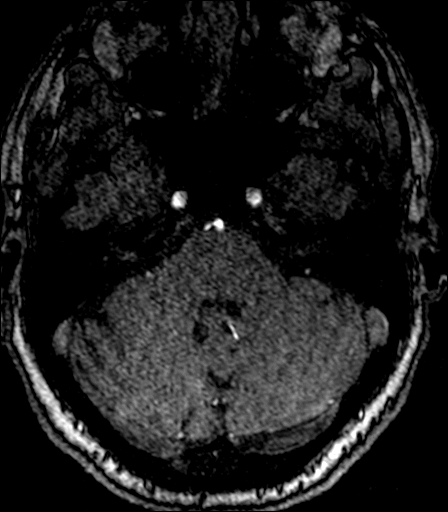
[im 43/143]
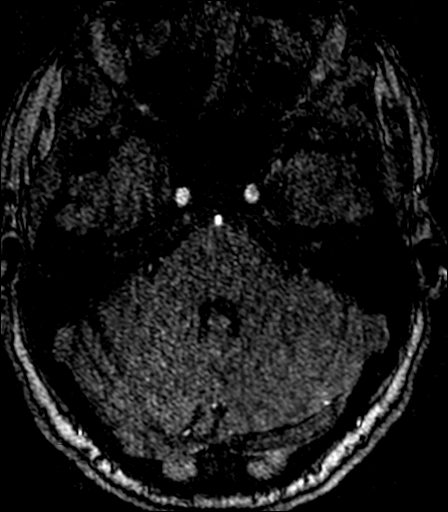
[im 46/143]
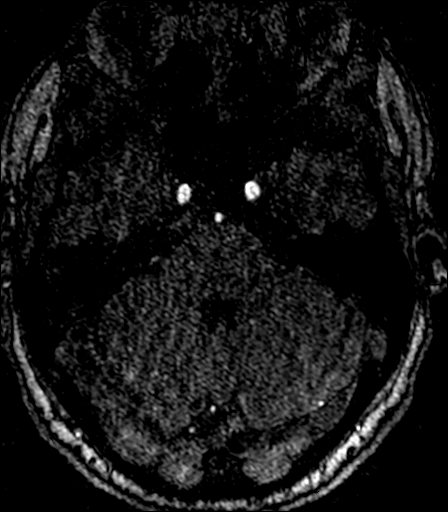
[im 64/143]
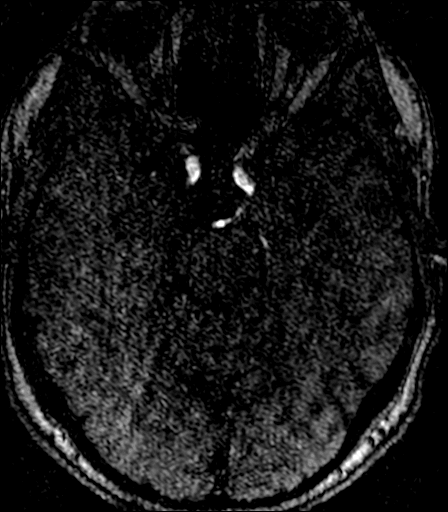
[im 73/143]
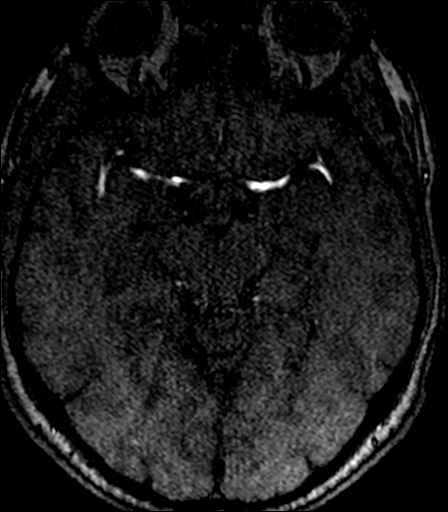
[im 82/143]
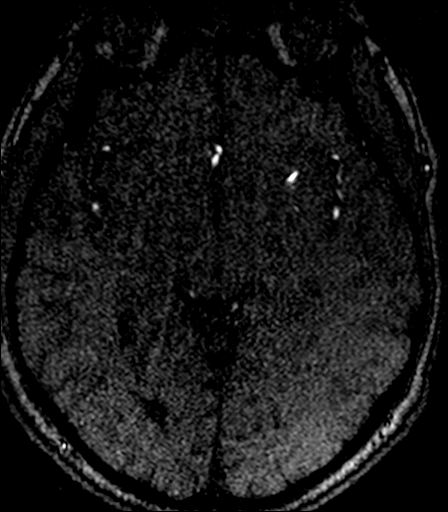
[im 100/143]
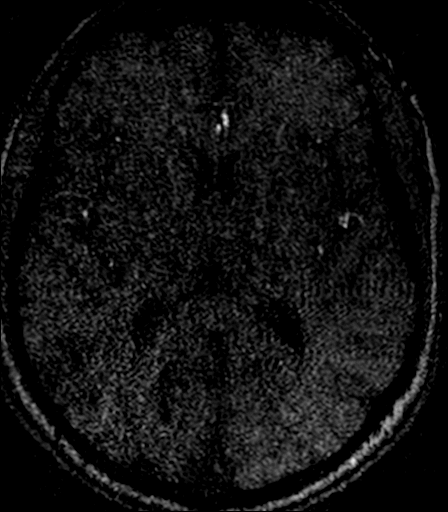
[im 118/143]
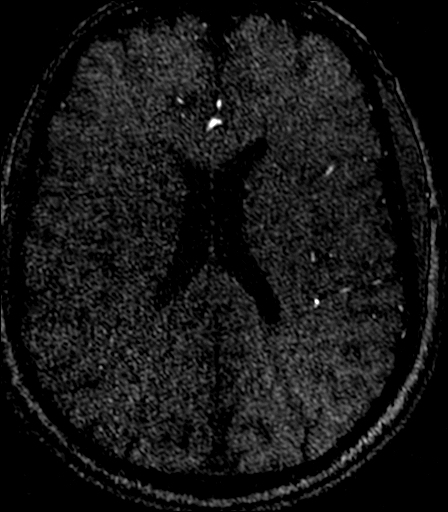
[im 121/143]
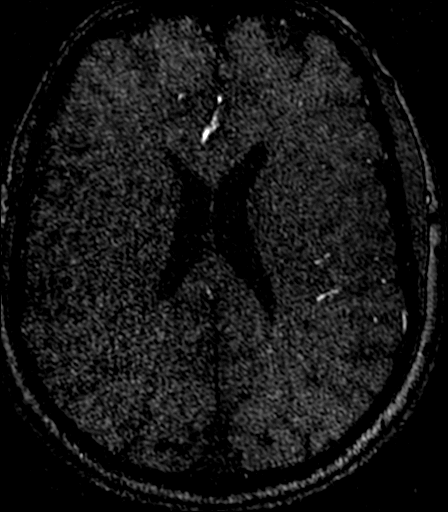
[im 136/143]
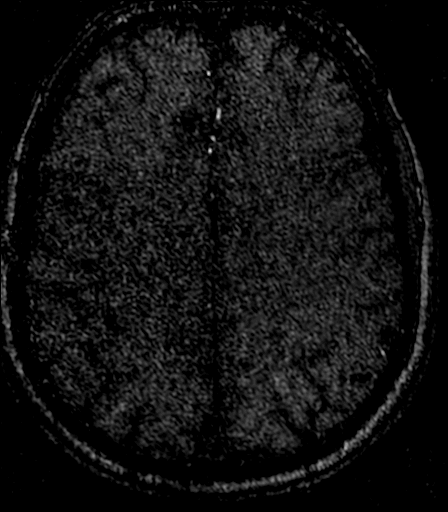

[23 of 48 positions shown; findings below may reference images not displayed]

FINDINGS: ANTERIOR CIRCULATION:

Visualized distal cervical segments of the internal carotid arteries
are widely patent with symmetric antegrade flow. Petrous, cavernous,
and supraclinoid segments widely patent without stenosis or other
abnormality. A1 segments widely patent. Normal anterior
communicating artery complex. Anterior cerebral arteries widely
patent to their distal aspects without stenosis. No M1 stenosis or
occlusion. Normal MCA bifurcations. Distal MCA branches well
perfused and symmetric.

POSTERIOR CIRCULATION:

Right V4 segment strongly dominant and widely patent to the
vertebrobasilar junction. Diffusely hypoplastic left vertebral
artery patent as well. Patent and normal origin of the left PICA.
Right PICA origin not visualized. Basilar widely patent to its
distal aspect without stenosis. Superior cerebral arteries patent
bilaterally. PCA supplied via the basilar as well as small bilateral
posterior communicating arteries. Both PCAs well perfused to their
distal aspects without stenosis.

No intracranial aneurysm or other vascular abnormality.
IMPRESSION: Normal intracranial MRA.

## 2020-01-07 ENCOUNTER — Other Ambulatory Visit: Payer: Self-pay

## 2020-01-07 ENCOUNTER — Ambulatory Visit
Admission: RE | Admit: 2020-01-07 | Discharge: 2020-01-07 | Disposition: A | Discharge status: Home / Self Care | Payer: No Typology Code available for payment source | Source: Ambulatory Visit | Attending: Neurology | Admitting: Neurology

## 2020-01-07 ENCOUNTER — Ambulatory Visit
Admission: RE | Admit: 2020-01-07 | Discharge: 2020-01-07 | Disposition: A | Discharge status: Home / Self Care | Payer: Self-pay | Source: Ambulatory Visit | Attending: Neurology | Admitting: Neurology

## 2020-01-07 DIAGNOSIS — R200 Anesthesia of skin: Secondary | ICD-10-CM

## 2020-01-07 DIAGNOSIS — R519 Headache, unspecified: Secondary | ICD-10-CM

## 2020-01-07 DIAGNOSIS — R29810 Facial weakness: Secondary | ICD-10-CM

## 2020-01-07 DIAGNOSIS — I8290 Acute embolism and thrombosis of unspecified vein: Secondary | ICD-10-CM

## 2020-01-07 DIAGNOSIS — G4485 Primary stabbing headache: Secondary | ICD-10-CM

## 2020-01-07 IMAGING — MR MR HEAD W/O CM
10 series · 48 of 48 positions shown · non-contrast
Comparison: MRA head [DATE].  CT head [DATE].

CLINICAL DATA: New onset of headaches after age 51. Stabbing
headache. Facial droop. Left-sided numbness. Venous thrombosis of
lower extremity. Headache, new or worsening. Additional history
provided by scanning [HOSPITAL] patient reports severe
left-sided headaches with right-sided facial droop for several
weeks, worse at night. Confusion.

EXAM:
MRI HEAD WITHOUT CONTRAST
MRV HEAD WITHOUT CONTRAST
TECHNIQUE: Multiplanar, multiecho pulse sequences of the brain and surrounding
structures were obtained without intravenous contrast. Angiographic
images of the intracranial venous structures were obtained using MRV
technique without intravenous contrast.

[Series 2: T1 · sagittal · 5.0mm · 0.45mm/px · 2 of 23 slices shown]
[im 1/23]
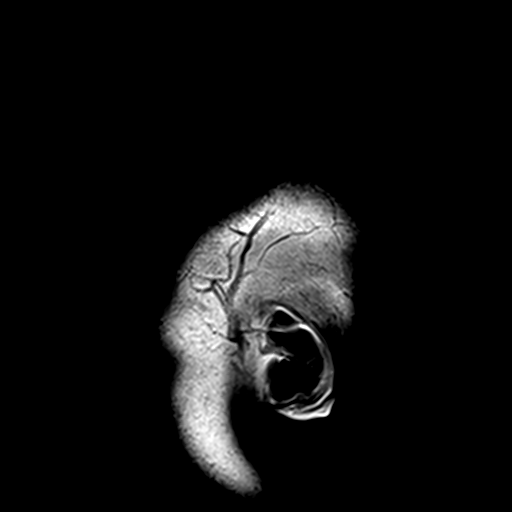
[im 23/23]
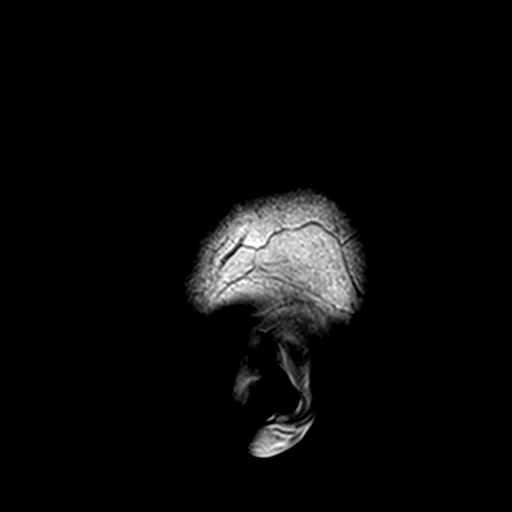

[Series 3: DWI · axial · 3.0mm · 1.80mm/px · z∈[-37,+116]mm · 9 of 106 slices shown (1 of 4)]
[im 1/106]
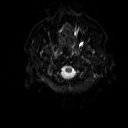
[im 14/106]
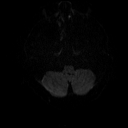
[im 27/106]
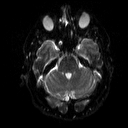
[im 40/106]
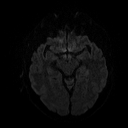
[im 53/106]
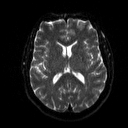
[im 66/106]
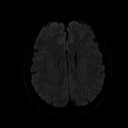
[im 79/106]
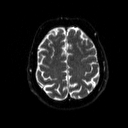
[im 92/106]
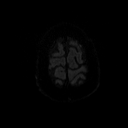
[im 106/106]
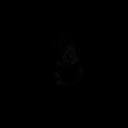

[Series 4: DWI · axial · 3.0mm · 1.80mm/px · z∈[-37,+116]mm · 4 of 49 slices shown (2 of 4)]
[im 1/49]
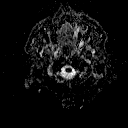
[im 17/49]
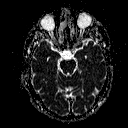
[im 33/49]
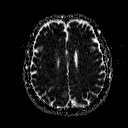
[im 49/49]
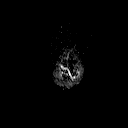

[Series 5: DWI · coronal · 5.0mm · 1.80mm/px · 7 of 76 slices shown (3 of 4)]
[im 1/76]
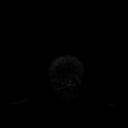
[im 13/76]
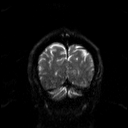
[im 26/76]
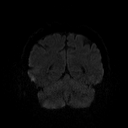
[im 38/76]
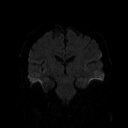
[im 51/76]
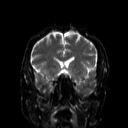
[im 63/76]
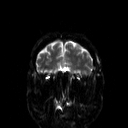
[im 76/76]
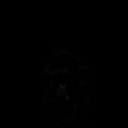

[Series 6: DWI · coronal · 5.0mm · 1.80mm/px · 3 of 38 slices shown (4 of 4)]
[im 1/38]
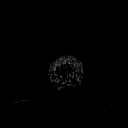
[im 19/38]
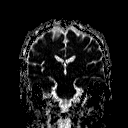
[im 38/38]
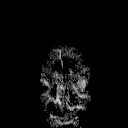

[Series 7: T2 · axial · 5.0mm · 0.60mm/px · z∈[-41,+117]mm · 2 of 24 slices shown (1 of 2)]
[im 1/24]
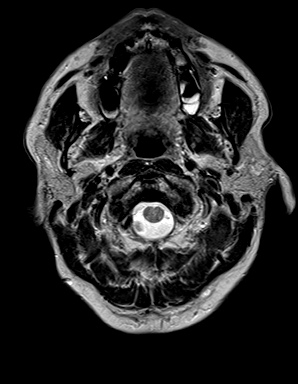
[im 24/24]
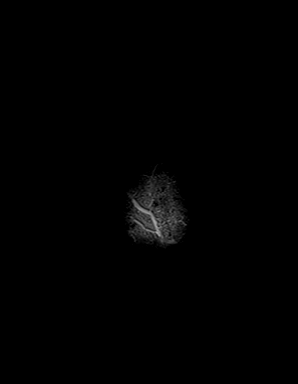

[Series 8: FLAIR · axial · 3.0mm · 0.45mm/px · z∈[-28,+104]mm · 3 of 30 slices shown]
[im 1/30]
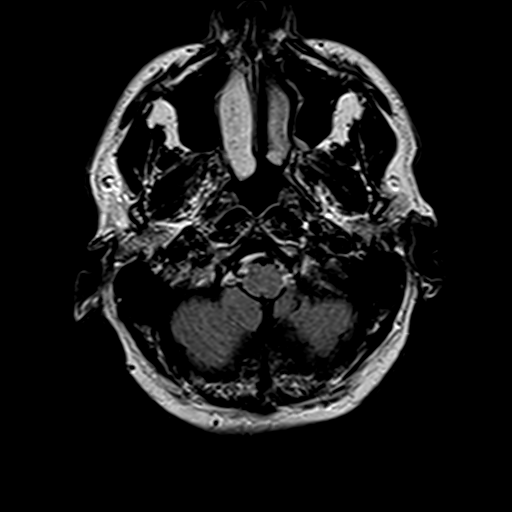
[im 15/30]
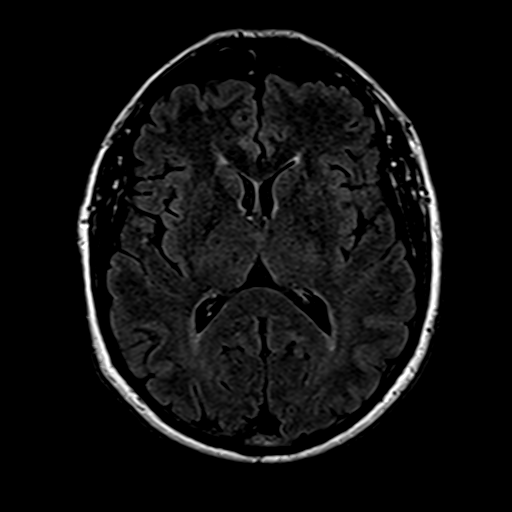
[im 30/30]
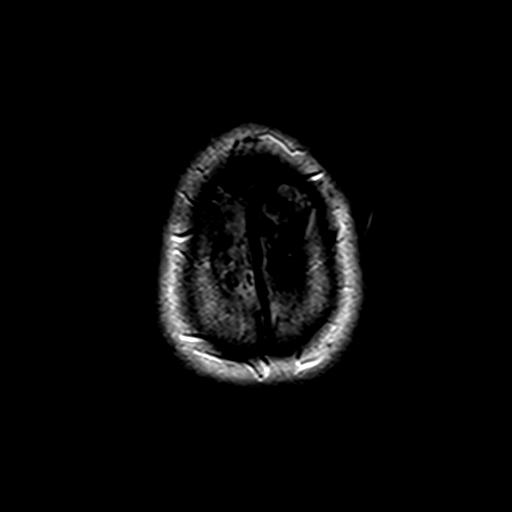

[Series 10: swi_images · axial · 4.0mm · 0.90mm/px · z∈[-30,+107]mm · 3 of 36 slices shown]
[im 1/36]
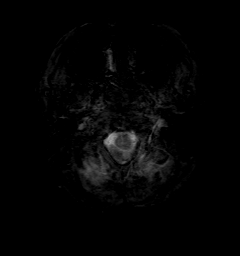
[im 18/36]
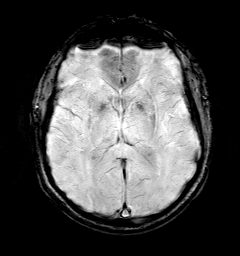
[im 36/36]
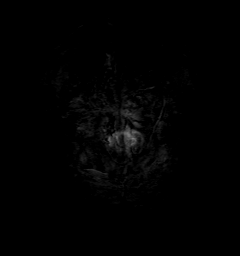

[Series 11: t1_mpr_tra · axial · 1.0mm · 0.71mm/px · z∈[-32,+108]mm · 13 of 144 slices shown]
[im 1/144]
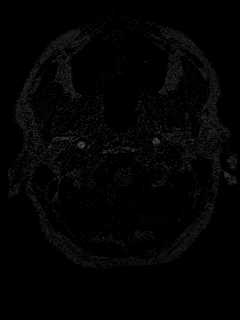
[im 12/144]
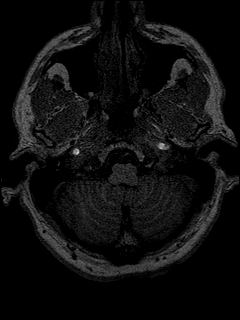
[im 24/144]
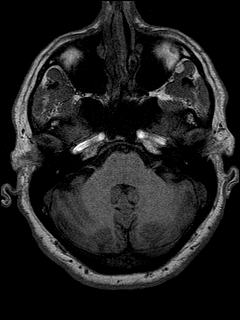
[im 36/144]
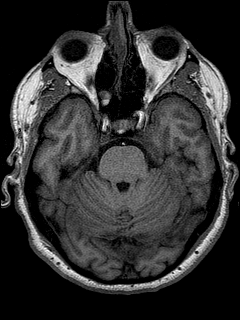
[im 48/144]
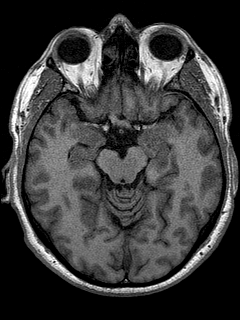
[im 60/144]
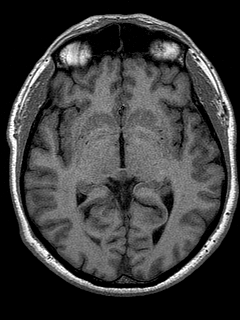
[im 72/144]
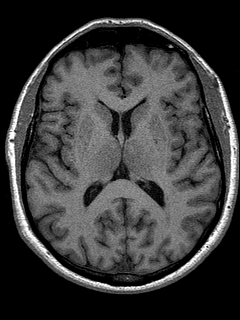
[im 84/144]
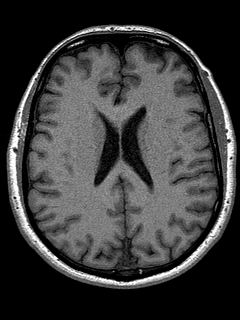
[im 96/144]
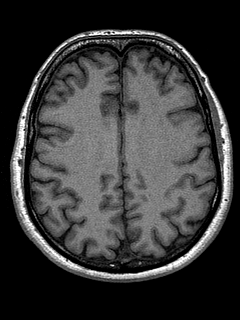
[im 108/144]
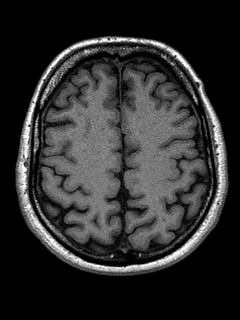
[im 120/144]
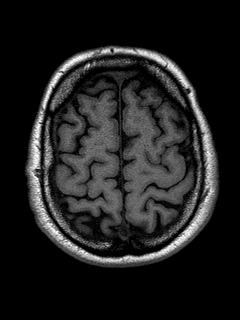
[im 132/144]
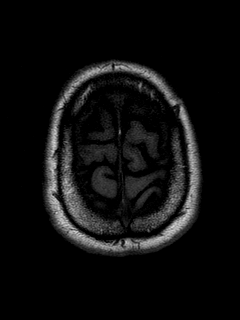
[im 144/144]
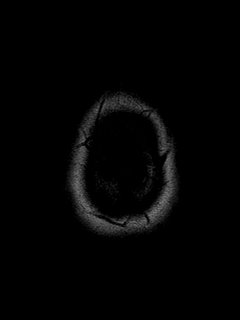

[Series 12: T2 · coronal · 5.0mm · 0.45mm/px · 2 of 25 slices shown (2 of 2)]
[im 1/25]
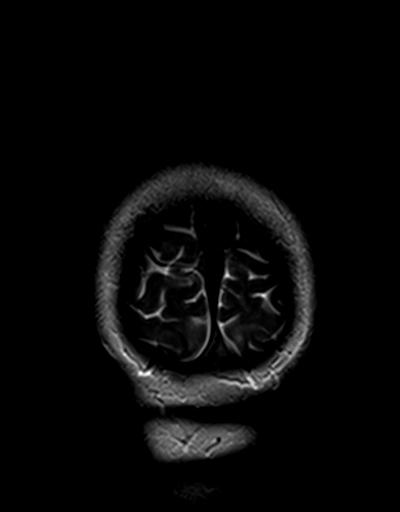
[im 25/25]
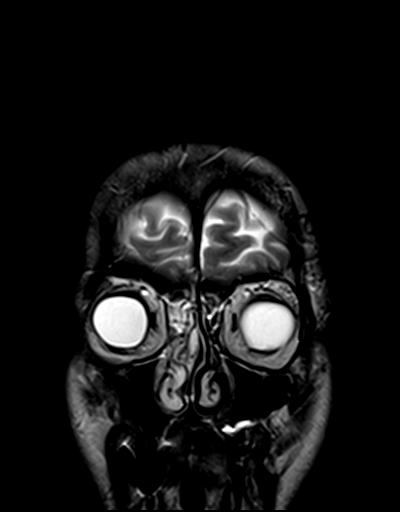

[48 of 48 positions shown; findings below may reference images not displayed]

FINDINGS: MRI HEAD WITHOUT CONTRAST

Brain:

Cerebral volume is normal.

A few scattered punctate foci of T2/FLAIR hyperintensity within the
cerebral white matter are nonspecific, but most often related to
chronic small vessel ischemia.

There is no acute infarct.

No evidence of intracranial mass.

No chronic intracranial blood products.

No extra-axial fluid collection.

No midline shift.

Partially empty sella turcica.

Vascular: Expected proximal arterial flow voids. Non dominant
intracranial left vertebral artery.

Skull and upper cervical spine: No focal marrow lesion

Sinuses/Orbits: Visualized orbits show no acute finding. Mild
frontal, ethmoid and left maxillary sinus mucosal thickening. Small
mucous retention cyst within a posterior right ethmoid air cell and
within the left maxillary sinus.

MR VENOGRAM WITHOUT CONTRAST

The superior sagittal sinus, internal cerebral veins, vein of PATALY,
straight sinus, transverse sinuses, sigmoid sinuses and visualized
jugular veins are patent. There is no appreciable intracranial
venous thrombosis.
IMPRESSION: MRI brain:

1. No evidence of acute intracranial abnormality.
2. A few scattered punctate foci of T2/FLAIR hyperintensity within
the cerebral white matter are nonspecific, but most often related to
chronic small vessel ischemia.
3. Mild paranasal sinus disease as described.

MRV head:

No evidence of intracranial venous thrombosis.

## 2020-01-07 IMAGING — MR MR MRV HEAD W/O CM
2 series · 23 of 48 positions shown · non-contrast
Comparison: MRA head [DATE].  CT head [DATE].

CLINICAL DATA: New onset of headaches after age 51. Stabbing
headache. Facial droop. Left-sided numbness. Venous thrombosis of
lower extremity. Headache, new or worsening. Additional history
provided by scanning [HOSPITAL] patient reports severe
left-sided headaches with right-sided facial droop for several
weeks, worse at night. Confusion.

EXAM:
MRI HEAD WITHOUT CONTRAST
MRV HEAD WITHOUT CONTRAST
TECHNIQUE: Multiplanar, multiecho pulse sequences of the brain and surrounding
structures were obtained without intravenous contrast. Angiographic
images of the intracranial venous structures were obtained using MRV
technique without intravenous contrast.

[Series 2: (id) coronal · coronal · 3.0mm · 0.49mm/px · 14 of 102 slices shown]
[im 1/102]
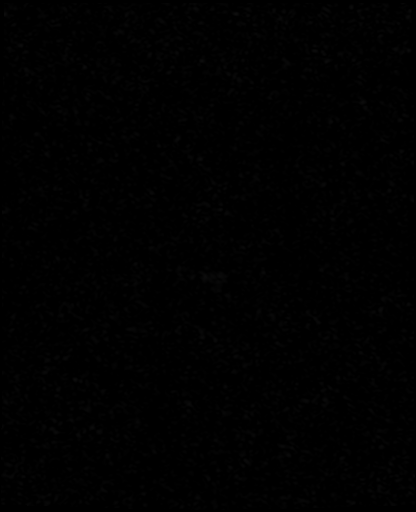
[im 5/102]
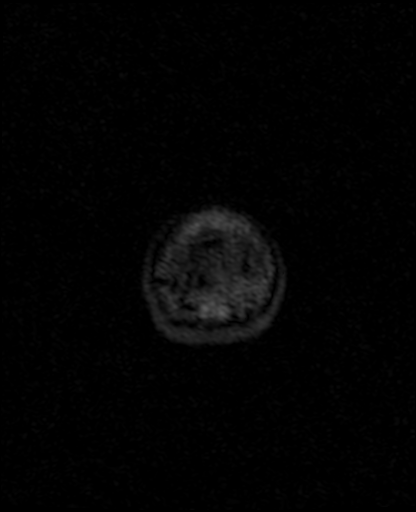
[im 10/102]
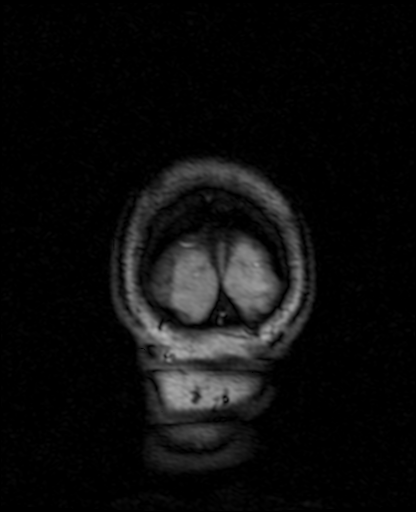
[im 14/102]
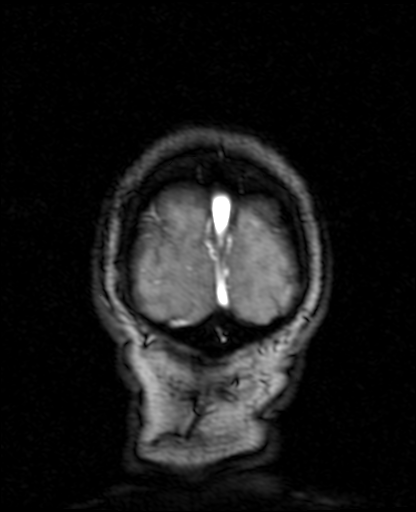
[im 19/102]
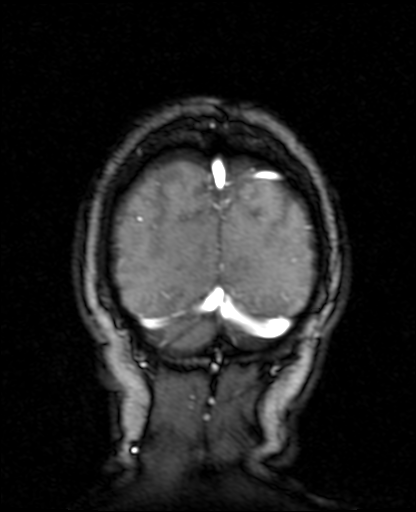
[im 23/102]
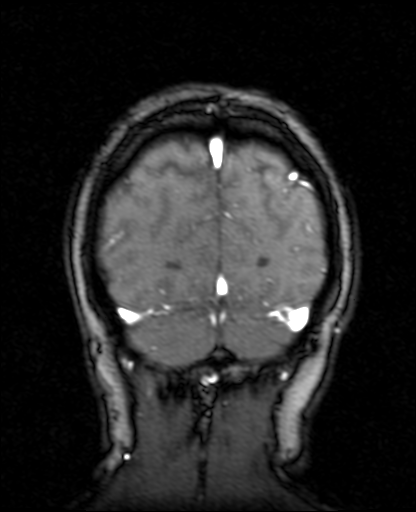
[im 33/102]
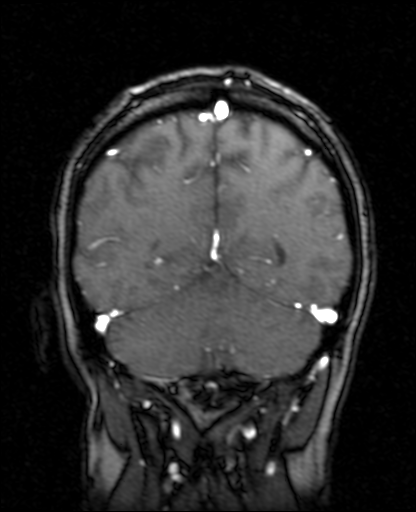
[im 46/102]
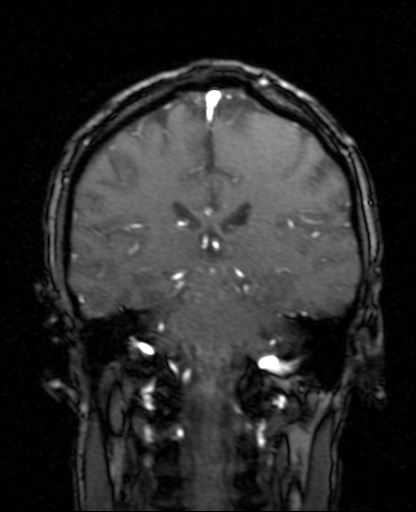
[im 51/102]
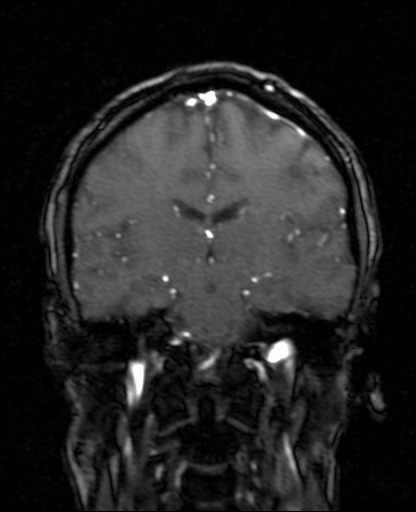
[im 56/102]
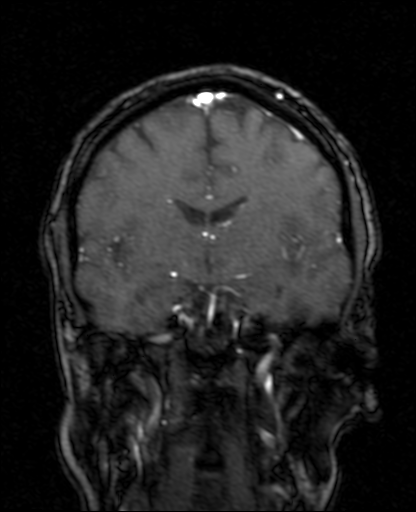
[im 69/102]
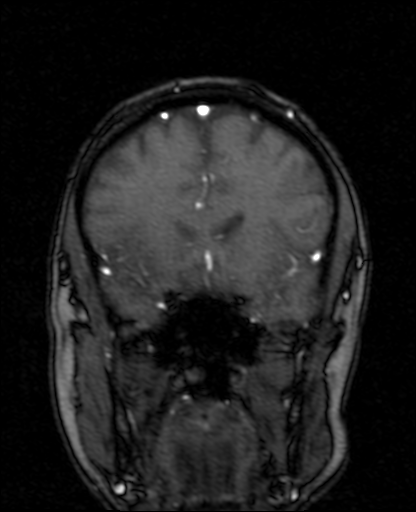
[im 83/102]
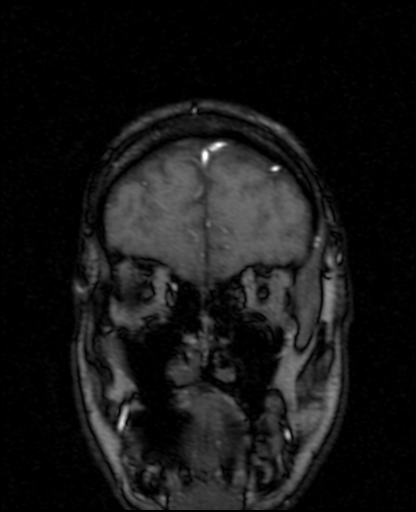
[im 88/102]
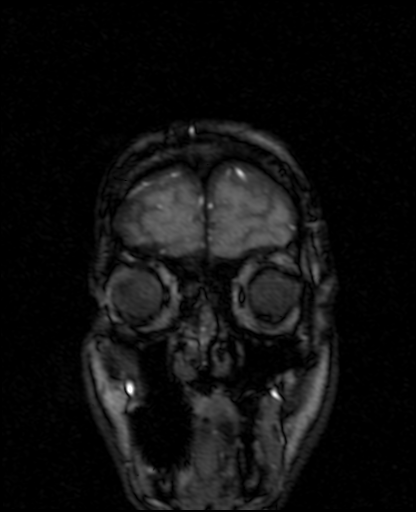
[im 97/102]
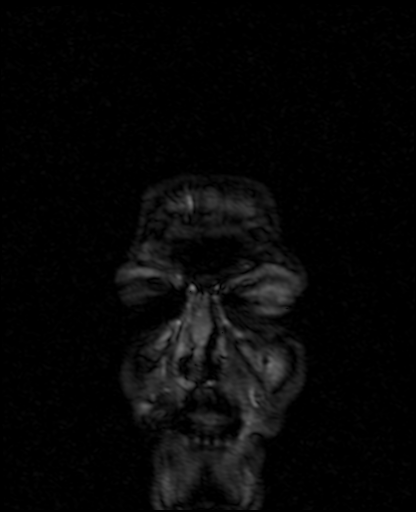

[Series 6: tof_3d sag · sagittal · 1.5mm · 0.43mm/px · 9 of 110 slices shown]
[im 5/110]
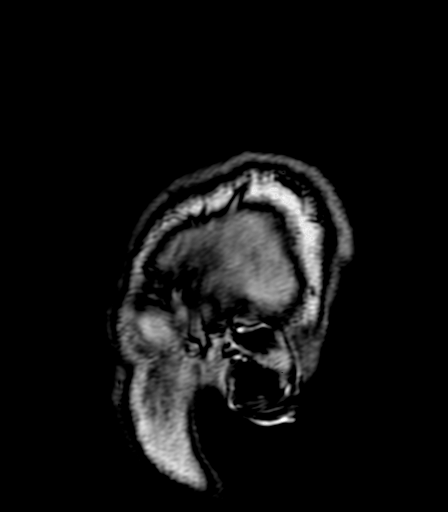
[im 19/110]
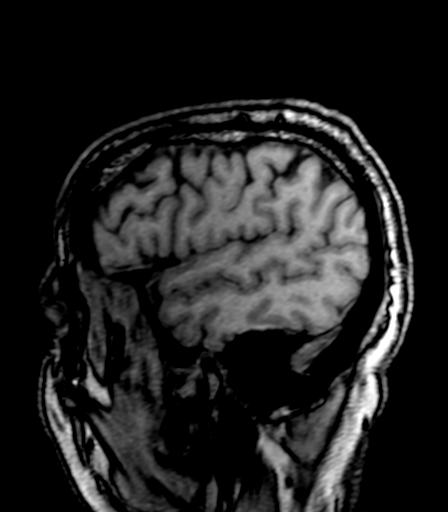
[im 32/110]
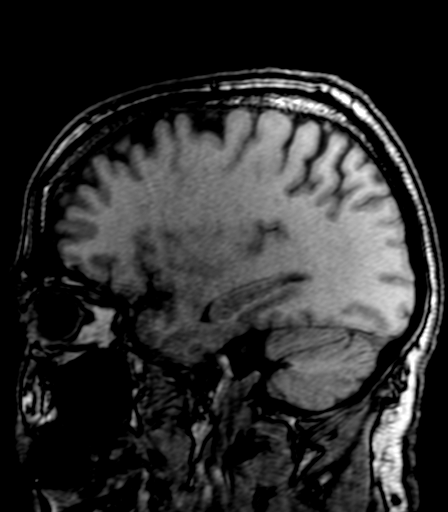
[im 46/110]
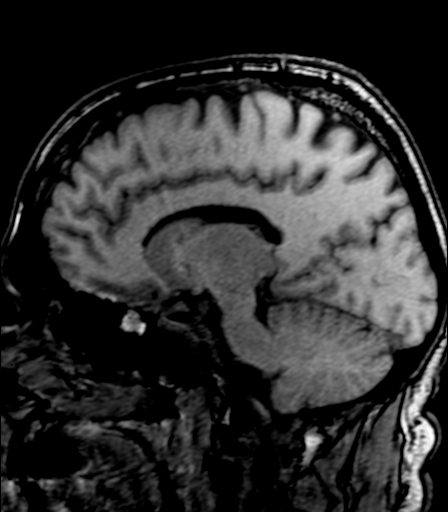
[im 55/110]
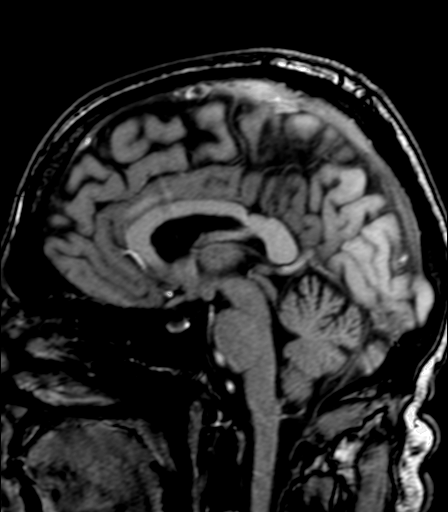
[im 64/110]
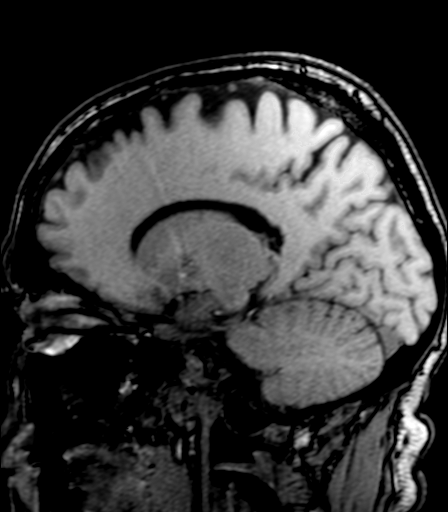
[im 78/110]
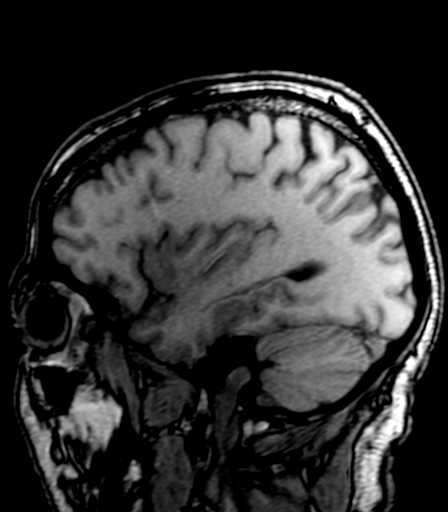
[im 91/110]
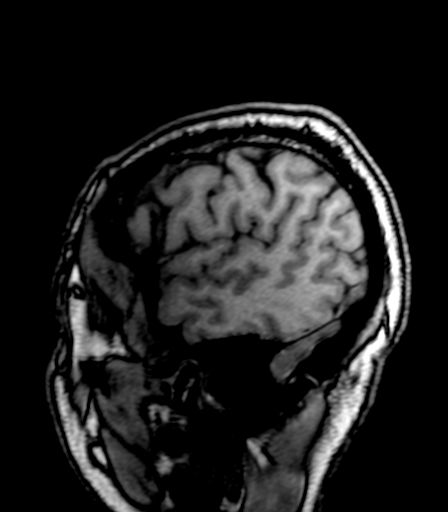
[im 105/110]
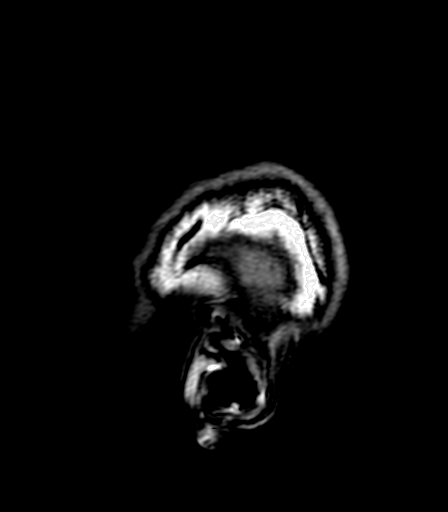

[23 of 48 positions shown; findings below may reference images not displayed]

FINDINGS: MRI HEAD WITHOUT CONTRAST

Brain:

Cerebral volume is normal.

A few scattered punctate foci of T2/FLAIR hyperintensity within the
cerebral white matter are nonspecific, but most often related to
chronic small vessel ischemia.

There is no acute infarct.

No evidence of intracranial mass.

No chronic intracranial blood products.

No extra-axial fluid collection.

No midline shift.

Partially empty sella turcica.

Vascular: Expected proximal arterial flow voids. Non dominant
intracranial left vertebral artery.

Skull and upper cervical spine: No focal marrow lesion

Sinuses/Orbits: Visualized orbits show no acute finding. Mild
frontal, ethmoid and left maxillary sinus mucosal thickening. Small
mucous retention cyst within a posterior right ethmoid air cell and
within the left maxillary sinus.

MR VENOGRAM WITHOUT CONTRAST

The superior sagittal sinus, internal cerebral veins, vein of PATALY,
straight sinus, transverse sinuses, sigmoid sinuses and visualized
jugular veins are patent. There is no appreciable intracranial
venous thrombosis.
IMPRESSION: MRI brain:

1. No evidence of acute intracranial abnormality.
2. A few scattered punctate foci of T2/FLAIR hyperintensity within
the cerebral white matter are nonspecific, but most often related to
chronic small vessel ischemia.
3. Mild paranasal sinus disease as described.

MRV head:

No evidence of intracranial venous thrombosis.

## 2020-01-11 LAB — URINALYSIS, ROUTINE W REFLEX MICROSCOPIC
Bilirubin, UA: NEGATIVE
Glucose, UA: NEGATIVE
Ketones, UA: NEGATIVE
Leukocytes,UA: NEGATIVE
Nitrite, UA: NEGATIVE
Protein,UA: NEGATIVE
RBC, UA: NEGATIVE
Specific Gravity, UA: 1.009 (ref 1.005–1.030)
Urobilinogen, Ur: 0.2 mg/dL (ref 0.2–1.0)
pH, UA: 6.5 (ref 5.0–7.5)

## 2020-01-11 LAB — LIPID PANEL WITH LDL/HDL RATIO
Cholesterol, Total: 203 mg/dL — ABNORMAL HIGH (ref 100–199)
HDL: 62 mg/dL (ref >39)
LDL Chol Calc (NIH): 125 mg/dL — ABNORMAL HIGH (ref 0–99)
LDL/HDL Ratio: 2 ratio (ref 0.0–3.6)
Triglycerides: 89 mg/dL (ref 0–149)
VLDL Cholesterol Cal: 16 mg/dL (ref 5–40)

## 2020-01-18 LAB — URINE CULTURE

## 2020-01-22 NOTE — Progress Notes (Deleted)
NEUROLOGY FOLLOW UP OFFICE NOTE  Logan Bush [id number]   Subjective:  Logan Bush is a 59 year old right-handed male with history of palpitations and superficial venous thrombosis who follows up for headache and facial droop.  UPDATE: MRI/MRA/MRV brain from December 11 and 13 personally reviewed showed no acute intracranial abnormality.  ***  HISTORY: He was diagnosed with acute superficial thrombosis of the right lower extremity in July and has since been on Eliquis.  In October, he started having severe left parietal/retro-orbital paroxysmal stabbing pain associated with flushed face and left conjunctival injection and severe itching of the left eye but no visual disturbance, speech disturbance, nausea, vomiting, photophobia, phonophobia, ptosis or nasal congestion.  Since onset of headaches, he has noted that the the right side of his mouth droops a little bit. It lasts about 3 minutes.  It occurs three or four times a day, usually in the morning, in the evening while watching TV and in the middle of the night (12 to 2 AM).  The treats with Tylenol or naproxen.  He has never had similar headaches in the past.  He reports remote history of headaches once in awhile.  He has history of epilepsy for which he was on medication between 78 and 58 years old.  Mother had headaches.  He did have a CT head performed on 05/31/2019 following a head injury, which was personally reviewed and was normal.  08/28/2019 EKG:  Normal sinus rhythm with rate of 73 bpm 09/27/2019 ECHOCARDIOGRAM:  EF 55-60% 10/08/2019 LOWER EXTREMITY VENOUS DUPLEX:  Age indeterminate superficial vein thrombosis involving the right great saphenous vein.  Thrombus is 1.78 cm from the deep venous system.  Findings appear essentially unchanged or slightly improved from prior exam in July.  No DVT.  PAST MEDICAL HISTORY: Past Medical History:  Diagnosis Date  . Allergy   . DVT (deep venous thrombosis) (HCC)   .  Palpitation   . Vertigo     MEDICATIONS: Current Outpatient Medications on File Prior to Visit  Medication Sig Dispense Refill  . acetaminophen (TYLENOL) 650 MG CR tablet Take 650 mg by mouth every 8 (eight) hours as needed for pain.     Marland Kitchen apixaban (ELIQUIS) 5 MG TABS tablet Take 1 tablet (5 mg total) by mouth 2 (two) times daily. (Patient taking differently: Take 10 mg by mouth 2 (two) times daily. ) 60 tablet 1  . [DISCONTINUED] RIVAROXABAN (XARELTO) VTE STARTER PACK (15 & 20 MG TABLETS) Follow package directions: Take one 15mg  tablet by mouth twice a day. On day 22, switch to one 20mg  tablet once a day. Take with food. 51 each 0   No current facility-administered medications on file prior to visit.    ALLERGIES: Allergies  Allergen Reactions  . Aspirin Other (See Comments)    Makes GI bleeding    FAMILY HISTORY: Family History  Problem Relation Age of Onset  . Heart attack Mother   . Hypertension Mother   . Cancer Father        prostate cancer  . Asthma Father   . Cancer Sister        breast cancer  . Cancer Maternal Grandmother        Breast Cancer  . Colon cancer Neg Hx   . Colon polyps Neg Hx    SOCIAL HISTORY: Social History   Socioeconomic History  . Marital status: Married    Spouse name: Not on file  . Number of children: 3  .  Years of education: Not on file  . Highest education level: Not on file  Occupational History  . Not on file  Tobacco Use  . Smoking status: Passive Smoke Exposure - Never Smoker  . Smokeless tobacco: Never Used  Vaping Use  . Vaping Use: Never used  Substance and Sexual Activity  . Alcohol use: No  . Drug use: No  . Sexual activity: Not on file  Other Topics Concern  . Not on file  Social History Narrative   Right handed   Social Determinants of Health   Financial Resource Strain: Not on file  Food Insecurity: Not on file  Transportation Needs: Not on file  Physical Activity: Not on file  Stress: Not on file  Social  Connections: Not on file  Intimate Partner Violence: Not on file     Objective:  *** General: No acute distress.  Patient appears well-groomed.   Head:  Normocephalic/atraumatic Eyes:  Fundi examined but not visualized Neck: supple, no paraspinal tenderness, full range of motion Heart:  Regular rate and rhythm Lungs:  Clear to auscultation bilaterally Back: No paraspinal tenderness Neurological Exam: alert and oriented to person, place, and time. Attention span and concentration intact, recent and remote memory intact, fund of knowledge intact.  Speech fluent and not dysarthric, language intact.  CN II-XII intact. Bulk and tone normal, muscle strength 5/5 throughout.  Sensation to light touch, temperature and vibration intact.  Deep tendon reflexes 2+ throughout, toes downgoing.  Finger to nose and heel to shin testing intact.  Gait normal, Romberg negative.   Assessment/Plan:   1.  New onset left-sided paroxysmal stabbing headache, ***.   ***  Logan Millet, DO  CC: ***

## 2020-01-28 ENCOUNTER — Ambulatory Visit: Payer: Self-pay | Admitting: Neurology

## 2020-02-01 ENCOUNTER — Encounter: Payer: Self-pay | Admitting: Internal Medicine

## 2020-02-01 NOTE — Progress Notes (Deleted)
NEUROLOGY FOLLOW UP OFFICE NOTE  Adekunle Rohrbach [id number]   Subjective:  Logan Bush is a 60 year old right-handed male with history of palpitations and superficial venous thrombosis who follows up for headache.  UPDATE: MRI/MRA/MRV of the brain on 12/11 & 13/2021 were personally reviewed and showed mild chronic small vessel ischemic changes but no acute stroke, mass lesion, cerebral aneurysm or intracranial venous thrombosis  HISTORY: He was diagnosed with acute superficial thrombosis of the right lower extremity in July and has since been on Eliquis.  In October 2021, he started having severe left parietal/retro-orbital paroxysmal stabbing pain associated with flushed face and left conjunctival injection and severe itching of the left eye but no visual disturbance, speech disturbance, nausea, vomiting, photophobia, phonophobia, ptosis or nasal congestion.  Since onset of headaches, he has noted that the the right side of his mouth droops a little bit. It lasts about 3 minutes.  It occurs three or four times a day, usually in the morning, in the evening while watching TV and in the middle of the night (12 to 2 AM).  The treats with Tylenol or naproxen.  He has never had similar headaches in the past.  He reports remote history of headaches once in awhile.  He has history of epilepsy for which he was on medication between 53 and 50 years old.  Mother had headaches.  He did have a CT head performed on 05/31/2019 following a head injury, which was normal.  08/28/2019 EKG:  Normal sinus rhythm with rate of 73 bpm 09/27/2019 ECHOCARDIOGRAM:  EF 55-60% 10/08/2019 LOWER EXTREMITY VENOUS DUPLEX:  Age indeterminate superficial vein thrombosis involving the right great saphenous vein.  Thrombus is 1.78 cm from the deep venous system.  Findings appear essentially unchanged or slightly improved from prior exam in July.  No DVT.  PAST MEDICAL HISTORY: Past Medical History:  Diagnosis Date  .  Allergy   . DVT (deep venous thrombosis) (HCC)   . Palpitation   . Vertigo     MEDICATIONS: Current Outpatient Medications on File Prior to Visit  Medication Sig Dispense Refill  . acetaminophen (TYLENOL) 650 MG CR tablet Take 650 mg by mouth every 8 (eight) hours as needed for pain.     Marland Kitchen apixaban (ELIQUIS) 5 MG TABS tablet Take 1 tablet (5 mg total) by mouth 2 (two) times daily. (Patient taking differently: Take 10 mg by mouth 2 (two) times daily. ) 60 tablet 1  . [DISCONTINUED] RIVAROXABAN (XARELTO) VTE STARTER PACK (15 & 20 MG TABLETS) Follow package directions: Take one 15mg  tablet by mouth twice a day. On day 22, switch to one 20mg  tablet once a day. Take with food. 51 each 0   No current facility-administered medications on file prior to visit.    ALLERGIES: Allergies  Allergen Reactions  . Aspirin Other (See Comments)    Makes GI bleeding    FAMILY HISTORY: Family History  Problem Relation Age of Onset  . Heart attack Mother   . Hypertension Mother   . Cancer Father        prostate cancer  . Asthma Father   . Cancer Sister        breast cancer  . Cancer Maternal Grandmother        Breast Cancer  . Colon cancer Neg Hx   . Colon polyps Neg Hx     SOCIAL HISTORY: Social History   Socioeconomic History  . Marital status: Married    Spouse name: Not  on file  . Number of children: 3  . Years of education: Not on file  . Highest education level: Not on file  Occupational History  . Not on file  Tobacco Use  . Smoking status: Passive Smoke Exposure - Never Smoker  . Smokeless tobacco: Never Used  Vaping Use  . Vaping Use: Never used  Substance and Sexual Activity  . Alcohol use: No  . Drug use: No  . Sexual activity: Not on file  Other Topics Concern  . Not on file  Social History Narrative   Right handed   Social Determinants of Health   Financial Resource Strain: Not on file  Food Insecurity: Not on file  Transportation Needs: Not on file   Physical Activity: Not on file  Stress: Not on file  Social Connections: Not on file  Intimate Partner Violence: Not on file     Objective:  *** General: No acute distress.  Patient appears well-groomed.   Head:  Normocephalic/atraumatic Eyes:  Fundi examined but not visualized Neck: supple, no paraspinal tenderness, full range of motion Heart:  Regular rate and rhythm Lungs:  Clear to auscultation bilaterally Back: No paraspinal tenderness Neurological Exam: alert and oriented to person, place, and time. Attention span and concentration intact, recent and remote memory intact, fund of knowledge intact.  Speech fluent and not dysarthric, language intact.  CN II-XII intact. Bulk and tone normal, muscle strength 5/5 throughout.  Sensation to light touch, temperature and vibration intact.  Deep tendon reflexes 2+ throughout, toes downgoing.  Finger to nose and heel to shin testing intact.  Gait normal, Romberg negative.   Assessment/Plan:   1.  Left sided paroxysmal stabbing headache - cluster headache but presence of potential left facial weakness and left sided extremity numbness, consider migraine as well.  ***  Shon Millet, DO

## 2020-02-02 ENCOUNTER — Ambulatory Visit: Payer: Self-pay | Attending: Internal Medicine

## 2020-02-02 DIAGNOSIS — Z23 Encounter for immunization: Secondary | ICD-10-CM

## 2020-02-04 ENCOUNTER — Ambulatory Visit: Payer: Self-pay | Admitting: Neurology

## 2020-02-11 NOTE — Progress Notes (Signed)
NEUROLOGY FOLLOW UP OFFICE NOTE  Logan Bush [id number]   Subjective:  Logan Bush is a 60 year old right-handed male with history of palpitations and superficial venous thrombosis who follows up for headache.  UPDATE: MRI/MRA/MRV of the brain on 12/11 & 13/2021 were personally reviewed and showed mild chronic small vessel ischemic changes but no acute stroke, mass lesion, cerebral aneurysm or intracranial venous thrombosis  HISTORY: He was diagnosed with acute superficial thrombosis of the right lower extremity in July 2021 and has since been on Eliquis.  In October 2021, he started having severe left parietal/retro-orbital paroxysmal stabbing pain associated with flushed face and left conjunctival injection and severe itching of the left eye but no visual disturbance, speech disturbance, nausea, vomiting, photophobia, phonophobia, ptosis or nasal congestion.  Since onset of headaches, he has noted that the the right side of his mouth droops a little bit. It lasts about 3 minutes.  It occurs three or four times a day, usually in the morning, in the evening while watching TV and in the middle of the night (12 to 2 AM).  The treats with Tylenol or naproxen.  He has never had similar headaches in the past.  He reports remote history of headaches once in awhile.  He has history of epilepsy for which he was on medication between 5 and 42 years old.  Mother had headaches.  He did have a CT head performed on 05/31/2019 following a head injury, which was normal.  08/28/2019 EKG:  Normal sinus rhythm with rate of 73 bpm 09/27/2019 ECHOCARDIOGRAM:  EF 55-60% 10/08/2019 LOWER EXTREMITY VENOUS DUPLEX:  Age indeterminate superficial vein thrombosis involving the right great saphenous vein.  Thrombus is 1.78 cm from the deep venous system.  Findings appear essentially unchanged or slightly improved from prior exam in July.  No DVT.  PAST MEDICAL HISTORY: Past Medical History:  Diagnosis Date  .  Allergy   . DVT (deep venous thrombosis) (HCC)   . Palpitation   . Vertigo     MEDICATIONS: Current Outpatient Medications on File Prior to Visit  Medication Sig Dispense Refill  . acetaminophen (TYLENOL) 650 MG CR tablet Take 650 mg by mouth every 8 (eight) hours as needed for pain.     Marland Kitchen apixaban (ELIQUIS) 5 MG TABS tablet Take 1 tablet (5 mg total) by mouth 2 (two) times daily. (Patient taking differently: Take 10 mg by mouth 2 (two) times daily. ) 60 tablet 1  . [DISCONTINUED] RIVAROXABAN (XARELTO) VTE STARTER PACK (15 & 20 MG TABLETS) Follow package directions: Take one 15mg  tablet by mouth twice a day. On day 22, switch to one 20mg  tablet once a day. Take with food. 51 each 0   No current facility-administered medications on file prior to visit.    ALLERGIES: Allergies  Allergen Reactions  . Aspirin Other (See Comments)    Makes GI bleeding    FAMILY HISTORY: Family History  Problem Relation Age of Onset  . Heart attack Mother   . Hypertension Mother   . Cancer Father        prostate cancer  . Asthma Father   . Cancer Sister        breast cancer  . Cancer Maternal Grandmother        Breast Cancer  . Colon cancer Neg Hx   . Colon polyps Neg Hx     SOCIAL HISTORY: Social History   Socioeconomic History  . Marital status: Married    Spouse name:  Not on file  . Number of children: 3  . Years of education: Not on file  . Highest education level: Not on file  Occupational History  . Not on file  Tobacco Use  . Smoking status: Passive Smoke Exposure - Never Smoker  . Smokeless tobacco: Never Used  Vaping Use  . Vaping Use: Never used  Substance and Sexual Activity  . Alcohol use: No  . Drug use: No  . Sexual activity: Not on file  Other Topics Concern  . Not on file  Social History Narrative   Right handed   Social Determinants of Health   Financial Resource Strain: Not on file  Food Insecurity: Not on file  Transportation Needs: Not on file   Physical Activity: Not on file  Stress: Not on file  Social Connections: Not on file  Intimate Partner Violence: Not on file     Objective:  Blood pressure 119/73, pulse 79, height 5\' 8"  (1.727 m), weight 179 lb 12.8 oz (81.6 kg), SpO2 97 %. General: No acute distress.  Patient appears well-groomed.   Neurological Exam: alert and oriented to person, place, and time. Attention span and concentration intact, recent and remote memory intact, fund of knowledge intact.  Speech fluent and not dysarthric, language intact.  Slight lower facial weakness.   Assessment/Plan:   1.  Left sided paroxysmal stabbing headache, etiology not clear - consider cluster headache vs paroxysmal hemicrania although duration is shorter than expected.  However, these diagnoses do not explain the right sided facial weakness.  Such stroke-like symptoms may be seen with migraine, however the duration of these events are much too short for migraine.   1.  Will treat with a broad-spectrum headache prophylactic:  topiramate 25mg  at bedtime for one week, followed by 50mg  at bedtime.  We can increase dose in 5 weeks if needed. 2.  Keep headache diary 3.  Limit use of pain relievers to no more than 2 days out of week to prevent risk of rebound or medication-overuse headache. 4.  Follow up 6 months.  , DO

## 2020-02-13 ENCOUNTER — Other Ambulatory Visit: Payer: Self-pay

## 2020-02-13 ENCOUNTER — Encounter: Payer: Self-pay | Admitting: Neurology

## 2020-02-13 ENCOUNTER — Ambulatory Visit (INDEPENDENT_AMBULATORY_CARE_PROVIDER_SITE_OTHER): Payer: Self-pay | Admitting: Neurology

## 2020-02-13 VITALS — BP 119/73 | HR 79 | Ht 68.0 in | Wt 179.8 lb

## 2020-02-13 DIAGNOSIS — G44039 Episodic paroxysmal hemicrania, not intractable: Secondary | ICD-10-CM

## 2020-02-13 MED ORDER — TOPIRAMATE 25 MG PO TABS: ORAL_TABLET | ORAL | 0 refills | Status: DC

## 2020-02-13 NOTE — Patient Instructions (Signed)
1.  Start topiramate 25mg  tablet.  Take 1 tablet at bedtime for one week, then increase to 2 tablets at bedtime.  When you need a refill, contact me with an update and we can continue current dose or increase dose if needed. 2.  Keep headache diary 3.  Follow up in 6 months.    1. Inicie la tableta de topiramato de 25 mg. [place] 1 tableta a la hora de acostarse durante [place], luego aumente a 2 tabletas a la hora de [place]. Cuando necesite una recarga, contcteme con una actualizacin y podemos continuar con la dosis actual o aumentar la dosis si es necesario. 2. Mantenga un diario de dolor de cabeza 3. Seguimiento en 6 meses.

## 2020-02-29 ENCOUNTER — Ambulatory Visit: Payer: Self-pay | Admitting: Cardiology

## 2020-03-11 ENCOUNTER — Other Ambulatory Visit: Payer: Self-pay

## 2020-03-11 ENCOUNTER — Ambulatory Visit (INDEPENDENT_AMBULATORY_CARE_PROVIDER_SITE_OTHER): Payer: Self-pay | Admitting: Internal Medicine

## 2020-03-11 ENCOUNTER — Encounter: Payer: Self-pay | Admitting: Internal Medicine

## 2020-03-11 VITALS — BP 138/82 | HR 71 | Temp 97.1°F | Ht 66.0 in | Wt 182.4 lb

## 2020-03-11 DIAGNOSIS — R198 Other specified symptoms and signs involving the digestive system and abdomen: Secondary | ICD-10-CM

## 2020-03-11 MED ORDER — HYOSCYAMINE SULFATE ER 0.375 MG PO TB12: 0.3750 mg | ORAL_TABLET | Freq: Every morning | ORAL | 11 refills | Status: DC

## 2020-03-11 NOTE — Patient Instructions (Addendum)
Stop Metamucil  Begin LEV-BID one tablet each morning (disp 30 with 11 refills)  Information on IBS provided  Keep a stool diary on a spare calendar  OV with [place] in 4-6 weeks  We may need to adjust our treatment plan depending on your progress when you come back to see [place].

## 2020-03-11 NOTE — Progress Notes (Signed)
Primary Care Physician:  [facility] Primary Gastroenterologist:  Dr. Jena Gauss  Pre-Procedure History & Physical: HPI:  Logan Bush is a 60 y.o. male here for further evaluation of altered bowel function.  States he has had issues going back at least 8 years.  Patient complains of intermittent sudden urgency to have a bowel movement  - sometimes cannot make it to the bathroom.  Has 5-6 bowel movements daily.  Patient shared photographs of a typical BM.  Appears [place] 5.  May wipe a little blood if he has had multiple bowel movements daily.  He does not have nocturnal stooling.  He never has watery diarrhea.  He never goes a day without a bowel movement.  No upper GI tract symptoms such as nausea, vomiting, early satiety, GERD, dysphagia.  No abdominal pain.  Weight has been stable going back for at least 3 years based on our records. No temporal relationship with diet necessarily.  No issues with dairy.  Does not eat out.  Stays at home sounds like he eats a good diet based on recall.  He takes Metamucil sporadically without much improvement in symptoms. History of a hyperplastic polyp removed in [place] years ago; colonoscopy by me in 2019 demonstrated a small polyp which was removed but did not survive processing.  Normal colonic and terminal ileal mucosa.  Daily fiber was recommended at that time.  Recent blood clot right lower extremity for which he was started on Eliquis. He is a [place] immigrant.   Past Medical History:  Diagnosis Date  . Allergy   . DVT (deep venous thrombosis) (HCC)   . Palpitation   . Vertigo     Past Surgical History:  Procedure Laterality Date  . COLONOSCOPY N/A 08/05/2017   Procedure: COLONOSCOPY;  Surgeon: Corbin Ade, MD;  [facility];  Service: Endoscopy;  Laterality: N/A;  1:45pm-rescheduled to 7/12 @9 :30am per  . POLYPECTOMY  10/2012  . POLYPECTOMY  08/05/2017   Procedure: POLYPECTOMY;  Surgeon: 10/06/2017, MD;  Location: [facility];  Service: Endoscopy;;    Prior to Admission medications   Medication Sig Start Date End Date Taking? Authorizing Provider  acetaminophen (TYLENOL) 650 MG CR tablet Take 650 mg by mouth every 8 (eight) hours as needed for pain.    Yes [provider]  apixaban (ELIQUIS) 5 MG TABS tablet Take 1 tablet (5 mg total) by mouth 2 (two) times daily. 08/22/19  Yes 08/24/19 M, PA-C  psyllium (METAMUCIL) 58.6 % powder Take 1 packet by mouth daily. X 15 days   Yes [provider]  sulfamethoxazole-trimethoprim (BACTRIM DS) 800-160 MG tablet Take 1 tablet by mouth 2 (two) times daily. 01/30/20  Yes [provider]  RIVAROXABAN 03/29/20) VTE STARTER PACK (15 & 20 MG TABLETS) Follow package directions: Take one 15mg  tablet by mouth twice a day. On day 22, switch to one 20mg  tablet once a day. Take with food. 08/01/19 08/01/19  , MD    Allergies as of 03/11/2020 - Review Complete 03/11/2020  Allergen Reaction Noted  . Aspirin Other (See Comments) 10/14/2015    Family History  Problem Relation Age of Onset  . Heart attack Mother   . Hypertension Mother   . Cancer Father        prostate cancer  . Asthma Father   . Cancer Sister        breast cancer  . Cancer Maternal Grandmother  Breast Cancer  . Colon cancer Neg Hx   . Colon polyps Neg Hx     Social History   Socioeconomic History  . Marital status: Married    Spouse name: Not on file  . Number of children: 3  . Years of education: Not on file  . Highest education level: Not on file  Occupational History  . Not on file  Tobacco Use  . Smoking status: Passive Smoke Exposure - Never Smoker  . Smokeless tobacco: Never Used  Vaping Use  . Vaping Use: Never used  Substance and Sexual Activity  . Alcohol use: No  . Drug use: No  . Sexual activity: Not on file  Other Topics Concern  . Not on file  Social History Narrative   Right handed   Social  Determinants of Health   Financial Resource Strain: Not on file  Food Insecurity: Not on file  Transportation Needs: Not on file  Physical Activity: Not on file  Stress: Not on file  Social Connections: Not on file  Intimate Partner Violence: Not on file    Review of Systems: See HPI, otherwise negative ROS  Physical Exam: BP 138/82   Pulse 71   Temp (!) 97.1 F (36.2 C)   Ht 5\' 6"  (1.676 m)   Wt 182 lb 6.4 oz (82.7 kg)   BMI 29.44 kg/m  General:   Alert,  , pleasant and cooperative in NAD Neck:  Supple; no masses or thyromegaly. No significant cervical adenopathy. Lungs:  Clear throughout to auscultation.   No wheezes, crackles, or rhonchi. No acute distress. Heart:  Regular rate and rhythm; no murmurs, clicks, rubs,  or gallops. Abdomen: Non-distended, normal bowel sounds.  Soft and nontender without appreciable mass or hepatosplenomegaly.  Pulses:  Normal pulses noted. Extremities:  Without clubbing or edema. Rectal: No external lesions.  He has somewhat snug anal canal in fact he does have a degree of anal stenosis.  No mass in the rectal vault scant brown stool in the rectal vault Hemoccult negative.   Impression/Plan: Very pleasant 60 year old gentleman with chronic symptoms of bowel urgency, occasional incontinence.  Stools consistently in the [facility] 5 category .  By my exam, he has mild anal stenosis but I do not think it is critical.  Based on his history and provided photographs, I doubt were dealing with any sort of overflow incontinence.  Colonoscopy not too long ago is reassuring.  He never has watery diarrhea and never goes a day without a bowel movement.  Occasional paper hematochezia likely secondary to local irritation.  More likely we are dealing with a mixed IBS picture.  Recommendations:  Stop Metamucil  Begin LEV-BID one tablet each morning (disp 30 with 11 refills)  Information on IBS provided  Keep a stool diary on a spare calendar  OV with  [facility] in 4-6 weeks  We may need to adjust treatment plan depending progress when you come back to see [place].    Notice: This dictation was prepared with Dragon dictation along with smaller phrase technology. Any transcriptional errors that result from this process are unintentional and may not be corrected upon review.

## 2020-03-13 ENCOUNTER — Other Ambulatory Visit: Payer: Self-pay

## 2020-03-13 ENCOUNTER — Encounter (HOSPITAL_COMMUNITY): Payer: Self-pay

## 2020-03-13 ENCOUNTER — Emergency Department (HOSPITAL_COMMUNITY)
Admission: EM | Admit: 2020-03-13 | Discharge: 2020-03-13 | Disposition: A | Discharge status: Home / Self Care | Payer: Self-pay | Attending: Emergency Medicine | Admitting: Emergency Medicine

## 2020-03-13 ENCOUNTER — Emergency Department (HOSPITAL_COMMUNITY): Payer: Self-pay

## 2020-03-13 DIAGNOSIS — R079 Chest pain, unspecified: Secondary | ICD-10-CM | POA: Insufficient documentation

## 2020-03-13 DIAGNOSIS — G44219 Episodic tension-type headache, not intractable: Secondary | ICD-10-CM | POA: Insufficient documentation

## 2020-03-13 DIAGNOSIS — M79602 Pain in left arm: Secondary | ICD-10-CM | POA: Insufficient documentation

## 2020-03-13 DIAGNOSIS — Z86718 Personal history of other venous thrombosis and embolism: Secondary | ICD-10-CM | POA: Insufficient documentation

## 2020-03-13 DIAGNOSIS — Z7722 Contact with and (suspected) exposure to environmental tobacco smoke (acute) (chronic): Secondary | ICD-10-CM | POA: Insufficient documentation

## 2020-03-13 DIAGNOSIS — K625 Hemorrhage of anus and rectum: Secondary | ICD-10-CM | POA: Insufficient documentation

## 2020-03-13 DIAGNOSIS — M79605 Pain in left leg: Secondary | ICD-10-CM | POA: Insufficient documentation

## 2020-03-13 DIAGNOSIS — R202 Paresthesia of skin: Secondary | ICD-10-CM | POA: Insufficient documentation

## 2020-03-13 DIAGNOSIS — R519 Headache, unspecified: Secondary | ICD-10-CM

## 2020-03-13 DIAGNOSIS — Z79899 Other long term (current) drug therapy: Secondary | ICD-10-CM | POA: Insufficient documentation

## 2020-03-13 DIAGNOSIS — Z7901 Long term (current) use of anticoagulants: Secondary | ICD-10-CM | POA: Insufficient documentation

## 2020-03-13 LAB — CBC WITH DIFFERENTIAL/PLATELET
Abs Immature Granulocytes: 0.01 10*3/uL (ref 0.00–0.07)
Basophils Absolute: 0 10*3/uL (ref 0.0–0.1)
Basophils Relative: 1 %
Eosinophils Absolute: 0.2 10*3/uL (ref 0.0–0.5)
Eosinophils Relative: 4 %
HCT: 43.7 % (ref 39.0–52.0)
Hemoglobin: 14.5 g/dL (ref 13.0–17.0)
Immature Granulocytes: 0 %
Lymphocytes Relative: 32 %
Lymphs Abs: 1.2 10*3/uL (ref 0.7–4.0)
MCH: 30.4 pg (ref 26.0–34.0)
MCHC: 33.2 g/dL (ref 30.0–36.0)
MCV: 91.6 fL (ref 80.0–100.0)
Monocytes Absolute: 0.4 10*3/uL (ref 0.1–1.0)
Monocytes Relative: 10 %
Neutro Abs: 2 10*3/uL (ref 1.7–7.7)
Neutrophils Relative %: 53 %
Platelets: 198 10*3/uL (ref 150–400)
RBC: 4.77 MIL/uL (ref 4.22–5.81)
RDW: 12.3 % (ref 11.5–15.5)
WBC: 3.8 10*3/uL — ABNORMAL LOW (ref 4.0–10.5)
nRBC: 0 % (ref 0.0–0.2)

## 2020-03-13 LAB — COMPREHENSIVE METABOLIC PANEL
ALT: 28 U/L (ref 0–44)
AST: 23 U/L (ref 15–41)
Albumin: 4.3 g/dL (ref 3.5–5.0)
Alkaline Phosphatase: 48 U/L (ref 38–126)
Anion gap: 6 (ref 5–15)
BUN: 21 mg/dL — ABNORMAL HIGH (ref 6–20)
CO2: 27 mmol/L (ref 22–32)
Calcium: 9.4 mg/dL (ref 8.9–10.3)
Chloride: 106 mmol/L (ref 98–111)
Creatinine, Ser: 1.04 mg/dL (ref 0.61–1.24)
GFR, Estimated: >60 mL/min (ref >60)
Glucose, Bld: 86 mg/dL (ref 70–99)
Potassium: 3.6 mmol/L (ref 3.5–5.1)
Sodium: 139 mmol/L (ref 135–145)
Total Bilirubin: 1.5 mg/dL — ABNORMAL HIGH (ref 0.3–1.2)
Total Protein: 6.7 g/dL (ref 6.5–8.1)

## 2020-03-13 LAB — TROPONIN I (HIGH SENSITIVITY)
Troponin I (High Sensitivity): 3 ng/L (ref <18)
Troponin I (High Sensitivity): 3 ng/L (ref <18)

## 2020-03-13 LAB — POC OCCULT BLOOD, ED: Fecal Occult Bld: POSITIVE — AB

## 2020-03-13 IMAGING — DX DG CHEST 2V
2 series · 2 of 2 positions shown · non-contrast
Comparison: [DATE]

CLINICAL DATA: Chest pain, LEFT side weakness

EXAM:
CHEST - 2 VIEW

[chest pa]
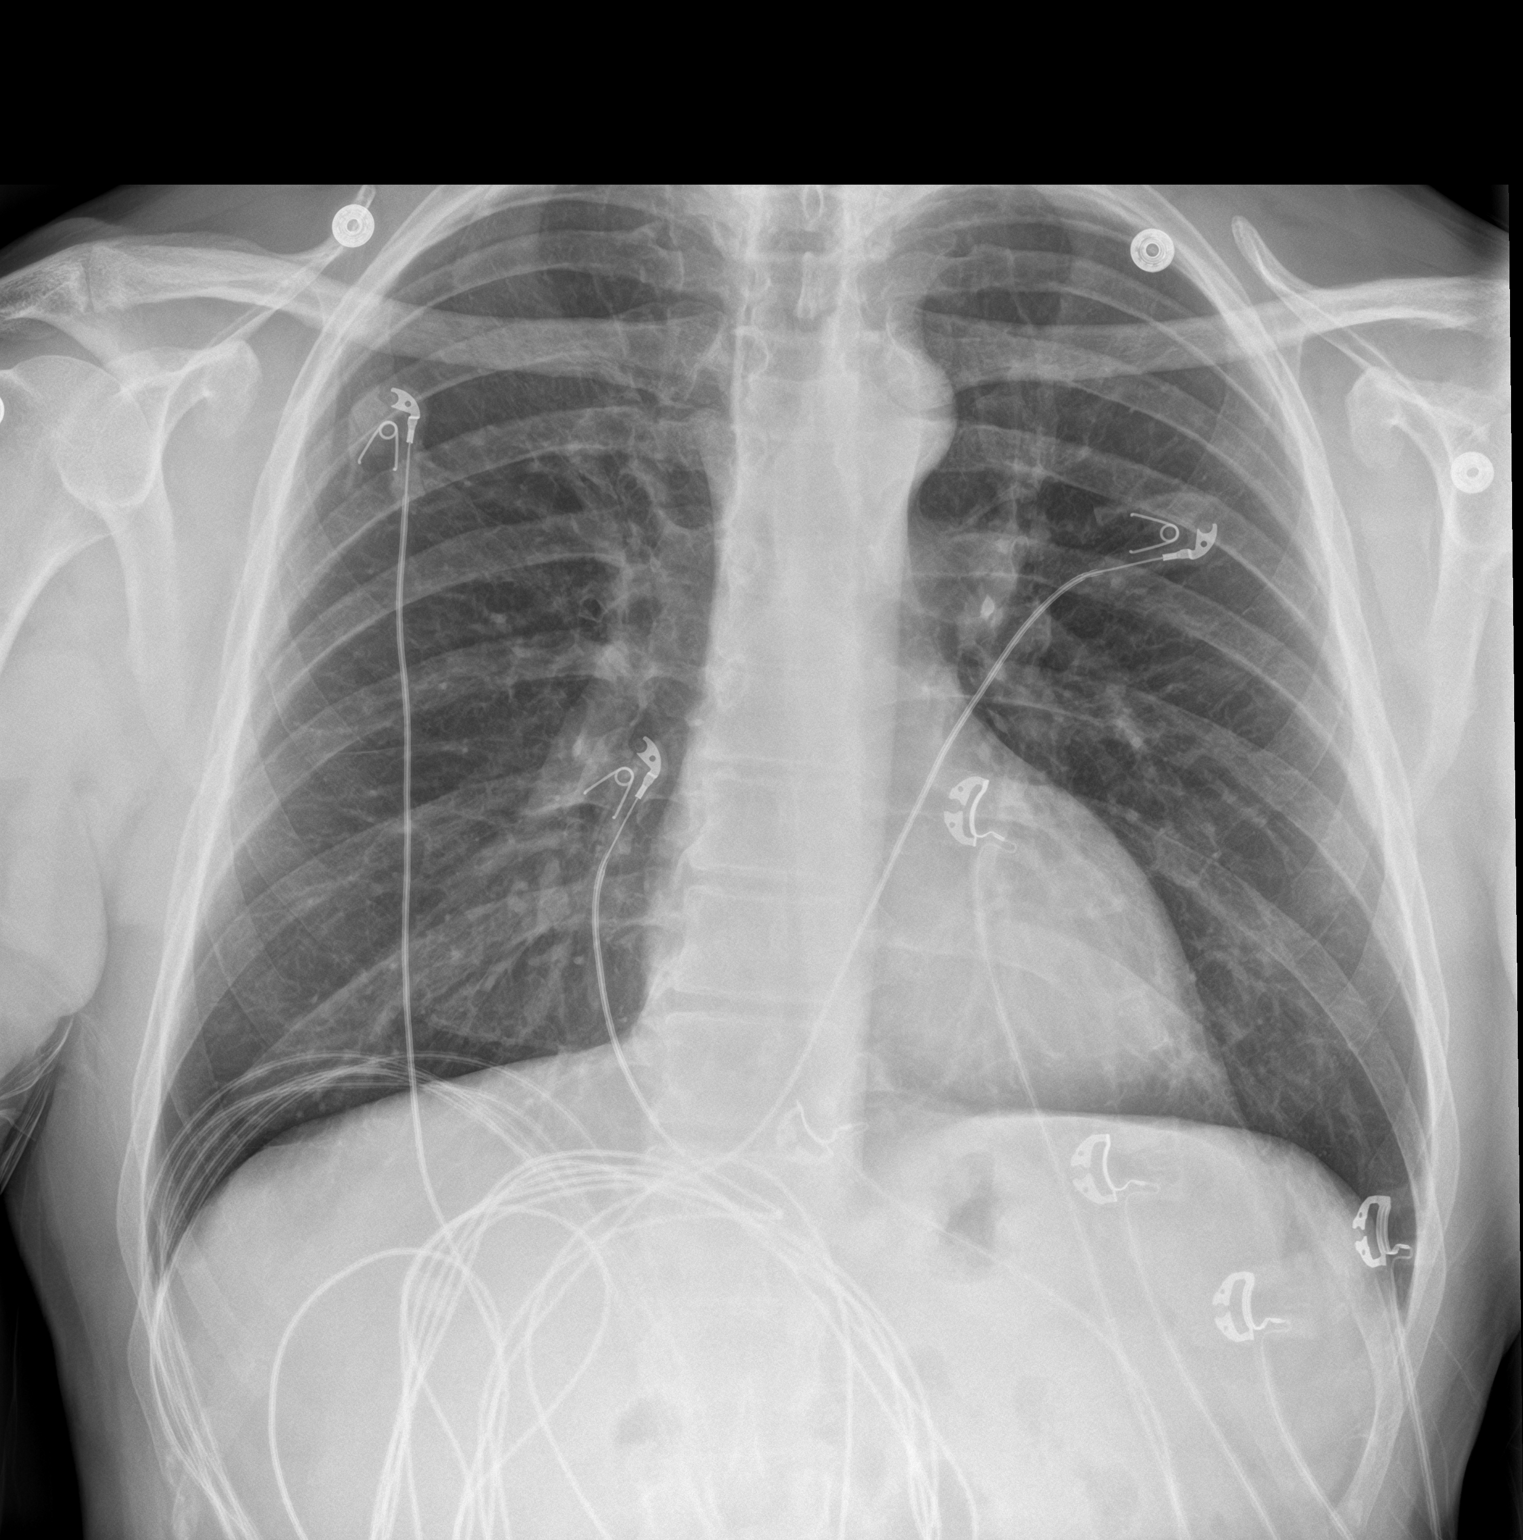

[chest lat]
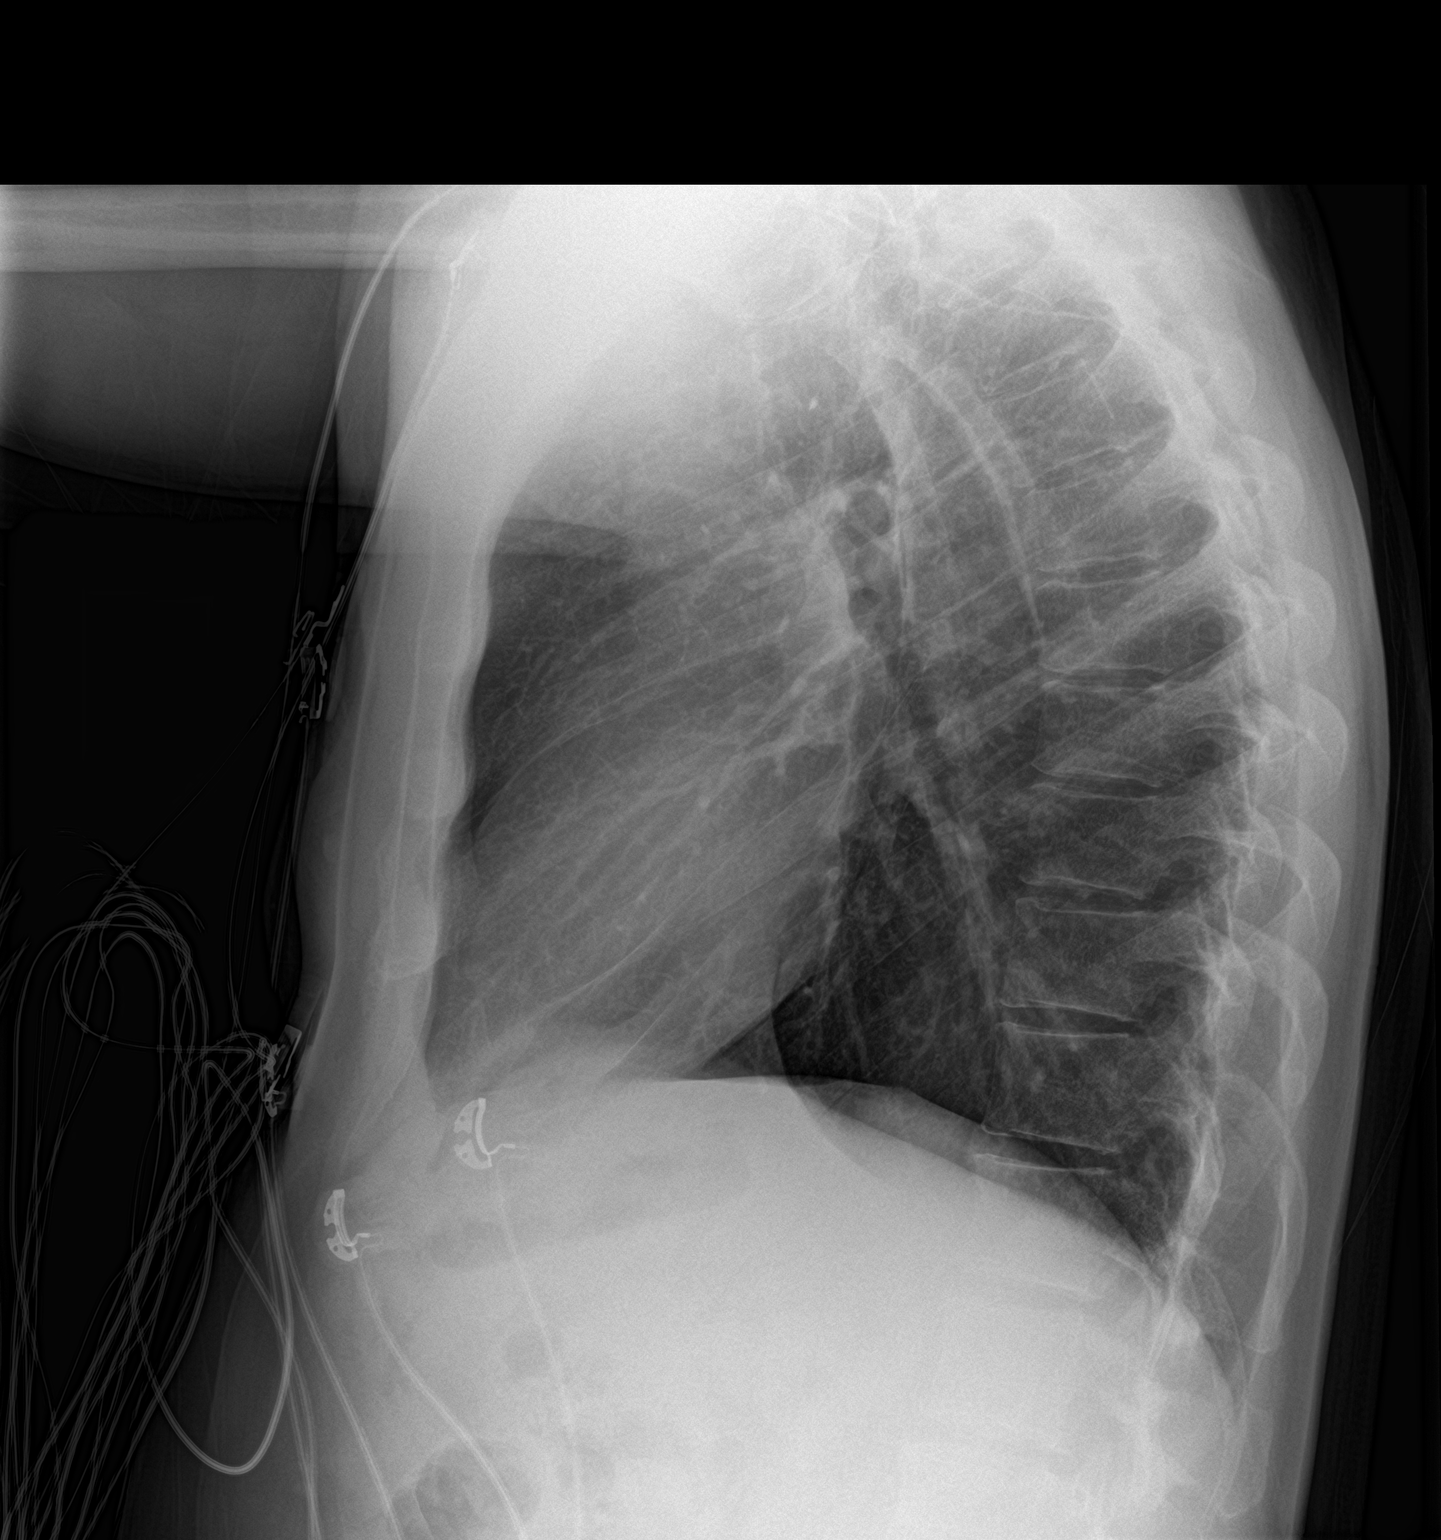

[2 of 2 positions shown; findings below may reference images not displayed]

FINDINGS: Normal heart size, mediastinal contours, and pulmonary vascularity.

Lungs clear.

No acute infiltrate, pleural effusion, or pneumothorax.

Osseous structures unremarkable.
IMPRESSION: Normal exam.

## 2020-03-13 NOTE — Discharge Instructions (Signed)
Continue taking your medicine as prescribed.  Call the GI doctor if needed for rectal bleeding that is concerning.  Continue the medicine that he prescribed, 2 days ago.  Make sure you are getting plenty of rest, drinking a lot of fluids and eating a regular diet.  Follow-up with your cardiologist next week as scheduled and your neurologist and PCP, as needed.

## 2020-03-13 NOTE — ED Provider Notes (Signed)
[facility] EMERGENCY DEPARTMENT Provider Note   CSN: [id number] Arrival date & time: 03/13/20  [id number]     History Chief Complaint  Patient presents with  . Left Side Numbness  . Rectal Bleeding    Logan Bush is a 60 y.o. male.  HPI He presents for evaluation of a spectrum of complaints including pain in left chest, left arm discomfort, left leg pain and numbness, and rectal bleeding.  He noticed the rectal bleeding today.  He saw GI, 2 days ago to be evaluated for stooling problems including frequent stools, which goes intermittently.  He was placed on high-dose comedian, for symptomatic relief and instructed to follow as needed.  This morning he had a formed bowel movement that had blood in it.  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  He saw neurologist, last month for headaches, and was placed on prophylactic medication, topiramate.  He saw cardiology for screening purposes, including vascular disease, and shortness of breath.  He was noted to have had a echocardiogram, last fall that was normal.  He is being followed expectantly by Dr. Jacinto HalimGanji.  Past Medical History:  Diagnosis Date  . Allergy   . DVT (deep venous thrombosis) (HCC)   . Palpitation   . Vertigo     Patient Active Problem List   Diagnosis Date Noted  . Abdominal pain 10/13/2017  . Change in bowel habits 05/12/2017  . RUQ pain 05/12/2017  . Palpitations 03/02/2017  . Osteoarthritis of left knee 03/02/2017  . Polyp of colon 09/16/2016    Past Surgical History:  Procedure Laterality Date  . COLONOSCOPY N/A 08/05/2017   Procedure: COLONOSCOPY;  Surgeon: Corbin Adeourk, Robert M, MD;  [facility];  Service: Endoscopy;  Laterality: N/A;  1:45pm-rescheduled to 7/12 @9 :30am per Darlina RumpfMartina  . POLYPECTOMY  10/2012  . POLYPECTOMY  08/05/2017   Procedure: POLYPECTOMY;  Surgeon: Corbin Adeourk, Robert M, MD;  Location: [facility];  Service: Endoscopy;;       Family History  Problem Relation Age of Onset   . Heart attack Mother   . Hypertension Mother   . Cancer Father        prostate cancer  . Asthma Father   . Cancer Sister        breast cancer  . Cancer Maternal Grandmother        Breast Cancer  . Colon cancer Neg Hx   . Colon polyps Neg Hx     Social History   Tobacco Use  . Smoking status: Passive Smoke Exposure - Never Smoker  . Smokeless tobacco: Never Used  Vaping Use  . Vaping Use: Never used  Substance Use Topics  . Alcohol use: No  . Drug use: No    Home Medications Prior to Admission medications   Medication Sig Start Date End Date Taking? Authorizing Provider  acetaminophen (TYLENOL) 650 MG CR tablet Take 650 mg by mouth every 8 (eight) hours as needed for pain.    Yes [provider]  apixaban (ELIQUIS) 5 MG TABS tablet Take 1 tablet (5 mg total) by mouth 2 (two) times daily. 08/22/19  Yes Lars Mageollins, Emma M, PA-C  hyoscyamine (LEVBID) 0.375 MG 12 hr tablet Take 1 tablet (0.375 mg total) by mouth in the morning. 03/11/20  Yes Rourk, Gerrit Friendsobert M, MD  sulfamethoxazole-trimethoprim (BACTRIM DS) 800-160 MG tablet Take 1 tablet by mouth 2 (two) times daily. Patient not taking: Reported on 03/13/2020 01/30/20   [provider]  RIVAROXABAN (  XARELTO) VTE STARTER PACK (15 & 20 MG TABLETS) Follow package directions: Take one 15mg  tablet by mouth twice a day. On day 22, switch to one 20mg  tablet once a day. Take with food. 08/01/19 08/01/19  10/02/19, MD    Allergies    Aspirin  Review of Systems   Review of Systems  All other systems reviewed and are negative.   Physical Exam Updated Vital Signs BP 129/82   Pulse 70   Temp 98.1 F (36.7 C) (Oral)   Resp 14   Ht 5\' 6"  (1.676 m)   Wt 82.7 kg   SpO2 100%   BMI 29.44 kg/m   Physical Exam Vitals and nursing note reviewed.  Constitutional:      General: He is not in acute distress.    Appearance: He is well-developed and well-nourished. He is not ill-appearing, toxic-appearing or diaphoretic.   HENT:     Head: Normocephalic and atraumatic.     Right Ear: External ear normal.     Left Ear: External ear normal.  Eyes:     Extraocular Movements: EOM normal.     Conjunctiva/sclera: Conjunctivae normal.     Pupils: Pupils are equal, round, and reactive to light.  Neck:     Trachea: Phonation normal.  Cardiovascular:     Rate and Rhythm: Normal rate.  Pulmonary:     Effort: Pulmonary effort is normal.  Chest:     Chest wall: No bony tenderness.  Abdominal:     General: There is no distension.     Palpations: Abdomen is soft.     Tenderness: There is no abdominal tenderness.  Musculoskeletal:        General: No swelling or tenderness. Normal range of motion.     Cervical back: Normal range of motion and neck supple.  Skin:    General: Skin is warm, dry and intact.  Neurological:     Mental Status: He is alert and oriented to person, place, and time.     Cranial Nerves: No cranial nerve deficit.     Sensory: No sensory deficit.     Motor: No abnormal muscle tone.     Coordination: Coordination normal.  Psychiatric:        Mood and Affect: Mood and affect and mood normal.        Behavior: Behavior normal.        Thought Content: Thought content normal.        Judgment: Judgment normal.     ED Results / Procedures / Treatments   Labs (all labs ordered are listed, but only abnormal results are displayed) Labs Reviewed  COMPREHENSIVE METABOLIC PANEL - Abnormal; Notable for the following components:      Result Value   BUN 21 (*)    Total Bilirubin 1.5 (*)    All other components within normal limits  CBC WITH DIFFERENTIAL/PLATELET - Abnormal; Notable for the following components:   WBC 3.8 (*)    All other components within normal limits  POC OCCULT BLOOD, ED - Abnormal; Notable for the following components:   Fecal Occult Bld POSITIVE (*)    All other components within normal limits  TROPONIN I (HIGH SENSITIVITY)  TROPONIN I (HIGH SENSITIVITY)    EKG EKG  Interpretation  Date/Time:  Thursday March 13 2020 06:29:37 EST Ventricular Rate:  64 PR Interval:    QRS Duration: 101 QT Interval:  428 QTC Calculation: 442 R Axis:   -43 Text Interpretation: Sinus rhythm Left axis  deviation Consider anterior infarct No significant change since 07/14/2020 Confirmed by Geoffery Lyons ([id number]) on 03/13/2020 6:43:53 AM   Radiology DG Chest 2 View  Result Date: 03/13/2020 CLINICAL DATA:  Chest pain, LEFT side weakness EXAM: CHEST - 2 VIEW COMPARISON:  07/15/2018 FINDINGS: Normal heart size, mediastinal contours, and pulmonary vascularity. Lungs clear. No acute infiltrate, pleural effusion, or pneumothorax. Osseous structures unremarkable. IMPRESSION: Normal exam. Electronically Signed   By: Ulyses Southward M.D.   On: 03/13/2020 08:33    Procedures Procedures   Medications Ordered in ED Medications - No data to display  ED Course  I have reviewed the triage vital signs and the nursing notes.  Pertinent labs & imaging results that were available during my care of the patient were reviewed by me and considered in my medical decision making (see chart for details).  Clinical Course as of 03/13/20 1003  Thu Mar 13, 2020  0907 He now states that he is having intermittent nightly chest pain, associated with lying on his left side, for 2 weeks.  He denies problems eating.  He states when GI checked his stool, 2 days ago it was negative for blood.  He is worried about "the veins in my right leg," and has appointment with cardiology for checkup on that, next week.  He states that he did not take the topiramate, prescribed by neurology for headache prevention, because of the side effect profile. [EW]  [id number] I discussed the case with Dr. Jena Gauss, who knows the patient well having seen him 2 days ago.  We discussed the complaints, and findings today.  He states that from his perspective no changes should be made for the rectal bleeding which occurred.  He wants the patient  to stay on Levbid, and follow-up with him as needed for bleeding that is concerning [EW]    Clinical Course User Index [EW] Mancel Bale, MD   MDM Rules/Calculators/A&P                           Patient Vitals for the past 24 hrs:  BP Temp Temp src Pulse Resp SpO2 Height Weight  03/13/20 1000 129/82 -- -- 70 14 100 % -- --  03/13/20 0945 -- -- -- 82 11 98 % -- --  03/13/20 0930 129/83 -- -- (!) 57 17 100 % -- --  03/13/20 0915 -- -- -- (!) 56 12 99 % -- --  03/13/20 0900 -- -- -- 66 16 98 % -- --  03/13/20 0845 (!) 123/92 -- -- 66 15 96 % -- --  03/13/20 0815 -- -- -- 78 16 96 % -- --  03/13/20 0800 131/85 -- -- (!) 59 19 95 % -- --  03/13/20 0745 (!) 124/94 -- -- (!) 55 13 97 % -- --  03/13/20 0730 (!) 149/91 -- -- 73 16 98 % -- --  03/13/20 0715 -- -- -- 63 13 95 % -- --  03/13/20 0700 (!) 130/91 -- -- 60 17 95 % -- --  03/13/20 0645 -- -- -- 63 19 94 % -- --  03/13/20 0630 131/87 98.1 F (36.7 C) Oral 62 19 95 % -- --  03/13/20 0620 -- -- -- -- -- -- 5\' 6"  (1.676 m) 82.7 kg    10:00 AM Reevaluation with update and discussion. After initial assessment and treatment, an updated evaluation reveals he remains comfortable has no additional complaints.  Findings discussed with him and all questions  were answered. Mancel Bale   Medical Decision Making:  This patient is presenting for evaluation of nonspecific symptoms including chest pain, and rectal bleeding, which does require a range of treatment options, and is a complaint that involves a moderate risk of morbidity and mortality. The differential diagnoses include ACS, progression of intestinal symptoms, metabolic disorder. I decided to review old records, and in summary middle-aged male presenting with multiple symptoms, not concurrent with a unifying diagnosis.  I did not require additional historical information from anyone.  Clinical Laboratory Tests Ordered, included stool for occult blood, CBC, Metabolic panel and  Troponin. Review indicates normal except blood present on stool testing. Radiologic Tests Ordered, included chest x-ray.  I independently Visualized: Radiograph images, which show no infiltrate or edema  Cardiac Monitor Tracing which shows normal sinus rhythm   Specimen of stool indicating brown color mixed with blood and exudate..   Critical Interventions-clinical evaluation, laboratory testing, chest x-ray, obtain stool for observation and occult blood testing, observation and reevaluation peer  After These Interventions, the Patient was reevaluated and was found stable for discharge.  Rectal bleeding, with bowel irregularities, which are ongoing.  Doubt acute active bleed, which could lead to hemodynamic instability.  He is stable for discharge with outpatient management as currently planned.  CRITICAL CARE-no Performed by: Mancel Bale  Nursing Notes Reviewed/ Care Coordinated Applicable Imaging Reviewed Interpretation of Laboratory Data incorporated into ED treatment  The patient appears reasonably screened and/or stabilized for discharge and I doubt any other medical condition or other [facility] requiring further screening, evaluation, or treatment in the ED at this time prior to discharge.  Plan: Home Medications-continue usual; Home Treatments-rest, fluids, regular diet; return here if the recommended treatment, does not improve the symptoms; Recommended follow up-GI, as needed.  Follow-up with cardiology as scheduled and neurology as needed for headaches or neurologic symptoms.     Final Clinical Impression(s) / ED Diagnoses Final diagnoses:  Rectal bleeding  Chest pain, unspecified type  Nonintractable headache, unspecified chronicity pattern, unspecified headache type  Anticoagulation adequate    Rx / DC Orders ED Discharge Orders    None       Mancel Bale, MD 03/13/20 1004

## 2020-03-13 NOTE — ED Notes (Signed)
Pt ambulated to bathroom with steady gait. Denies feelings of dizziness or lightheadedness.  Pt stool is brown/green in color, soft, stool type 4 (smooth, sausage shape, and soft). Presence of pus and blood is visible on stool. Small amounts of red blood. Hemoccult is positive for blood.

## 2020-03-13 NOTE — ED Triage Notes (Signed)
Pt here pov from home with cc of left side weakness, numbness, and some pain. Said it started around 11pm  Also says he is on Eliquis for DVT and has rectal bleeding from polyp removal.  Also states it feels like his heart is racing.

## 2020-03-17 ENCOUNTER — Other Ambulatory Visit: Payer: Self-pay

## 2020-03-17 ENCOUNTER — Ambulatory Visit: Payer: Self-pay | Admitting: Cardiology

## 2020-03-17 ENCOUNTER — Encounter: Payer: Self-pay | Admitting: Cardiology

## 2020-03-17 VITALS — BP 118/75 | HR 98 | Temp 98.8°F | Resp 17 | Ht 66.0 in | Wt 180.2 lb

## 2020-03-17 DIAGNOSIS — I82890 Acute embolism and thrombosis of other specified veins: Secondary | ICD-10-CM

## 2020-03-17 DIAGNOSIS — R002 Palpitations: Secondary | ICD-10-CM

## 2020-03-17 DIAGNOSIS — R0789 Other chest pain: Secondary | ICD-10-CM

## 2020-03-17 NOTE — Progress Notes (Signed)
Primary Physician/Referring:  Health, [facility]  Patient ID: Logan Bush, male    DOB: [birth year], 60 y.o.   MRN: [id number]  Chief Complaint  Patient presents with  . Shortness of Breath    3 MONTH   HPI:    Logan Bush  is a 60 y.o. gentleman with no significant prior cardiovascular history, had an accidental fall, was seen in the emergency room on 08/01/2019 and was diagnosed with acute superficial thrombosis of the right lower extremity.  Due to recurrent pain, he was evaluated by vascular surgery and repeat lower extremity venous duplex revealed progression of superficial vein thrombosis close to the deep vein.  He was started on Eliquis. He is having a f/u in two days with vascular surgery.  He is presently asymptomatic, I had seen him for dyspnea and palpitations.  States that he still continues to have occasional episodes of palpitations but does return to full activity without any limitations.  He has had resolution of frequency of urination and headache.  Past Medical History:  Diagnosis Date  . Allergy   . DVT (deep venous thrombosis) (HCC)   . Palpitation   . Vertigo    Past Surgical History:  Procedure Laterality Date  . COLONOSCOPY N/A 08/05/2017   Procedure: COLONOSCOPY;  Surgeon: Corbin Ade, MD;  Location: [facility];  Service: Endoscopy;  Laterality: N/A;  1:45pm-rescheduled to 7/12 @9 :30am per  . POLYPECTOMY  10/2012  . POLYPECTOMY  08/05/2017   Procedure: POLYPECTOMY;  Surgeon: 10/06/2017, MD;  Location: [facility];  Service: Endoscopy;;   Family History  Problem Relation Age of Onset  . Heart attack Mother   . Hypertension Mother   . Cancer Father        prostate cancer  . Asthma Father   . Cancer Sister        breast cancer  . Cancer Maternal Grandmother        Breast Cancer  . Colon cancer Neg Hx   . Colon polyps Neg Hx     Social History   Tobacco Use  . Smoking status: Passive Smoke Exposure - Never Smoker  .  Smokeless tobacco: Never Used  Substance Use Topics  . Alcohol use: No   Marital Status: Married  ROS  Review of Systems  Cardiovascular: Positive for palpitations. Negative for chest pain, dyspnea on exertion and leg swelling.  Gastrointestinal: Negative for melena.   Objective  Blood pressure 118/75, pulse 98, temperature 98.8 F (37.1 C), temperature source Temporal, resp. rate 17, height 5\' 6"  (1.676 m), weight 180 lb 3.2 oz (81.7 kg), SpO2 98 %.  Vitals with BMI 03/17/2020 03/13/2020 03/13/2020  Height 5\' 6"  - -  Weight 180 lbs 3 oz - -  BMI 29.1 - -  Systolic 118 121 03/15/2020  Diastolic 75 80 82  Pulse 98 55 70     Physical Exam Cardiovascular:     Rate and Rhythm: Normal rate and regular rhythm.     Pulses: Intact distal pulses.     Heart sounds: Normal heart sounds. No murmur heard. No gallop.      Comments: No leg edema, no JVD. Right medial aspect of the leg and thigh shows superficial thrombophlebitis. No calf tenderness, no other signs of inflammation. Pulmonary:     Effort: Pulmonary effort is normal.     Breath sounds: Normal breath sounds.  Abdominal:     General: Bowel sounds are normal.  Palpations: Abdomen is soft.    Laboratory examination:   Recent Labs    03/13/20 0734  NA 139  K 3.6  CL 106  CO2 27  GLUCOSE 86  BUN 21*  CREATININE 1.04  CALCIUM 9.4  GFRNONAA >60   estimated creatinine clearance is 77.8 mL/min (by C-G formula based on SCr of 1.04 mg/dL).  CMP Latest Ref Rng & Units 03/13/2020 07/15/2018 02/04/2018  Glucose 70 - 99 mg/dL 86 84 675(F)  BUN 6 - 20 mg/dL 16(B) 18 17  Creatinine 0.61 - 1.24 mg/dL 8.46 6.59 9.35  Sodium 135 - 145 mmol/L 139 139 139  Potassium 3.5 - 5.1 mmol/L 3.6 3.6 3.6  Chloride 98 - 111 mmol/L 106 104 105  CO2 22 - 32 mmol/L 27 28 27   Calcium 8.9 - 10.3 mg/dL 9.4 8.9 9.2  Total Protein 6.5 - 8.1 g/dL 6.7 - 7.8  Total Bilirubin 0.3 - 1.2 mg/dL ) - 2.0(H)  Alkaline Phos 38 - 126 U/L 48 - 61  AST 15 - 41  U/L 23 - 21  ALT 0 - 44 U/L 28 - 31   CBC Latest Ref Rng & Units 03/13/2020 07/15/2018 02/04/2018  WBC 4.0 - 10.5 K/uL 3.8(L) 7.9 8.6  Hemoglobin 13.0 - 17.0 g/dL 04/05/2018 77.9 39.0  Hematocrit 39.0 - 52.0 % 43.7 42.3 45.3  Platelets 150 - 400 K/uL 198 204 167    Lipid Panel Recent Labs    01/10/20 0832  CHOL 203*  TRIG 89  LDLCALC 125*  HDL 62    HEMOGLOBIN A1C Lab Results  Component Value Date   HGBA1C 5.2 10/14/2015   MPG 103 10/14/2015   TSH No results for input(s): TSH in the last 8760 hours.  Medications and allergies   Allergies  Allergen Reactions  . Aspirin Other (See Comments)    Makes GI bleeding     Current Outpatient Medications  Medication Instructions  . apixaban (ELIQUIS) 5 mg, Oral, 2 times daily  . hyoscyamine (LEVBID) 0.375 mg, Oral, Every morning  . sulfamethoxazole-trimethoprim (BACTRIM DS) 800-160 MG tablet 1 tablet, Oral, 2 times daily   Radiology:   No results found.  Cardiac Studies:   Lower Extremity Venous Duplex  08/22/2019 right leg: RIGHT:  - There is no evidence of deep vein thrombosis in the lower extremity.   - Superficial thrombosis extending from the calf up to the saphenofemoral  junction. Thrombus is 1.2 cm from the deep venous system. Thrombus has  propagated since last exam 08/08/2019.  Lower Extremity Venous Duplex  10/08/2019:   RIGHT: - Findings consistent with age indeterminate superficial vein thrombosis involving the right great saphenous vein. Thrombus is 1.78 cm from the deep venous system. - Findings appear essentially unchanged or slightly improved compared to previous examination. - There is no evidence of deep vein thrombosis in the lower extremity.  Echocardiogram 09/27/2019: Normal LV systolic function with visual EF 55-60%. Left ventricle cavity is normal in size. Normal global wall motion. Indeterminate diastolic filling pattern, normal LAP. Calculated EF 58%. Left atrial cavity is moderately  dilated. Mild (Grade I) mitral regurgitation.  Mild tricuspid regurgitation. No evidence of pulmonary hypertension. Mild pulmonic regurgitation. No prior study for comparison.  EKG  EKG 03/17/2020: Normal sinus rhythm at rate of 60 bpm, leftward axis, no evidence of ischemia.  Probably normal EKG.   No significant change from 08/28/2019   Assessment     ICD-10-CM   1. Superficial vein thrombosis right GSV extending from the mid  thigh   I82.890   2. Palpitations  R00.2 EKG 12-Lead    Medications Discontinued During This Encounter  Medication Reason  . acetaminophen (TYLENOL) 650 MG CR tablet No longer needed (for PRN medications)    Recommendations:   Logan Bush  is a 60 y.o. gentleman with no significant prior cardiovascular history, seen in the emergency room on 08/01/2019 and was diagnosed with acute superficial thrombosis of the right lower extremity.  Due to recurrent pain, he was evaluated by vascular surgery and repeat lower extremity venous duplex revealed progression of superficial vein thrombosis close to the deep vein.  He was started on Eliquis.  He is presently still being followed by vascular surgery Dr. Myra Gianotti.  He was seen by me for evaluation of palpitations and dyspnea, he has had a normal echocardiogram, today is essentially asymptomatic and except for occasional palpitations.  PACs and PVCs, he has no other symptoms.  I simply reassured him, I will see him back on a as needed basis.  I suspect anticoagulation can now be discontinued but he has an appointment to see vascular surgery soon.  Unless he has recurrence of symptoms of chest pain, worsening palpitations or dyspnea, I will see him back on a as needed basis.    Yates Decamp, MD, [facility] 03/18/2020, 8:33 PM Office: [phone]

## 2020-03-24 ENCOUNTER — Ambulatory Visit (HOSPITAL_COMMUNITY)
Admission: RE | Admit: 2020-03-24 | Discharge: 2020-03-24 | Disposition: A | Discharge status: Home / Self Care | Payer: Self-pay | Source: Ambulatory Visit | Attending: Surgery | Admitting: Surgery

## 2020-03-24 ENCOUNTER — Other Ambulatory Visit: Payer: Self-pay

## 2020-03-24 ENCOUNTER — Ambulatory Visit (INDEPENDENT_AMBULATORY_CARE_PROVIDER_SITE_OTHER): Payer: Self-pay | Admitting: Physician Assistant

## 2020-03-24 ENCOUNTER — Other Ambulatory Visit (HOSPITAL_COMMUNITY): Payer: Self-pay | Admitting: Surgery

## 2020-03-24 VITALS — BP 125/89 | HR 72 | Temp 98.7°F | Resp 20 | Ht 66.0 in | Wt 181.4 lb

## 2020-03-24 DIAGNOSIS — I809 Phlebitis and thrombophlebitis of unspecified site: Secondary | ICD-10-CM

## 2020-03-24 DIAGNOSIS — R52 Pain, unspecified: Secondary | ICD-10-CM | POA: Insufficient documentation

## 2020-03-24 NOTE — Progress Notes (Signed)
[facility]     History of Present Illness  Logan Bush is a 60 y.o. male who presents with chief complaint: swollen leg.  He fell off a ladder in early June and had some pain on the inside of his right leg since then. It became more severe on 08/01/2019 which prompted him going to the emergency department. He states that the vein turned red.    On his ultrasound in July there has been progression of the clot within his saphenous vein up to the mid thigh.  He had several surveillance ultrasounds which showed continued progression of the thrombus up to but not involving the saphenofemoral junction.   He has been on Eliquis since July and we are now at the 6 month point of anticoagulation.     He is here today for follow up he denise increased edema in the right LE.  He exercises daily.  He has now been on Eliquis daily for 6 months.    Past Medical History:  Diagnosis Date  . Allergy   . DVT (deep venous thrombosis) (HCC)   . Palpitation   . Vertigo     Past Surgical History:  Procedure Laterality Date  . COLONOSCOPY N/A 08/05/2017   Procedure: COLONOSCOPY;  Surgeon: Corbin Ade, MD;  Location: [facility];  Service: Endoscopy;  Laterality: N/A;  1:45pm-rescheduled to 7/12 @9 :30am per  . POLYPECTOMY  10/2012  . POLYPECTOMY  08/05/2017   Procedure: POLYPECTOMY;  Surgeon: 10/06/2017, MD;  Location: [facility];  Service: Endoscopy;;    Social History   Socioeconomic History  . Marital status: Married    Spouse name: Not on file  . Number of children: 3  . Years of education: Not on file  . Highest education level: Not on file  Occupational History  . Not on file  Tobacco Use  . Smoking status: Passive Smoke Exposure - Never Smoker  . Smokeless tobacco: Never Used  Vaping Use  . Vaping Use: Never used  Substance and Sexual Activity  . Alcohol use: No  . Drug use: No  . Sexual activity: Not on file  Other Topics Concern  .  Not on file  Social History Narrative   Right handed   Social Determinants of Health   Financial Resource Strain: Not on file  Food Insecurity: Not on file  Transportation Needs: Not on file  Physical Activity: Not on file  Stress: Not on file  Social Connections: Not on file  Intimate Partner Violence: Not on file    Family History  Problem Relation Age of Onset  . Heart attack Mother   . Hypertension Mother   . Cancer Father        prostate cancer  . Asthma Father   . Cancer Sister        breast cancer  . Cancer Maternal Grandmother        Breast Cancer  . Colon cancer Neg Hx   . Colon polyps Neg Hx     Current Outpatient Medications on File Prior to Visit  Medication Sig Dispense Refill  . apixaban (ELIQUIS) 5 MG TABS tablet Take 1 tablet (5 mg total) by mouth 2 (two) times daily. 60 tablet 1  . hyoscyamine (LEVBID) 0.375 MG 12 hr tablet Take 1 tablet (0.375 mg total) by mouth in the morning. 30 tablet 11  . sulfamethoxazole-trimethoprim (BACTRIM DS) 800-160 MG tablet Take 1 tablet by mouth 2 (two) times  daily. (Patient not taking: Reported on 03/24/2020)    . [DISCONTINUED] RIVAROXABAN (XARELTO) VTE STARTER PACK (15 & 20 MG TABLETS) Follow package directions: Take one 15mg  tablet by mouth twice a day. On day 22, switch to one 20mg  tablet once a day. Take with food. 51 each 0   No current facility-administered medications on file prior to visit.    Allergies as of 03/24/2020 - Review Complete 03/24/2020  Allergen Reaction Noted  . Aspirin Other (See Comments) 10/14/2015     ROS:   General:  No weight loss, Fever, chills  HEENT: No recent headaches, no nasal bleeding, no visual changes, no sore throat  Neurologic: No dizziness, blackouts, seizures. No recent symptoms of stroke or mini- stroke. No recent episodes of slurred speech, or temporary blindness.  Cardiac: No recent episodes of chest pain/pressure, no shortness of breath at rest.  No shortness of breath  with exertion.  Denies history of atrial fibrillation or irregular heartbeat  Vascular: No history of rest pain in feet.  No history of claudication.  No history of non-healing ulcer, No history of DVT   Pulmonary: No home oxygen, no productive cough, no hemoptysis,  No asthma or wheezing  Musculoskeletal:  [ ]  Arthritis, [ ]  Low back pain,  [ ]  Joint pain  Hematologic:No history of hypercoagulable state.  No history of easy bleeding.  No history of anemia  Gastrointestinal: No hematochezia or melena,  No gastroesophageal reflux, no trouble swallowing  Urinary: [ ]  chronic Kidney disease, [ ]  on HD - [ ]  MWF or [ ]  TTHS, [ ]  Burning with urination, [ ]  Frequent urination, [ ]  Difficulty urinating;   Skin: No rashes  Psychological: No history of anxiety,  No history of depression  Physical Examination  Vitals:   03/24/20 0855  BP: 125/89  Pulse: 72  Resp: 20  Temp: 98.7 F (37.1 C)  TempSrc: Temporal  SpO2: 95%  Weight: 181 lb 6.4 oz (82.3 kg)  Height: 5\' 6"  (1.676 m)    Body mass index is 29.28 kg/m.  General:  Alert and oriented, no acute distress HEENT: Normal Neck: No bruit or JVD Pulmonary: Clear to auscultation bilaterally Cardiac: Regular Rate and Rhythm without murmur Abdomen: Soft, non-tender, non-distended, no mass, no scars Skin: No rash Extremity Pulses:  2+ radial, brachial, femoral, dorsalis pedis, posterior tibial pulses bilaterally Musculoskeletal: No deformity or edema  Neurologic: Upper and lower extremity motor 5/5 and symmetric  DATA: +---------+--------------------+---------+-----------+----------+----------  ----+  RIGHT  Compressibility   PhasicitySpontaneityPropertiesThrombus  Aging  +---------+--------------------+---------+-----------+----------+----------  ----+  CFV   Pt unable to                                   tolerate comp                               +---------+--------------------+---------+-----------+----------+----------  ----+  SFJ   Pt unable to                                   tolerate comp                              +---------+--------------------+---------+-----------+----------+----------  ----+  FV Prox Full                                  +---------+--------------------+---------+-----------+----------+----------  ----+  FV Mid  Full                                  +---------+--------------------+---------+-----------+----------+----------  ----+  FV DistalFull                                  +---------+--------------------+---------+-----------+----------+----------  ----+  POP   Full                                  +---------+--------------------+---------+-----------+----------+----------  ----+  PTV   Full                                  +---------+--------------------+---------+-----------+----------+----------  ----+  PERO   Full                                  +---------+--------------------+---------+-----------+----------+----------  ----+  GSV   Partial                                 +---------+--------------------+---------+-----------+----------+----------  ----+  SSV   Full                                  +---------+--------------------+---------+-----------+----------+----------  ----+    Summary:  RIGHT:  - There is no evidence of deep vein thrombosis in the lower extremity.  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  -The  common femoral vein appears patent by color flow Doppler; patient  unable to tolerate compression maneuvers.     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  The common femoral vein appears patent by color flow Doppler  Plan: His findings are unchanged without envolvement in the deep system.  He will stop his Eliquis.  He will be referred to Hematology for work up.  Pending what they find will decide if he needs life long anticoagulation or not.  He will f/u with [place] PRN.     Mosetta Pigeon PA-C [facility] Office: [phone]  MD in clinic [place]

## 2020-03-25 ENCOUNTER — Ambulatory Visit: Payer: Self-pay | Admitting: Neurology

## 2020-03-27 ENCOUNTER — Other Ambulatory Visit: Payer: Self-pay

## 2020-03-27 ENCOUNTER — Inpatient Hospital Stay (HOSPITAL_COMMUNITY): Payer: Self-pay | Attending: Hematology | Admitting: Hematology

## 2020-03-27 VITALS — BP 108/79 | HR 76 | Temp 97.0°F | Resp 18 | Ht 67.0 in | Wt 177.5 lb

## 2020-03-27 DIAGNOSIS — R197 Diarrhea, unspecified: Secondary | ICD-10-CM | POA: Insufficient documentation

## 2020-03-27 DIAGNOSIS — R202 Paresthesia of skin: Secondary | ICD-10-CM | POA: Insufficient documentation

## 2020-03-27 DIAGNOSIS — K5900 Constipation, unspecified: Secondary | ICD-10-CM | POA: Insufficient documentation

## 2020-03-27 DIAGNOSIS — R61 Generalized hyperhidrosis: Secondary | ICD-10-CM | POA: Insufficient documentation

## 2020-03-27 DIAGNOSIS — Z7901 Long term (current) use of anticoagulants: Secondary | ICD-10-CM | POA: Insufficient documentation

## 2020-03-27 DIAGNOSIS — Z86718 Personal history of other venous thrombosis and embolism: Secondary | ICD-10-CM | POA: Insufficient documentation

## 2020-03-27 DIAGNOSIS — R110 Nausea: Secondary | ICD-10-CM | POA: Insufficient documentation

## 2020-03-27 DIAGNOSIS — I8001 Phlebitis and thrombophlebitis of superficial vessels of right lower extremity: Secondary | ICD-10-CM

## 2020-03-27 DIAGNOSIS — Z803 Family history of malignant neoplasm of breast: Secondary | ICD-10-CM | POA: Insufficient documentation

## 2020-03-27 NOTE — Progress Notes (Addendum)
CONSULT NOTE  Patient Care Team: Health, [facility] as PCP - General Rourk, Gerrit Friends, MD as Consulting Physician (Gastroenterology) Yates Decamp, MD as Consulting Physician (Cardiology)  CHIEF COMPLAINTS/PURPOSE OF CONSULTATION:  Coagulopathy workup due to extensive superficial venous thrombus  HISTORY OF PRESENTING ILLNESS:  Logan Bush 60 y.o. male was referred here by vascular surgeon for coagulopathy evaluation due to extensive superficial venous thrombus. History supplemented by referring provider's note.  Patient reports that in June 2021 he fell off of a ladder and twisted his ankle. He noted pain on the inside of his right leg, and that the vein in his right calf turned red and felt hard and painful.  He went to the ED in July 2021, and was found to have right lower extremity thrombophlebitis with clot in right saphenous vein. He was not initially started on anticoagulation due to history of rectal bleeding from hemorrhoids, but followed with vascular surgery to monitor progression of clot. Serial [place] on 08/22/19 showed progression of clot into great saphenous vein,extending to the saphenofemoral junction 1.2 cm from the deep venous system.  At that time, vascular team discussed both anticoagulation and IVC filter with the patient, who opted to try anticoagulation first.  Patient was placed on Eliquis, and has completed his 6 month regimen. Repeat venous US performed on 03/24/20 showed unchanged superficial venous clot, and therefore Eliquis was discontinued by vascular service (last dose taken on 03/24/20).  Patient has been sent to hematology office to see if there is an underlying coagulopathy that predisposed him to such an extensive clot.  During this visit, Mr. Soltis reports that he is doing relatively well. No significant lower extremity edema. He continues to have intermittent pain of his right calf and medial right thigh, particularly with palpation and when he lays down to  sleep at night. Reports bilateral paresthesias described as "ants crawling on his skin."  Reports occasional night sweats, but denies chills, fevers, weight loss, poor appetite, and general malaise. Complains of chronic fatigue and mild DOE since 2019, prior to blood clot. No new shortness of breath or chest pain.  ROS was positive for intermittent diarrhea, constipation, and nausea - has had extensive GI workup and is thought to have some element of IBS.  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 However, his mother had 6 miscarriages (as well as 4 living children). No personal history of cancer. His father had prostate cancer. His sister and meternal grandmother had breast cancer. He works as a Gaffer.   MEDICAL HISTORY:  Past Medical History:  Diagnosis Date  . Allergy   . DVT (deep venous thrombosis) (HCC)   . Palpitation   . Vertigo     SURGICAL HISTORY: Past Surgical History:  Procedure Laterality Date  . COLONOSCOPY N/A 08/05/2017   Procedure: COLONOSCOPY;  Surgeon: Corbin Ade, MD;  Location[facility];  Service: Endoscopy;  Laterality: N/A;  1:45pm-rescheduled to 7/12 :30am per Darlina Rumpf  . POLYPECTOMY  10/2012  . POLYPECTOMY  08/05/2017   Procedure: POLYPECTOMY;  Surgeon: Corbin Ade, MD;  Location: [facility];  Service: Endoscopy;;    SOCIAL HISTORY: Social History   Socioeconomic History  . Marital status: Married    Spouse name: Not on file  . Number of children: 3  . Years of education: Not on file  .  Highest education level: Not on file  Occupational History  . Not on file  Tobacco Use  . Smoking status: Passive Smoke Exposure - Never Smoker  . Smokeless tobacco: Never Used  Vaping Use  . Vaping Use: Never used  Substance and Sexual Activity  . Alcohol use: No  . Drug use: No  . Sexual activity: Not on file   Other Topics Concern  . Not on file  Social History Narrative   Right handed   Social Determinants of Health   Financial Resource Strain: Not on file  Food Insecurity: Not on file  Transportation Needs: Not on file  Physical Activity: Not on file  Stress: Not on file  Social Connections: Not on file  Intimate Partner Violence: Not on file    FAMILY HISTORY: Family History  Problem Relation Age of Onset  . Heart attack Mother   . Hypertension Mother   . Cancer Father        prostate cancer  . Asthma Father   . Cancer Sister        breast cancer  . Cancer Maternal Grandmother        Breast Cancer  . Colon cancer Neg Hx   . Colon polyps Neg Hx     ALLERGIES:  is allergic to aspirin.  MEDICATIONS:  Current Outpatient Medications  Medication Sig Dispense Refill  . apixaban (ELIQUIS) 5 MG TABS tablet Take 1 tablet (5 mg total) by mouth 2 (two) times daily. 60 tablet 1  . hyoscyamine (LEVBID) 0.375 MG 12 hr tablet Take 1 tablet (0.375 mg total) by mouth in the morning. 30 tablet 11  . sulfamethoxazole-trimethoprim (BACTRIM DS) 800-160 MG tablet Take 1 tablet by mouth 2 (two) times daily.     No current facility-administered medications for this visit.    REVIEW OF SYSTEMS:   Constitutional: Denies fevers, chills, unintentional weight loss.  Occasional night sweats Eyes: Denies blurriness of vision, double vision or watery eyes Ears, nose, mouth, throat, and face: Denies mucositis or sore throat Respiratory: Denies cough, dyspnea or wheezes Cardiovascular: Denies palpitation, chest discomfort or lower extremity swelling Gastrointestinal:  Denies nausea, heartburn or change in bowel habits. Chronic alternating diarrhea and constipation. Skin: Denies abnormal skin rashes Lymphatics: Denies new lymphadenopathy or easy bruising Neurological:Denies numbness and new weakness. Nocturnal paresthesia of bilateral lower extremity. Behavioral/Psych: Mood is stable, no new changes   All other systems were reviewed with the patient and are negative.  PHYSICAL EXAMINATION: ECOG PERFORMANCE STATUS: 1 - Symptomatic but completely ambulatory  Vitals:   03/27/20 1442  BP: 108/79  Pulse: 76  Resp: 18  Temp: (!) 97 F (36.1 C)  SpO2: 97%   Filed Weights   03/27/20 1442  Weight: 177 lb 7.5 oz (80.5 kg)    GENERAL:alert, no distress and comfortable SKIN: skin color, texture, turgor are normal, no rashes or significant lesions. Varicose veins of bilateral ankle and right medial thigh. EYES: normal, conjunctiva are pink and non-injected, sclera clear OROPHARYNX:no exudate, no erythema and lips, buccal mucosa, and tongue normal  NECK: supple, thyroid normal size, non-tender, without nodularity LYMPH:  no palpable lymphadenopathy in the cervical, axillary or inguinal LUNGS: clear to auscultation and percussion with normal breathing effort HEART: regular rate & rhythm and no murmurs and no lower extremity edema ABDOMEN:abdomen soft, non-tender and normal bowel sounds MUSCULOSKELETAL:no cyanosis of digits and no clubbing  PSYCH: alert & oriented x 3 with fluent speech NEURO: no focal motor/sensory deficits  LABORATORY DATA:  I have reviewed the data as listed Recent Results (from the past 2160 hour(s))  Lipid Panel With LDL/HDL Ratio     Status: Abnormal   Collection Time: 01/10/20  8:32 AM  Result Value Ref Range   Cholesterol, Total 203 (H) 100 - 199 mg/dL   Triglycerides 89 0 - 149 mg/dL   HDL 62 >79 mg/dL   VLDL Cholesterol Cal 16 5 - 40 mg/dL   LDL Chol Calc (NIH) 892 (H) 0 - 99 mg/dL   LDL/HDL Ratio 2.0 0.0 - 3.6 ratio    Comment:                                     LDL/HDL Ratio                                             Men  Women                               1/2 Avg.Risk  1.0    1.5                                   Avg.Risk  3.6    3.2                                2X Avg.Risk  6.2    5.0                                3X Avg.Risk  8.0    6.1    Urine Culture     Status: None   Collection Time: 01/10/20  8:33 AM   Specimen: Urine   UR  Result Value Ref Range   Urine Culture, Routine Final report    Organism ID, Bacteria Lactobacillus species     Comment: 10,000-25,000 colony forming units per mL Susceptibility not normally performed on this organism.   Urinalysis, Routine w reflex microscopic     Status: None   Collection Time: 01/10/20  8:34 AM  Result Value Ref Range   Specific Gravity, UA 1.009 1.005 - 1.030   pH, UA 6.5 5.0 - 7.5   Color, UA Yellow Yellow   Appearance Ur Clear Clear   Leukocytes,UA Negative Negative   Protein,UA Negative Negative/Trace   Glucose, UA Negative Negative   Ketones, UA Negative Negative   RBC, UA Negative Negative   Bilirubin, UA Negative Negative   Urobilinogen, Ur 0.2 0.2 - 1.0 mg/dL   Nitrite, UA Negative Negative   Microscopic Examination Comment     Comment: Microscopic not indicated and not performed.  POC occult blood, ED     Status: Abnormal   Collection Time: 03/13/20  7:33 AM  Result Value Ref Range   Fecal Occult Bld POSITIVE (A) NEGATIVE  Comprehensive metabolic panel     Status: Abnormal   Collection Time: 03/13/20  7:34 AM  Result Value Ref Range   Sodium 139 135 - 145 mmol/L   Potassium 3.6 3.5 - 5.1 mmol/L   Chloride 106 98 -  111 mmol/L   CO2 27 22 - 32 mmol/L   Glucose, Bld 86 70 - 99 mg/dL    Comment: Glucose reference range applies only to samples taken after fasting for at least 8 hours.   BUN 21 (H) 6 - 20 mg/dL   Creatinine, Ser 1.61 0.61 - 1.24 mg/dL   Calcium 9.4 8.9 - 09.6 mg/dL   Total Protein 6.7 6.5 - 8.1 g/dL   Albumin 4.3 3.5 - 5.0 g/dL   AST 23 15 - 41 U/L   ALT 28 0 - 44 U/L   Alkaline Phosphatase 48 38 - 126 U/L   Total Bilirubin 1.5 (H) 0.3 - 1.2 mg/dL   GFR, Estimated >04 >54 mL/min    Comment: (NOTE) Calculated using the CKD-EPI Creatinine Equation (2021)    Anion gap 6 5 - 15    Comment: Performed at [facility], [address], [place], [place] [address]  CBC with Differential     Status: Abnormal   Collection Time: 03/13/20  7:34 AM  Result Value Ref Range   WBC 3.8 (L) 4.0 - 10.5 K/uL   RBC 4.77 4.22 - 5.81 MIL/uL   Hemoglobin 14.5 13.0 - 17.0 g/dL   HCT 91.4 78.2 - 95.6 %   MCV 91.6 80.0 - 100.0 fL   MCH 30.4 26.0 - 34.0 pg   MCHC 33.2 30.0 - 36.0 g/dL   RDW 21.3 08.6 - 57.8 %   Platelets 198 150 - 400 K/uL   nRBC 0.0 0.0 - 0.2 %   Neutrophils Relative % 53 %   Neutro Abs 2.0 1.7 - 7.7 K/uL   Lymphocytes Relative 32 %   Lymphs Abs 1.2 0.7 - 4.0 K/uL   Monocytes Relative 10 %   Monocytes Absolute 0.4 0.1 - 1.0 K/uL   Eosinophils Relative 4 %   Eosinophils Absolute 0.2 0.0 - 0.5 K/uL   Basophils Relative 1 %   Basophils Absolute 0.0 0.0 - 0.1 K/uL   Immature Granulocytes 0 %   Abs Immature Granulocytes 0.01 0.00 - 0.07 K/uL    Comment: Performed at [facility], [address] [place], [place] [address]  Troponin I (High Sensitivity)     Status: None   Collection Time: 03/13/20  7:34 AM  Result Value Ref Range   Troponin I (High Sensitivity) 3 <18 ng/L    Comment: (NOTE) Elevated high sensitivity troponin I (hsTnI) values and significant  changes across serial measurements may suggest ACS but many other  chronic and acute conditions are known to elevate hsTnI results.  Refer to the "Links" section for chest pain algorithms and additional  guidance. Performed at [facility], [address] [place], [place] [address]   Troponin I (High Sensitivity)     Status: None   Collection Time: 03/13/20  9:25 AM  Result Value Ref Range   Troponin I (High Sensitivity) 3 <18 ng/L    Comment: (NOTE) Elevated high sensitivity troponin I (hsTnI) values and significant  changes across serial measurements may suggest ACS but many other  chronic and acute conditions are known to elevate hsTnI results.  Refer to the "Links" section for chest pain algorithms and additional  guidance. Performed at [facility], [address] [place], [place] [address]     RADIOGRAPHIC STUDIES: I have personally reviewed the radiological images as listed and agreed with the findings in the report. DG Chest 2 View  Result Date: 03/13/2020 CLINICAL DATA:  Chest pain, LEFT side weakness  EXAM: CHEST - 2 VIEW COMPARISON:  07/15/2018 FINDINGS: Normal heart size, mediastinal contours, and pulmonary vascularity. Lungs clear. No acute infiltrate, pleural effusion, or pneumothorax. Osseous structures unremarkable. IMPRESSION: Normal exam. Electronically Signed   By: Ulyses Southward M.D.   On: 03/13/2020 08:33   VAS [place] LOWER EXTREMITY VENOUS (DVT)  Result Date: 03/24/2020  Lower Venous DVT Study Indications: Evaluation of previously diagnosed right great saphenous vein thrombus.  Anticoagulation: Eliquis. Comparison Study: 10/08/2019:                   Right SVT 1.2 cm from SFJ Performing Technologist: Dorthula Matas RVS, RCS  Examination Guidelines: A complete evaluation includes B-mode imaging, spectral Doppler, color Doppler, and power Doppler as needed of all accessible portions of each vessel. Bilateral testing is considered an integral part of a complete examination. Limited examinations for reoccurring indications may be performed as noted. The reflux portion of the exam is performed with the patient in reverse Trendelenburg.  +---------+--------------------+---------+-----------+----------+--------------+ RIGHT    Compressibility     PhasicitySpontaneityPropertiesThrombus Aging +---------+--------------------+---------+-----------+----------+--------------+ CFV      Pt unable to                                                              tolerate comp                                                    +---------+--------------------+---------+-----------+----------+--------------+ SFJ      Pt unable to                                                              tolerate comp                                                     +---------+--------------------+---------+-----------+----------+--------------+ FV Prox  Full                                                             +---------+--------------------+---------+-----------+----------+--------------+ FV Mid   Full                                                             +---------+--------------------+---------+-----------+----------+--------------+ FV DistalFull                                                             +---------+--------------------+---------+-----------+----------+--------------+  POP      Full                                                             +---------+--------------------+---------+-----------+----------+--------------+ PTV      Full                                                             +---------+--------------------+---------+-----------+----------+--------------+ PERO     Full                                                             +---------+--------------------+---------+-----------+----------+--------------+ GSV      Partial                                                          +---------+--------------------+---------+-----------+----------+--------------+ SSV      Full                                                             +---------+--------------------+---------+-----------+----------+--------------+     Summary: RIGHT: - There is no evidence of deep vein thrombosis in the lower extremity. - Age indeterminate thrombus involving the great saphenous vein in the proximal thigh and throughout of the calf. -The common femoral vein appears patent by color flow Doppler; patient unable to tolerate compression maneuvers.   *See table(s) above for measurements and observations. Electronically signed by Coral ElseVance Brabham MD on 03/24/2020 at 11:54:15 AM.    Final     ASSESSMENT: 1. Superficial venous thrombus of right great saphenous  vein -Initial presentation in June 2021 after experiencing RLE pain and redness following fall from ladder -Found to have right lower extremity thrombophlebitis with clot in right saphenous vein in June 2021 -Serial ultrasound on 08/22/2019 showed progression of clot in the great saphenous vein extending to the saphenofemoral junction 1.2 cm from deep venous system -Placed on anticoagulation by vascular surgeon, completed 661-month course of Eliquis on 03/24/2020 -Repeat venous ultrasound performed on 03/24/2020 showed thrombus involving the great saphenous vein in the proximal thigh and throughout the calf. -Referred to hematology to evaluate potential underlying coagulopathy that predisposed him to extensive clot -Tolerated Eliquis well, despite some history of hemorrhoidal bleeding  2.  Family/social history -He has no personal or family history of blood clots or known coagulopathies. -However, his mother had 6 miscarriages (as well as 4 living children). -No personal history of cancer. His father had prostate cancer. His sister and meternal grandmother had breast cancer. -He works as a Gafferhandyman.   PLAN:  1. Superficial venous  thrombus of right great saphenous vein -May have an underlying coagulopathy due to the massive extension of his superficial venous thrombus, as well as the failure of this thrombus to significantly recede while on Eliquis. -No known family history of blood clots or coagulopathy, but mother had 6 miscarriages -In order to help stratify patient's risk of recurrent VTE or other clotting event will order coagulopathy panels to be completed in 4 weeks, to allow sufficient time for apixaban to leave the patient system for obtaining results. -Will obtain repeat venous duplex of right lower extremity to make sure that there has been no clot progression after Eliquis was discontinued -If significant coagulopathy and risk of clotting is discovered, patient may need lifelong  anticoagulation -Patient educated on alarm symptoms, instructed to call our office immediately if he has any increasing pain, redness, or swelling of his right lower extremity.  Instructed to present to the emergency department with any new chest pain or shortness of breath.   PLAN SUMMARY: -Labs in 4 weeks (D-dimer, factor V Leiden, [id number] mutation, antibeta-2 glycoprotein 1 cardiolipin, lupus anticoagulant, protein C, protein S, Antithrombin III) -Repeat venous duplex right lower extremity in 4 weeks -RTC in 5 weeks   I, [employer], saw this patient in conjunction with Dr. Doreatha Massed. Greater than 50% of the visit was performed by Dr. Ellin Saba.  All questions were answered. The patient knows to call the clinic with any problems, questions or concerns.  I spent 40 minutes counseling the patient face to face. The total time spent in the appointment was 60 minutes and more than 50% was on counseling.     Carnella Guadalajara, PA-C 03/27/20 4:13 PM

## 2020-04-17 ENCOUNTER — Telehealth: Payer: Self-pay | Admitting: *Deleted

## 2020-04-17 NOTE — Telephone Encounter (Signed)
Spoke with pt. Hyoscyamine isn't a medication pt needs to d/c. Pt can hold back on the days he has constipation.

## 2020-04-17 NOTE — Telephone Encounter (Signed)
Pt says he stopped Eliquis because his vein doctor told him to.  He said that he has blood work 04/28/2020.  Wants to know if he needs to stop medicine (hyoscyamine)  that we put him on as well.  [phone]

## 2020-04-28 ENCOUNTER — Ambulatory Visit (HOSPITAL_COMMUNITY)
Admission: RE | Admit: 2020-04-28 | Discharge: 2020-04-28 | Disposition: A | Discharge status: Home / Self Care | Payer: Self-pay | Source: Ambulatory Visit | Attending: Hematology | Admitting: Hematology

## 2020-04-28 ENCOUNTER — Inpatient Hospital Stay (HOSPITAL_COMMUNITY): Payer: Self-pay | Attending: Hematology

## 2020-04-28 ENCOUNTER — Other Ambulatory Visit: Payer: Self-pay

## 2020-04-28 DIAGNOSIS — R002 Palpitations: Secondary | ICD-10-CM | POA: Insufficient documentation

## 2020-04-28 DIAGNOSIS — D6852 Prothrombin gene mutation: Secondary | ICD-10-CM | POA: Insufficient documentation

## 2020-04-28 DIAGNOSIS — I8001 Phlebitis and thrombophlebitis of superficial vessels of right lower extremity: Secondary | ICD-10-CM

## 2020-04-28 DIAGNOSIS — Z79899 Other long term (current) drug therapy: Secondary | ICD-10-CM | POA: Insufficient documentation

## 2020-04-28 DIAGNOSIS — M79651 Pain in right thigh: Secondary | ICD-10-CM | POA: Insufficient documentation

## 2020-04-28 DIAGNOSIS — Z86718 Personal history of other venous thrombosis and embolism: Secondary | ICD-10-CM | POA: Insufficient documentation

## 2020-04-28 DIAGNOSIS — Z8042 Family history of malignant neoplasm of prostate: Secondary | ICD-10-CM | POA: Insufficient documentation

## 2020-04-28 DIAGNOSIS — R079 Chest pain, unspecified: Secondary | ICD-10-CM | POA: Insufficient documentation

## 2020-04-28 DIAGNOSIS — Z803 Family history of malignant neoplasm of breast: Secondary | ICD-10-CM | POA: Insufficient documentation

## 2020-04-28 DIAGNOSIS — R7989 Other specified abnormal findings of blood chemistry: Secondary | ICD-10-CM | POA: Insufficient documentation

## 2020-04-28 DIAGNOSIS — L298 Other pruritus: Secondary | ICD-10-CM | POA: Insufficient documentation

## 2020-04-28 LAB — ANTITHROMBIN III: AntiThromb III Func: 106 % (ref 75–120)

## 2020-04-28 LAB — D-DIMER, QUANTITATIVE: D-Dimer, Quant: 0.6 ug/mL-FEU — ABNORMAL HIGH (ref 0.00–0.50)

## 2020-04-28 IMAGING — US US EXTREM LOW VENOUS*R*
1 series · 14 of 24 positions shown · non-contrast
Comparison: [DATE]

CLINICAL DATA: History of right lower extremity DVT

EXAM:
RIGHT LOWER EXTREMITY VENOUS DOPPLER ULTRASOUND
TECHNIQUE: Gray-scale sonography with compression, as well as color and duplex
ultrasound, were performed to evaluate the deep venous system(s)
from the level of the common femoral vein through the popliteal and
proximal calf veins.

[Series 1: us venous img lower uni right (dvt) · portal-venous · 14 of 37 slices shown]
[im 1/37]
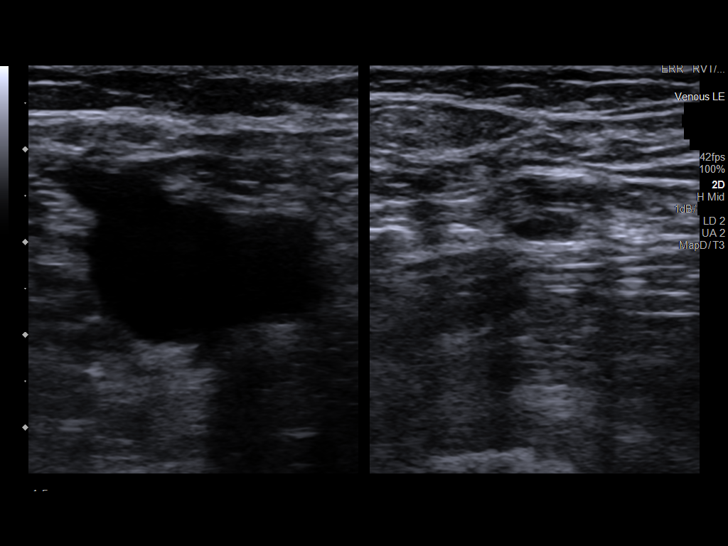
[im 4/37]
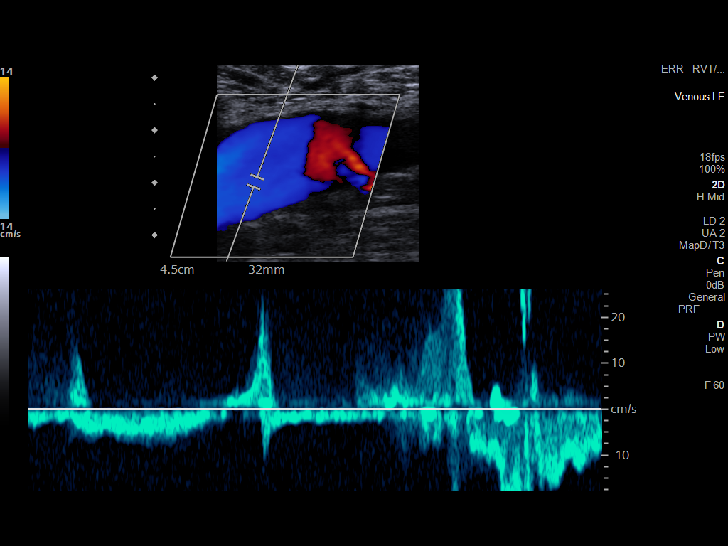
[im 7/37]
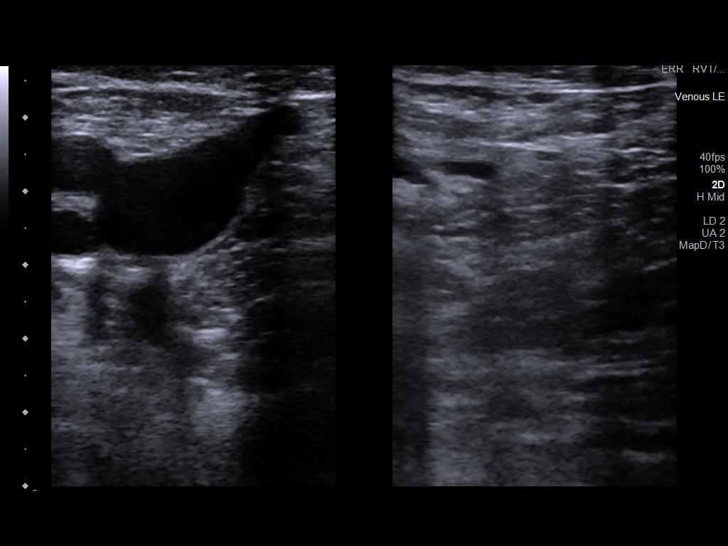
[im 10/37]
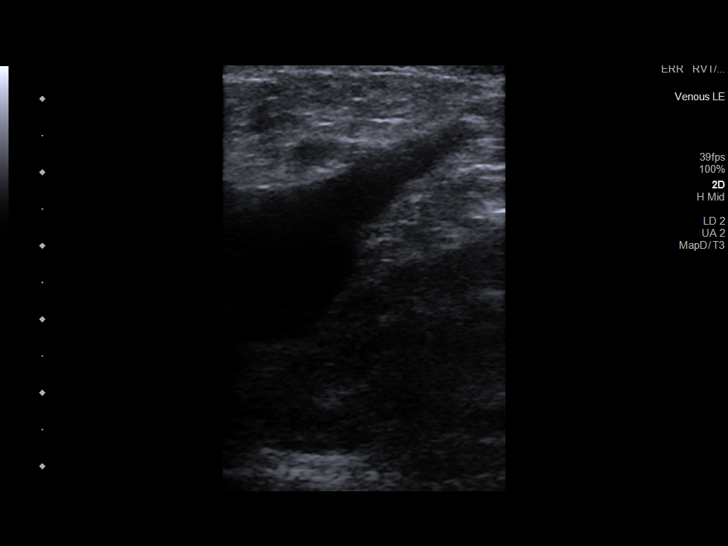
[im 11/37]
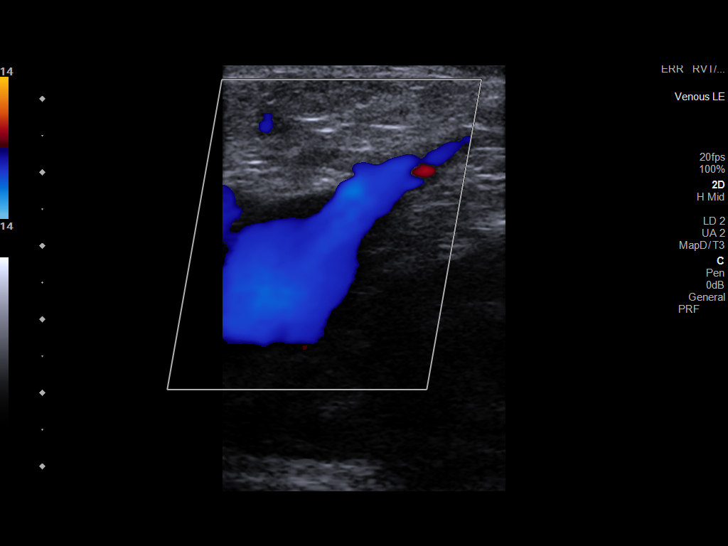
[im 15/37]
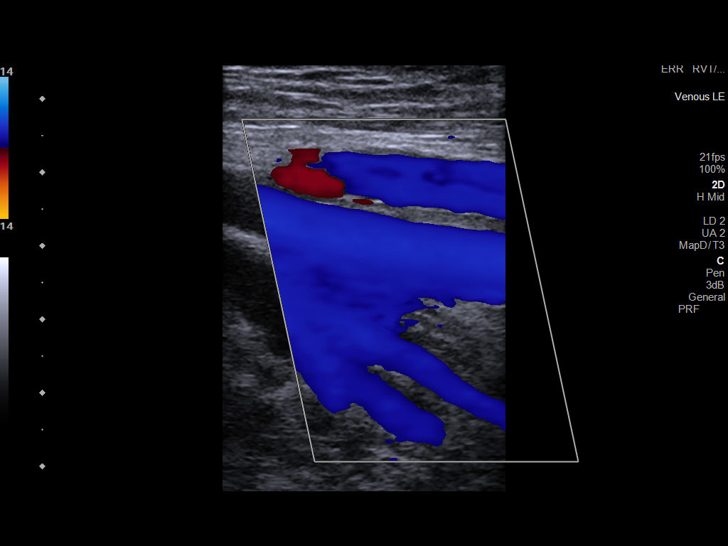
[im 18/37]
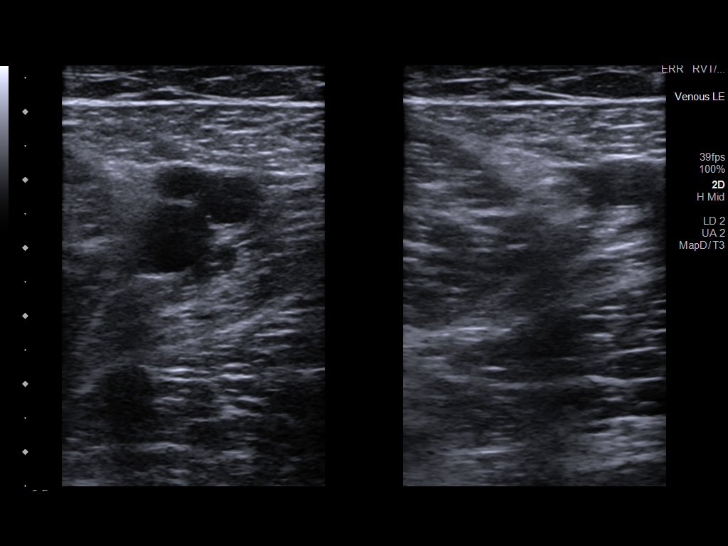
[im 19/37]
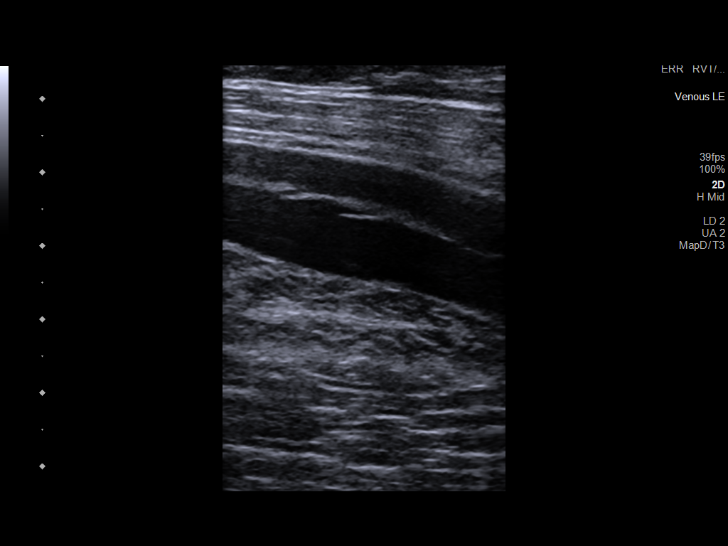
[im 22/37]
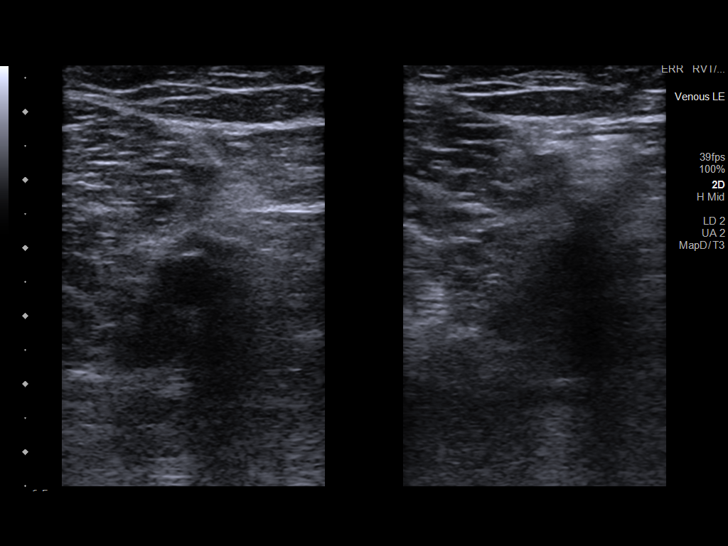
[im 26/37]
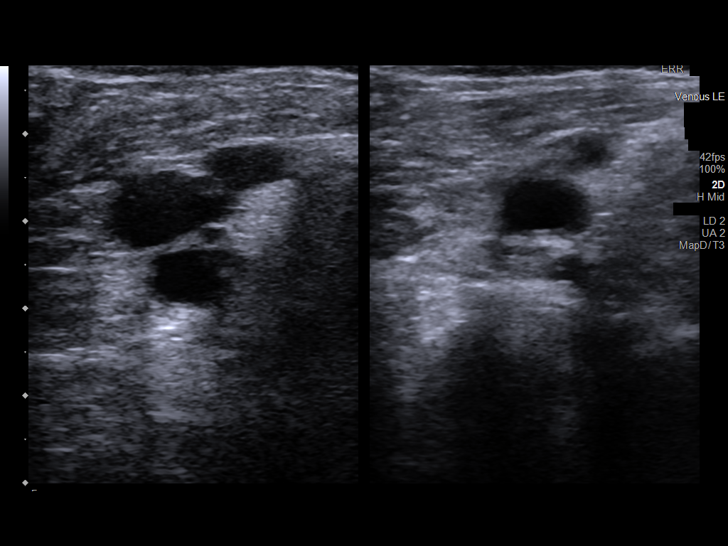
[im 29/37]
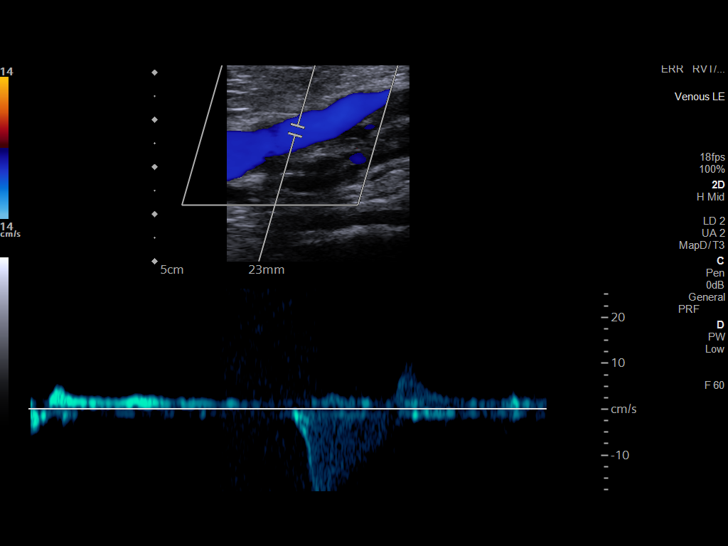
[im 30/37]
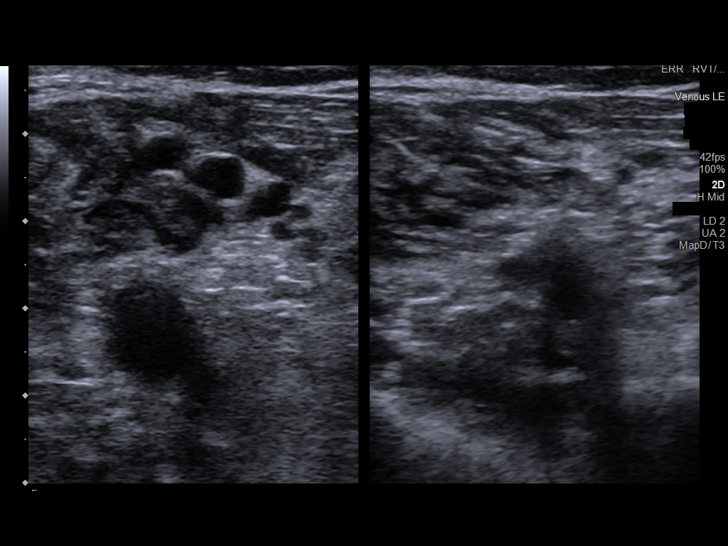
[im 33/37]
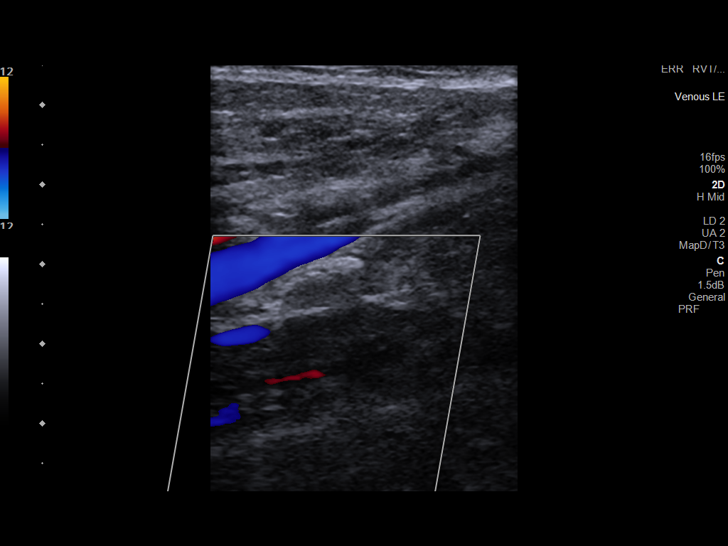
[im 37/37]
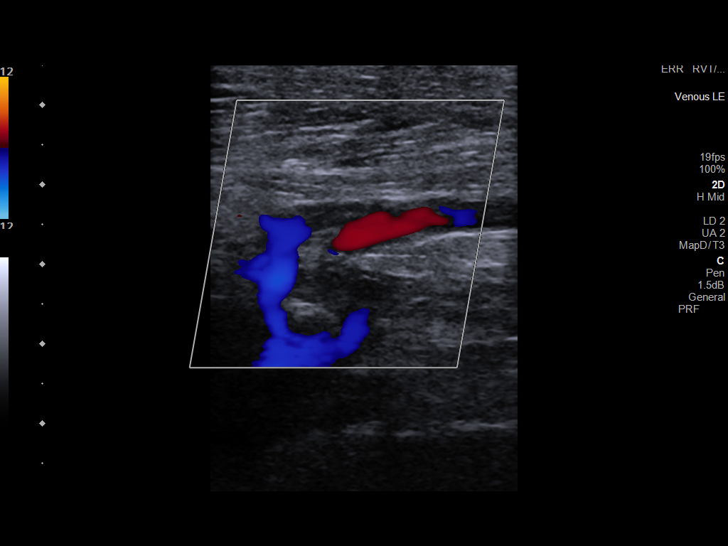

[14 of 24 positions shown; findings below may reference images not displayed]

FINDINGS: VENOUS

Normal compressibility of the common femoral, superficial femoral,
and popliteal veins, as well as the visualized calf veins.
Visualized portions of profunda femoral vein and great saphenous
vein unremarkable. No filling defects to suggest DVT on grayscale or
color Doppler imaging. Doppler waveforms show normal direction of
venous flow, normal respiratory plasticity and response to
augmentation.

Limited views of the contralateral common femoral vein are
unremarkable.

OTHER

None.

Limitations: none
IMPRESSION: Negative.

## 2020-04-29 ENCOUNTER — Other Ambulatory Visit: Payer: Self-pay

## 2020-04-29 ENCOUNTER — Encounter: Payer: Self-pay | Admitting: Internal Medicine

## 2020-04-29 ENCOUNTER — Ambulatory Visit (INDEPENDENT_AMBULATORY_CARE_PROVIDER_SITE_OTHER): Payer: Self-pay | Admitting: Internal Medicine

## 2020-04-29 VITALS — BP 135/88 | HR 74 | Temp 97.5°F | Ht 67.0 in | Wt 179.8 lb

## 2020-04-29 DIAGNOSIS — R198 Other specified symptoms and signs involving the digestive system and abdomen: Secondary | ICD-10-CM

## 2020-04-29 LAB — CARDIOLIPIN ANTIBODIES, IGG, IGM, IGA
Anticardiolipin IgA: <9 APL U/mL (ref 0–11)
Anticardiolipin IgG: <9 GPL U/mL (ref 0–14)
Anticardiolipin IgM: <9 MPL U/mL (ref 0–12)

## 2020-04-29 LAB — BETA-2-GLYCOPROTEIN I ABS, IGG/M/A
Beta-2 Glyco I IgG: <9 GPI IgG units (ref 0–20)
Beta-2-Glycoprotein I IgA: 18 GPI IgA units (ref 0–25)
Beta-2-Glycoprotein I IgM: <9 GPI IgM units (ref 0–32)

## 2020-04-29 LAB — LUPUS ANTICOAGULANT PANEL
DRVVT: 31.6 s (ref 0.0–47.0)
PTT Lupus Anticoagulant: 29.8 s (ref 0.0–51.9)

## 2020-04-29 LAB — PROTEIN S PANEL
Protein S Activity: 99 % (ref 63–140)
Protein S Ag, Free: 98 % (ref 61–136)
Protein S Ag, Total: 85 % (ref 60–150)

## 2020-04-29 LAB — PROTEIN C ACTIVITY: Protein C Activity: 132 % (ref 73–180)

## 2020-04-29 NOTE — Patient Instructions (Signed)
Continue hyoscyamine 0.375 mg tablet 1 each morning  Continue with soft toilet tissue, gentle wiping when performing hygiene  Office visit here in 3 months  If any interim concerns, please do not hesitate to call.

## 2020-04-29 NOTE — Progress Notes (Signed)
Primary Care Physician:  [facility] Primary Gastroenterologist:  Dr. Jena Gauss  Pre-Procedure History & Physical: HPI:  Logan Bush is a 60 y.o. male here for follow-up of fecal urgency and occasional incontinence.  Paper hematochezia.  Started hyoscyamine 0.375 mg in the morning his last visit.  Slowly things have turned around.  Bowel function decreasing;   paper hematochezia has also decreased; Xarelto was discontinued about a month ago.  Seen in the ED for increased bleeding 2 days after I saw him in the office.  Hemoglobin good in the 14 range.  No change in regimen.  No associated abdominal pain or other issues.  He states he has appreciated steady improvement since starting hyoscyamine once in the morning.  Past Medical History:  Diagnosis Date  . Allergy   . DVT (deep venous thrombosis) (HCC)   . Palpitation   . Vertigo     Past Surgical History:  Procedure Laterality Date  . COLONOSCOPY N/A 08/05/2017   Procedure: COLONOSCOPY;  Surgeon: Corbin Ade, MD;  [facility];  Service: Endoscopy;  Laterality: N/A;  1:45pm-rescheduled to 7/12 @9 :30am per  . POLYPECTOMY  10/2012  . POLYPECTOMY  08/05/2017   Procedure: POLYPECTOMY;  Surgeon: 10/06/2017, MD;  [facility];  Service: Endoscopy;;    Prior to Admission medications   Medication Sig Start Date End Date Taking? Authorizing Provider  hyoscyamine (LEVBID) 0.375 MG 12 hr tablet Take 1 tablet (0.375 mg total) by mouth in the morning. 03/11/20  Yes Kitt Ledet, 03/13/20, MD  sulfamethoxazole-trimethoprim (BACTRIM DS) 800-160 MG tablet Take 1 tablet by mouth 2 (two) times daily. Patient not taking: Reported on 04/29/2020 01/30/20   [provider]  RIVAROXABAN 03/29/20) VTE STARTER PACK (15 & 20 MG TABLETS) Follow package directions: Take one 15mg  tablet by mouth twice a day. On day 22, switch to one 20mg  tablet once a day. Take with food. 08/01/19 08/01/19  , MD     Allergies as of 04/29/2020 - Review Complete 04/29/2020  Allergen Reaction Noted  . Aspirin Other (See Comments) 10/14/2015    Family History  Problem Relation Age of Onset  . Heart attack Mother   . Hypertension Mother   . Cancer Father        prostate cancer  . Asthma Father   . Cancer Sister        breast cancer  . Cancer Maternal Grandmother        Breast Cancer  . Colon cancer Neg Hx   . Colon polyps Neg Hx     Social History   Socioeconomic History  . Marital status: Married    Spouse name: Not on file  . Number of children: 3  . Years of education: Not on file  . Highest education level: Not on file  Occupational History  . Not on file  Tobacco Use  . Smoking status: Passive Smoke Exposure - Never Smoker  . Smokeless tobacco: Never Used  Vaping Use  . Vaping Use: Never used  Substance and Sexual Activity  . Alcohol use: No  . Drug use: No  . Sexual activity: Not on file  Other Topics Concern  . Not on file  Social History Narrative   Right handed   Social Determinants of Health   Financial Resource Strain: Not on file  Food Insecurity: Not on file  Transportation Needs: Not on file  Physical Activity: Not on file  Stress: Not on  file  Social Connections: Not on file  Intimate Partner Violence: Not on file    Review of Systems: See HPI, otherwise negative ROS  Physical Exam: BP 135/88   Pulse 74   Temp (!) 97.5 F (36.4 C) (Temporal)   Ht 5\' 7"  (1.702 m)   Wt 179 lb 12.8 oz (81.6 kg)   BMI 28.16 kg/m  General:   Alert,  Well-developed, well-nourished, pleasant and cooperative in NAD Mouth:  No deformity or lesions. Neck:  Supple; no masses or thyromegaly. No significant cervical adenopathy. Lungs:  Clear throughout to auscultation.   No wheezes, crackles, or rhonchi. No acute distress. Heart:  Regular rate and rhythm; no murmurs, clicks, rubs,  or gallops. Abdomen: Non-distended, normal bowel sounds.  Soft and nontender without  appreciable mass or hepatosplenomegaly.  Pulses:  Normal pulses noted. Extremities:  Without clubbing or edema.  Impression/Plan: 60 year old gentleman with occasional fecal urgency, paper hematochezia in the setting of chronic anticoagulation therapy  -  occasional bouts of incontinence.  Up-to-date on colonoscopy.  Antispasmodic therapy has diminished his symptoms. I suspect we are dealing with an element of irritable bowel syndrome.  He has good sphincter tone.  Anticoagulation has been interrupted for the time being per prescribing physician.  Hemoglobin has remained normal.  Patient feels that he is steadily improving.  Recommendations:  Continue hyoscyamine 0.375 mg tablet 1 each morning  Continue with soft toilet tissue, gentle wiping when performing hygiene  Office visit here in 3 months  If any interim concerns, please do not hesitate to call.   Notice: This dictation was prepared with Dragon dictation along with smaller phrase technology. Any transcriptional errors that result from this process are unintentional and may not be corrected upon review.

## 2020-04-30 LAB — PROTEIN C, TOTAL: Protein C, Total: 110 % (ref 60–150)

## 2020-05-06 ENCOUNTER — Other Ambulatory Visit: Payer: Self-pay

## 2020-05-06 ENCOUNTER — Inpatient Hospital Stay (HOSPITAL_BASED_OUTPATIENT_CLINIC_OR_DEPARTMENT_OTHER): Payer: Self-pay | Admitting: Physician Assistant

## 2020-05-06 VITALS — BP 122/87 | HR 63 | Temp 97.2°F | Resp 18 | Wt 180.6 lb

## 2020-05-06 DIAGNOSIS — I8001 Phlebitis and thrombophlebitis of superficial vessels of right lower extremity: Secondary | ICD-10-CM

## 2020-05-06 LAB — FACTOR 5 LEIDEN

## 2020-05-06 LAB — PROTHROMBIN GENE MUTATION

## 2020-05-06 NOTE — Progress Notes (Signed)
[facility] [address], [place] [address]   CLINIC:  Medical Oncology/Hematology  PCP:  Logan Bush [address] [place] [address] [phone]   REASON FOR VISIT:  Follow-up for superficial venous thrombus  PRIOR THERAPY: Eliquis  CURRENT THERAPY: None  INTERVAL HISTORY:  Logan Bush 60 y.o. male returns for routine follow-up of his superficial venous thrombus of his right leg.  He was last seen on 03/27/2020.    Right lower extremity superficial venous thromboembolism was diagnosed in June/July 2021, considered provoked in the setting of fall from a ladder with right ankle injury.  He was not initially treated with anticoagulation, but when vascular specialist noted progression of clot toward the saphenofemoral junction, anticoagulation was started.  He completed 6 months of Eliquis, which was stopped on 03/24/2020 by vascular specialists.  Repeat venous duplex on 04/28/2020 did not note any residual superficial venous thromboembolism.  Patient returns today to discuss results of venous duplex as well as coagulopathy work-up.  Since his last visit, the patient reports that he has not been doing well.  Reports itching of bilateral lower extremities that feels like it is inside of his legs, not on the outside.  He denies swelling of his legs, but continues to have aching in his right thigh.   Patient had an episode of throbbing left-sided chest pain on Saturday while at rest.  He denies difficulty breathing.  The pain was worse with taking a deep breath.  The pain lasted several hours, but resolved on it's own.  No cough or hemoptysis.  He has had several ER visits in the past 3 years for complaints of chest pain, determined to be atypical chest pain and instructed to follow with his PCP.  He had an echocardiogram in September 2021, but has never had a stress test or seen a cardiologist.   No shortness of breath, dyspnea on exertion, cough,  hemoptysis, or palpitations.  He has very little energy but has 100% appetite. He endorses that he is maintaining a stable weight.    REVIEW OF SYSTEMS:  Review of Systems  Constitutional: Positive for fatigue. Negative for appetite change, chills, diaphoresis, fever and unexpected weight change.  HENT:   Negative for lump/mass and nosebleeds.   Eyes: Negative for eye problems.  Respiratory: Negative for cough, hemoptysis and shortness of breath.   Cardiovascular: Positive for chest pain. Negative for leg swelling and palpitations.  Gastrointestinal: Positive for constipation and nausea. Negative for abdominal pain, blood in stool, diarrhea and vomiting.  Genitourinary: Negative for hematuria.   Skin: Positive for itching (Bilateral lower extremities).  Neurological: Negative for dizziness, headaches and light-headedness.  Hematological: Does not bruise/bleed easily.      PAST MEDICAL/SURGICAL HISTORY:  Past Medical History:  Diagnosis Date  . Allergy   . DVT (deep venous thrombosis) (HCC)   . Palpitation   . Vertigo    Past Surgical History:  Procedure Laterality Date  . COLONOSCOPY N/A 08/05/2017   Procedure: COLONOSCOPY;  Surgeon: Corbin Ade, MD;  [facility];  Service: Endoscopy;  Laterality: N/A;  1:45pm-rescheduled to 7/12 @9 :30am per  . POLYPECTOMY  10/2012  . POLYPECTOMY  08/05/2017   Procedure: POLYPECTOMY;  Surgeon: 10/06/2017, MD;  [facility];  Service: Endoscopy;;     SOCIAL HISTORY:  Social History   Socioeconomic History  . Marital status: Married    Spouse name: Not on file  . Number  of children: 3  . Years of education: Not on file  . Highest education level: Not on file  Occupational History  . Not on file  Tobacco Use  . Smoking status: Passive Smoke Exposure - Never Smoker  . Smokeless tobacco: Never Used  Vaping Use  . Vaping Use: Never used  Substance and Sexual Activity  . Alcohol use: No  . Drug  use: No  . Sexual activity: Not on file  Other Topics Concern  . Not on file  Social History Narrative   Right handed   Social Determinants of Health   Financial Resource Strain: Not on file  Food Insecurity: Not on file  Transportation Needs: Not on file  Physical Activity: Not on file  Stress: Not on file  Social Connections: Not on file  Intimate Partner Violence: Not on file    FAMILY HISTORY:  Family History  Problem Relation Age of Onset  . Heart attack Mother   . Hypertension Mother   . Cancer Father        prostate cancer  . Asthma Father   . Cancer Sister        breast cancer  . Cancer Maternal Grandmother        Breast Cancer  . Colon cancer Neg Hx   . Colon polyps Neg Hx     CURRENT MEDICATIONS:  Outpatient Encounter Medications as of 05/06/2020  Medication Sig  . hyoscyamine (LEVBID) 0.375 MG 12 hr tablet Take 1 tablet (0.375 mg total) by mouth in the morning.  . sulfamethoxazole-trimethoprim (BACTRIM DS) 800-160 MG tablet Take 1 tablet by mouth 2 (two) times daily. (Patient not taking: Reported on 04/29/2020)  . [DISCONTINUED] RIVAROXABAN (XARELTO) VTE STARTER PACK (15 & 20 MG TABLETS) Follow package directions: Take one 15mg  tablet by mouth twice a day. On day 22, switch to one 20mg  tablet once a day. Take with food.   No facility-administered encounter medications on file as of 05/06/2020.    ALLERGIES:  Allergies  Allergen Reactions  . Aspirin Other (See Comments)    Makes GI bleeding     PHYSICAL EXAM:  ECOG PERFORMANCE STATUS: 0 - Asymptomatic  There were no vitals filed for this visit. There were no vitals filed for this visit. Physical Exam Constitutional:      Appearance: Normal appearance.  HENT:     Head: Normocephalic and atraumatic.     Mouth/Throat:     Mouth: Mucous membranes are moist.  Eyes:     Extraocular Movements: Extraocular movements intact.     Pupils: Pupils are equal, round, and reactive to light.  Cardiovascular:      Rate and Rhythm: Normal rate and regular rhythm.     Pulses: Normal pulses.     Heart sounds: Normal heart sounds.  Pulmonary:     Effort: Pulmonary effort is normal.     Breath sounds: Normal breath sounds.  Abdominal:     General: Bowel sounds are normal.     Palpations: Abdomen is soft.     Tenderness: There is no abdominal tenderness.  Musculoskeletal:        General: No swelling.     Right lower leg: No edema.     Left lower leg: No edema.  Lymphadenopathy:     Cervical: No cervical adenopathy.  Skin:    General: Skin is warm and dry.     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     Mental Status: He is alert and oriented to person, place, and time.  Psychiatric:        Mood and Affect: Mood is anxious.        Behavior: Behavior normal.      LABORATORY DATA:  I have reviewed the labs as listed.  CBC    Component Value Date/Time   WBC 3.8 (L) 03/13/2020 0734   RBC 4.77 03/13/2020 0734   HGB 14.5 03/13/2020 0734   HCT 43.7 03/13/2020 0734   PLT 198 03/13/2020 0734   MCV 91.6 03/13/2020 0734   MCH 30.4 03/13/2020 0734   MCHC 33.2 03/13/2020 0734   RDW 12.3 03/13/2020 0734   LYMPHSABS 1.2 03/13/2020 0734   MONOABS 0.4 03/13/2020 0734   EOSABS 0.2 03/13/2020 0734   BASOSABS 0.0 03/13/2020 0734   CMP Latest Ref Rng & Units 03/13/2020 07/15/2018 02/04/2018  Glucose 70 - 99 mg/dL 86 84 130(Q120(H)  BUN 6 - 20 mg/dL 65(H21(H) 18 17  Creatinine 0.61 - 1.24 mg/dL 8.461.04 9.621.13 9.521.11  Sodium 135 - 145 mmol/L 139 139 139  Potassium 3.5 - 5.1 mmol/L 3.6 3.6 3.6  Chloride 98 - 111 mmol/L 106 104 105  CO2 22 - 32 mmol/L 27 28 27   Calcium 8.9 - 10.3 mg/dL 9.4 8.9 9.2  Total Protein 6.5 - 8.1 g/dL 6.7 - 7.8  Total Bilirubin 0.3 - 1.2 mg/dL 8.4(X1.5(H) - 2.0(H)  Alkaline Phos 38 - 126 U/L 48 - 61  AST 15 - 41 U/L 23 - 21  ALT 0 - 44 U/L 28 - 31    DIAGNOSTIC IMAGING:  I have independently reviewed the  relevant imaging and discussed with the patient.  ASSESSMENT: 1. Superficial venous thrombus of right great saphenous vein, provoked -Initial presentation in June 2021 after experiencing RLE pain and redness following fall from ladder -Found to have right lower extremity thrombophlebitis with clot in right saphenous vein in June 2021 -Serial ultrasound on 08/22/2019 showed progression of clot in the great saphenous vein extending to the saphenofemoral junction 1.2 cm from deep venous system -Placed on anticoagulation by vascular surgeon, completed 5357-month course of Eliquis on 03/24/2020 (tolerated well despite history of hemorrhoidal bleeding) -Repeat venous ultrasound performed on 03/24/2020 showed thrombus involving the great saphenous vein in the proximal thigh and throughout the calf. -Repeat venous US on 04/28/2020 was negative for DVT, and noted that "visualized portions of the great saphenous vein were unremarkable" -D-Dimer checked on 04/28/2020 was mildly elevated ay 0.61 -Coagulopathy workup was NEGATIVE (checked Factor V Leiden, beta-2 glycoprotein antibodies, cardiolipin antibodies, lupus anticoagulant, protein C, protein S, and antithrombin 3) -Prothrombin gene mutation test is pending  2.  Family/social history -He has no personal or family history of blood clots or known coagulopathies. -However, his mother had 6 miscarriages (as well as 4 living children). -No personal history of cancer. His father had prostate cancer. His sister and meternal grandmother had breast cancer. -He works as a Gafferhandyman.   PLAN:  1. Superficial venous thrombus of right great saphenous vein -Venous ultrasound on 04/28/2020 showed the visualized portions of the great saphenous vein were unremarkable, but D-dimer remains mildly elevated at 0.61 -Coagulopathy panel NEGATIVE (pending results of prothrombin gene mutation test) -Continue to hold Eliquis -Will check CTA chest to rule out PE due to complaints of  chest pain -We will obtain repeat D-dimer and repeat venous duplex of right lower extremity in 4 months -Return to clinic in 4  months to discuss results -Patient educated on alarm symptoms, instructed to call our office immediately if he has any increasing pain, redness, or swelling of his right lower extremity.  Instructed to present to the emergency department with any new chest pain or shortness of breath.  2. Chest pain -None today, but had some last weekend that resolved on its own (see HPI) -He has had several ER visits in the past 3 years for complaints of chest pain, determined to be atypical chest pain and instructed to follow with his PCP.  He had an echocardiogram in September 2021, but has never had a stress test or seen a cardiologist.  -Recommend that the patient discuss further with his PCP, would likely benefit from referral to cardiologist for stress test and full cardiac work-up  -As noted above, will check CTA chest to rule out PE in the setting of chest pain and known lower extremity thromboembolism  3. Leg itching and pain -Suspect that ongoing symptoms may be due to stasis dermatitis, underlying venous insufficiency -Continue to follow with PCP   PLAN SUMMARY & DISPOSITION: -Obtain CTA chest to check for PE - RTC to discuss results (per patient request) -Repeat D-dimer and venous ultrasound in 4 months - RTC in 4 months to discuss results  All questions were answered. The patient knows to call the clinic with any problems, questions or concerns.  Medical decision making: Low  Time spent on visit: I spent 15 minutes counseling the patient face to face. The total time spent in the appointment was 20 minutes and more than 50% was on counseling.  The above clinical data and plan of treatment was discussed with Dr. Ellin Saba, supervising physician, who agrees.  Carnella Guadalajara, PA-C  05/06/20 1:15 PM

## 2020-05-06 NOTE — Patient Instructions (Addendum)
[facility] at [facility] Discharge Instructions  You were seen today by Rojelio Brenner PA-C for your right leg blood clots.  - The most recent ultrasound of your right leg did not show any blood clots - You do NOT need to start taking Eliquis again. - We will check a CTA of your chest to make sure you do not have any blood clots in your lungs (since you have been complaining of chest pain and have a history of blood clots) - We recommend that your primary care doctor refers you to see a CARDIOLOGIST for full testing of your heart (because of your chest pains). - Come to the emergency department if you have increasing pain, redness, or swelling of his right leg; or if you have new chest pain or shortness of breath, or coughing up blood  LABS: Return in 4 months for repeat labs   OTHER TESTS: Check CTA chest this week.  Check [place] of leg in 4 months.  MEDICATIONS: No changes.  FOLLOW-UP APPOINTMENT: Office visit after CTA chest to discuss results.  Office visit in 4 months (to follow up after ultrasound of leg)   Thank you for choosing [facility] at [facility] to provide your oncology and hematology care.  To afford each patient quality time with our provider, please arrive at least 15 minutes before your scheduled appointment time.   If you have a lab appointment with the Cancer Center please come in thru the Main Entrance and check in at the main information desk.  You need to re-schedule your appointment should you arrive 10 or more minutes late.  We strive to give you quality time with our providers, and arriving late affects you and other patients whose appointments are after yours.  Also, if you no show three or more times for appointments you may be dismissed from the clinic at the providers discretion.     Again, thank you for choosing [facility].  Our hope is that these requests will decrease the amount of time that you  wait before being seen by our physicians.       _____________________________________________________________  Should you have questions after your visit to [facility], please contact our office at [phone] and follow the prompts.  Our office hours are 8:00 a.m. and 4:30 p.m. Monday - Friday.  Please note that voicemails left after 4:00 p.m. may not be returned until the following business day.  We are closed weekends and major holidays.  You do have access to a nurse 24-7, just call the main number to the clinic [phone] and do not press any options, hold on the line and a nurse will answer the phone.    For prescription refill requests, have your pharmacy contact our office and allow 72 hours.    Due to Covid, you will need to wear a mask upon entering the hospital. If you do not have a mask, a mask will be given to you at the Main Entrance upon arrival. For doctor visits, patients may have 1 support person age 51 or older with them. For treatment visits, patients can not have anyone with them due to social distancing guidelines and our immunocompromised population.

## 2020-05-08 ENCOUNTER — Ambulatory Visit (HOSPITAL_COMMUNITY)
Admission: RE | Admit: 2020-05-08 | Discharge: 2020-05-08 | Disposition: A | Discharge status: Home / Self Care | Payer: Self-pay | Source: Ambulatory Visit | Attending: Physician Assistant | Admitting: Physician Assistant

## 2020-05-08 ENCOUNTER — Ambulatory Visit (HOSPITAL_COMMUNITY): Payer: Self-pay

## 2020-05-08 DIAGNOSIS — I8001 Phlebitis and thrombophlebitis of superficial vessels of right lower extremity: Secondary | ICD-10-CM | POA: Insufficient documentation

## 2020-05-08 IMAGING — CT CT ANGIO CHEST
2 of 6 series · 19 of 36 positions shown · IV contrast (Omnipaque or Isovue)
Comparison: Prior CT of the chest on [DATE]

CLINICAL DATA: Chest pain and history of prior lower extremity
superficial thrombophlebitis.

EXAM:
CT ANGIOGRAPHY CHEST WITH CONTRAST
TECHNIQUE: Multidetector CT imaging of the chest was performed using the
standard protocol during bolus administration of intravenous
contrast. Multiplanar CT image reconstructions and MIPs were
obtained to evaluate the vascular anatomy.
CONTRAST:  75 mL Omnipaque 350 IV

[Series 5: pe axial thins · axial · 0.67mm/px · z∈[+1273,+1553]mm · 18 of 388 slices shown]
[im 19/388  lung]
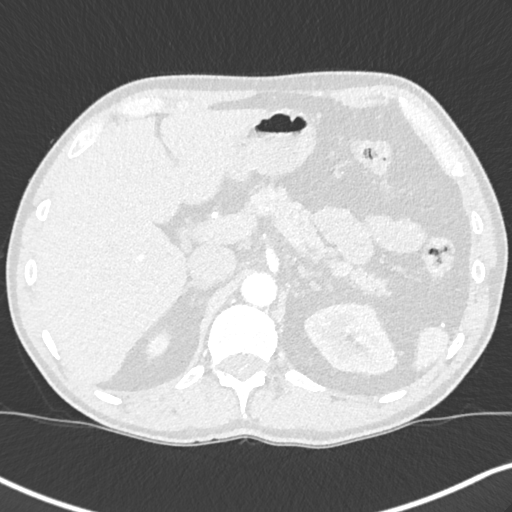
[im 37/388  mediastinal]
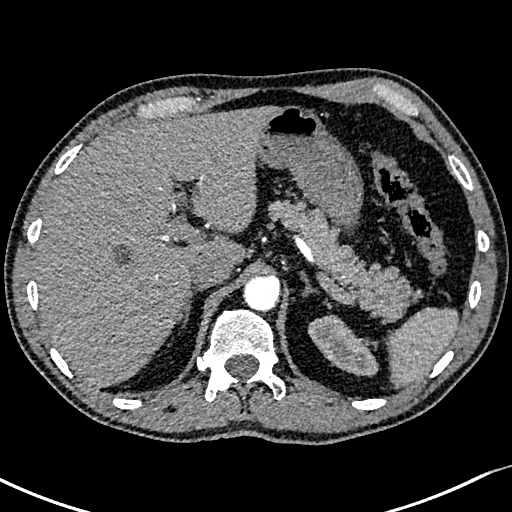
[im 56/388  lung]
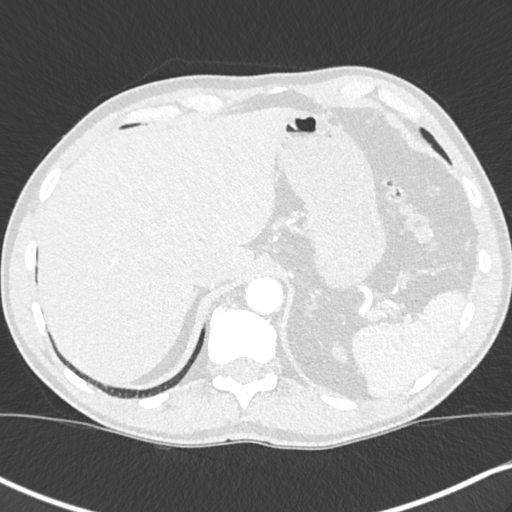
[im 74/388  mediastinal]
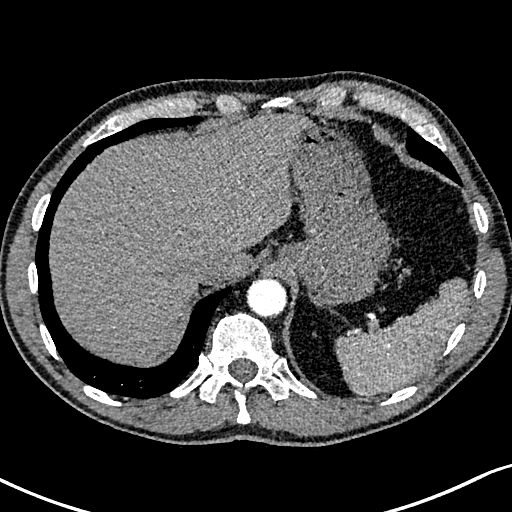
[im 111/388  lung]
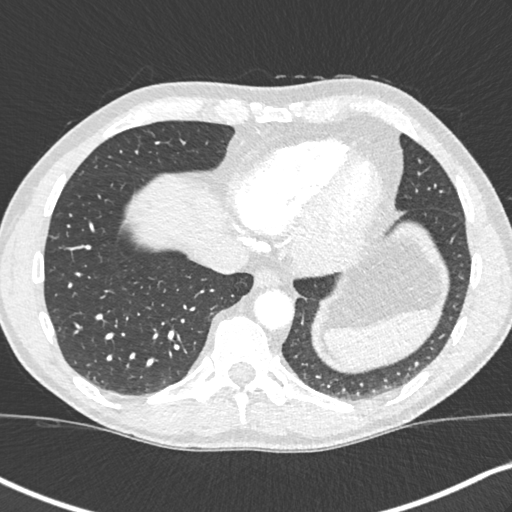
[im 130/388  mediastinal]
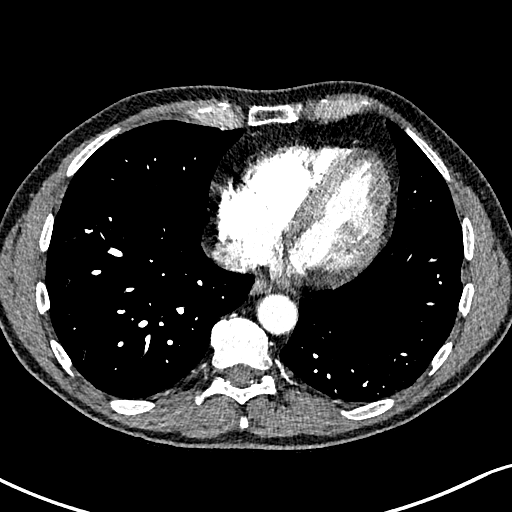
[im 148/388  lung]
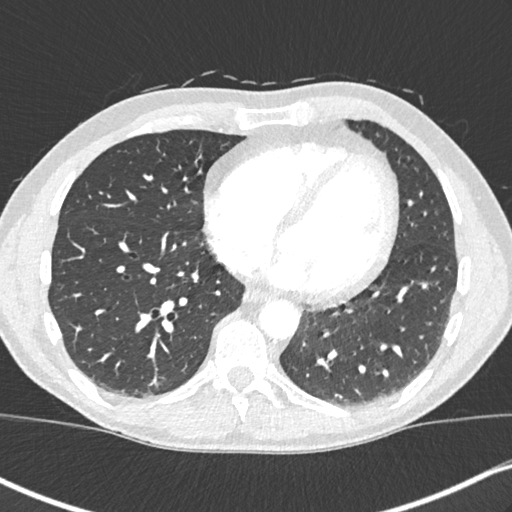
[im 166/388  mediastinal]
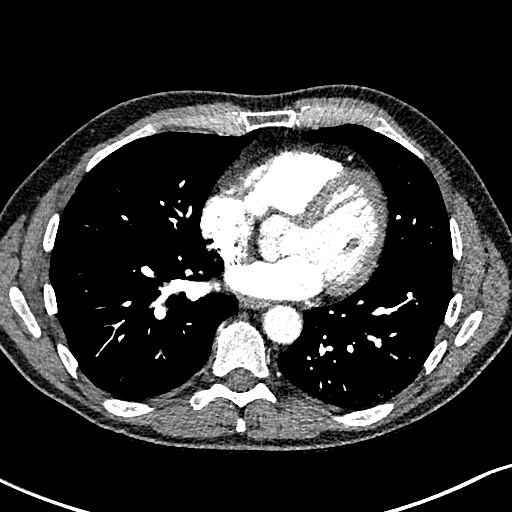
[im 185/388  lung]
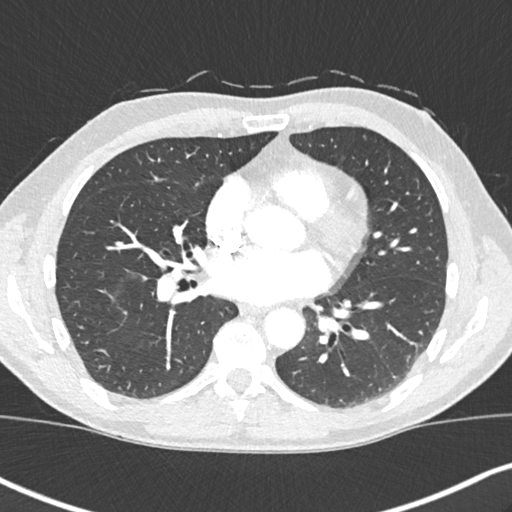
[im 203/388  mediastinal]
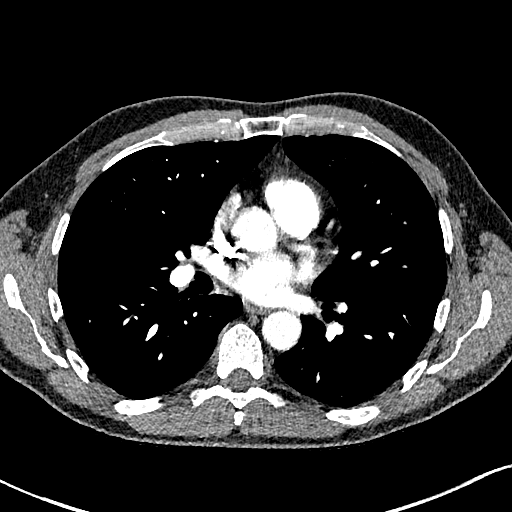
[im 222/388  lung]
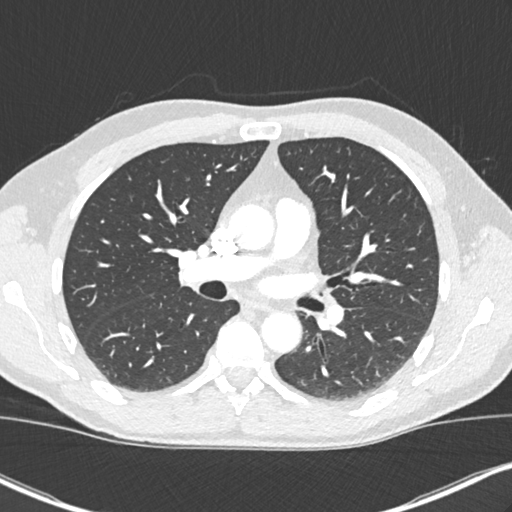
[im 240/388  mediastinal]
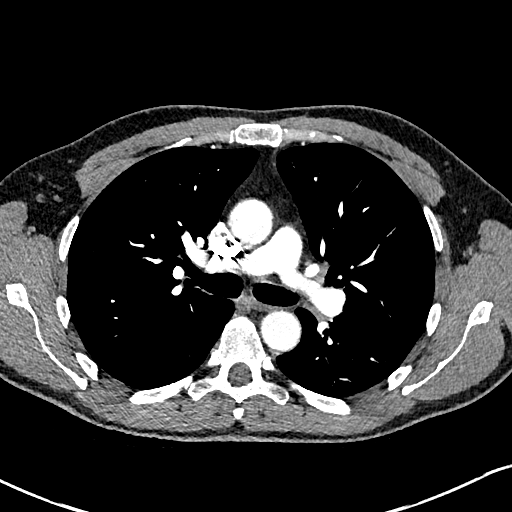
[im 259/388  lung]
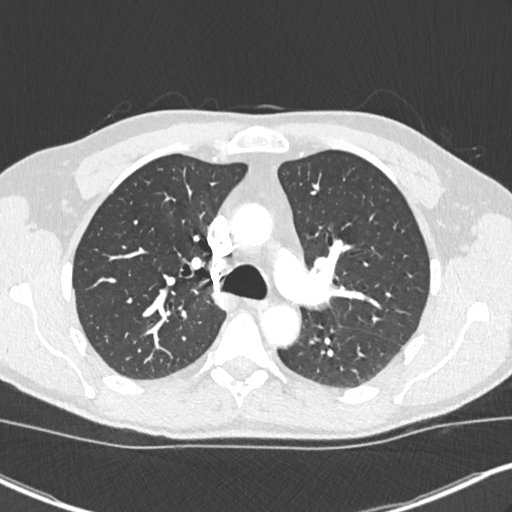
[im 295/388  mediastinal]
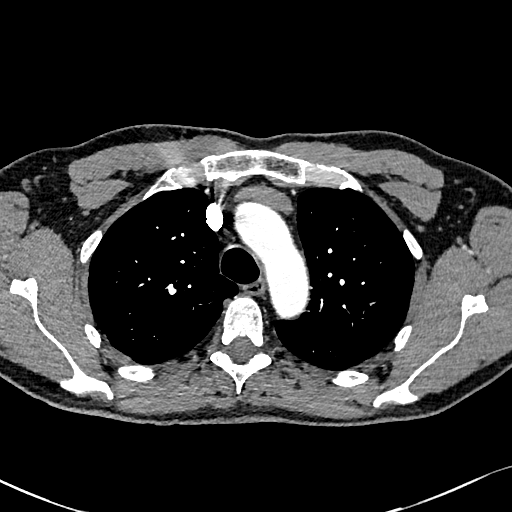
[im 314/388  lung]
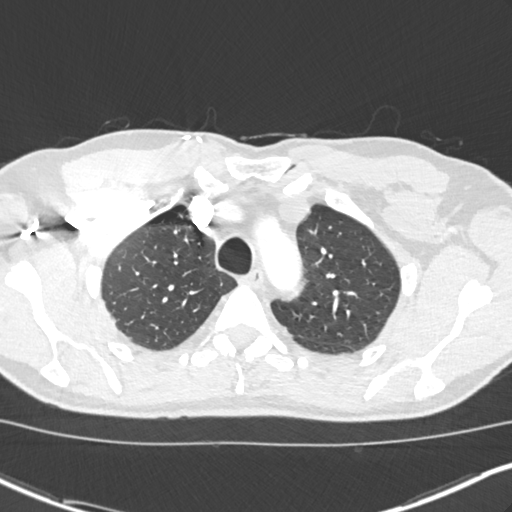
[im 332/388  mediastinal]
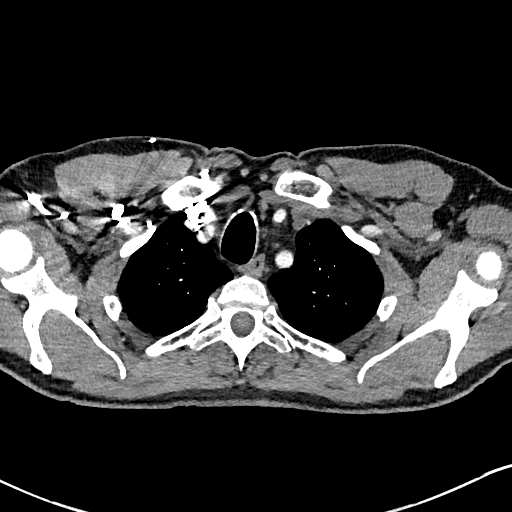
[im 351/388  lung]
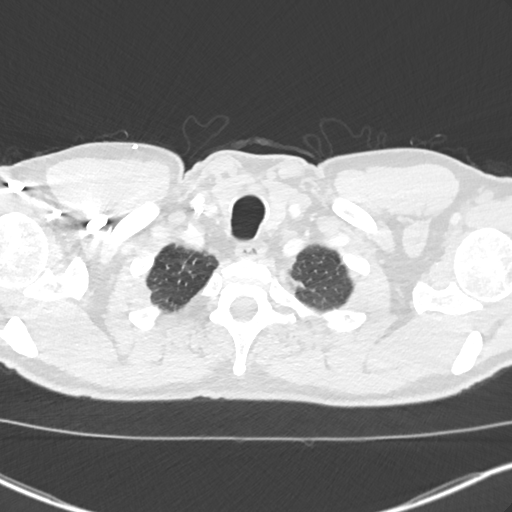
[im 369/388  mediastinal]
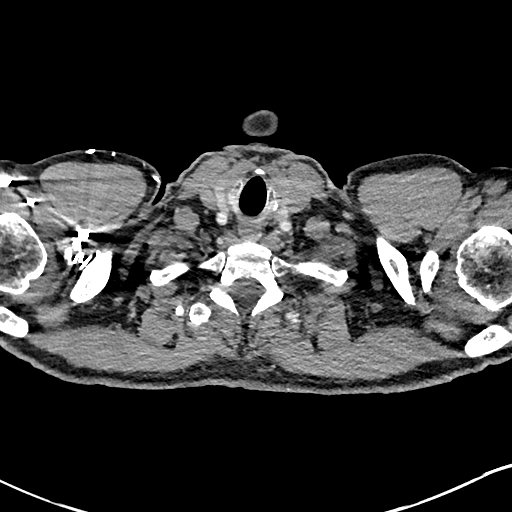

[Series 7: cor soft · coronal · 0.63mm/px · 1 of 148 slices shown]
[im 74/148  mediastinal]
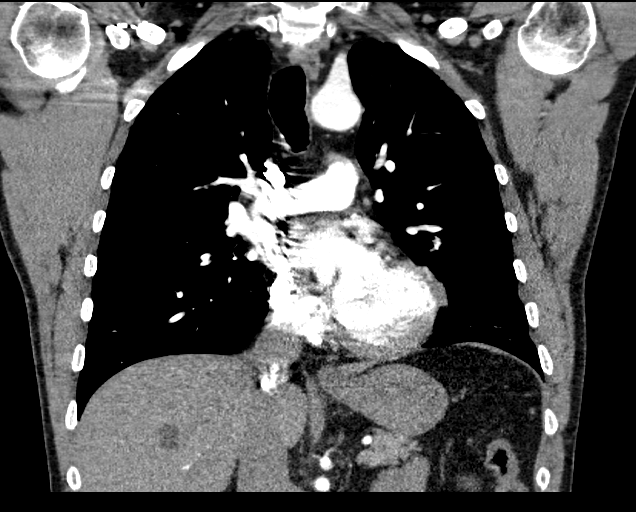

[19 of 36 positions shown; findings below may reference images not displayed]

FINDINGS: Cardiovascular: The pulmonary arteries are well opacified. There is
no evidence of pulmonary embolism. Central pulmonary arteries are
normal in caliber. The thoracic aorta is also well visualized and
opacified and demonstrates no evidence of aneurysmal disease,
dissection or atherosclerosis.

The heart size is normal. No pericardial fluid identified. No
significant calcified coronary artery plaque visualized.

Mediastinum/Nodes: No enlarged mediastinal, hilar, or axillary lymph
nodes. Thyroid gland, trachea, and esophagus demonstrate no
significant findings.

Lungs/Pleura: Small subpleural nodular scarring at the lateral right
apex is stable since the prior CT and therefore benign. There is no
evidence of pulmonary edema, consolidation, pneumothorax or pleural
fluid.

Upper Abdomen: Stable cyst in the central right lobe of the liver.

Musculoskeletal: No chest wall abnormality. No acute or significant
osseous findings.

Review of the MIP images confirms the above findings.
IMPRESSION: No evidence of pulmonary embolism or other acute findings in the
chest.

## 2020-05-08 MED ORDER — IOHEXOL 350 MG/ML SOLN
75.0000 mL | Freq: Once | INTRAVENOUS | Status: AC | PRN
  Administered 2020-05-08: 75 mL via INTRAVENOUS

## 2020-05-14 ENCOUNTER — Ambulatory Visit (HOSPITAL_COMMUNITY): Payer: Self-pay | Admitting: Physician Assistant

## 2020-05-15 ENCOUNTER — Inpatient Hospital Stay (HOSPITAL_BASED_OUTPATIENT_CLINIC_OR_DEPARTMENT_OTHER): Payer: Self-pay | Admitting: Physician Assistant

## 2020-05-15 ENCOUNTER — Other Ambulatory Visit: Payer: Self-pay

## 2020-05-15 VITALS — BP 118/80 | HR 67 | Temp 98.4°F | Resp 18 | Wt 179.0 lb

## 2020-05-15 DIAGNOSIS — I8001 Phlebitis and thrombophlebitis of superficial vessels of right lower extremity: Secondary | ICD-10-CM

## 2020-05-15 NOTE — Progress Notes (Signed)
[facility] [address] [place], [place] [address]   CLINIC:  Medical Oncology/Hematology  PCP:  [facility] [place] [facility] [address] [place] [id number] [phone]   REASON FOR VISIT:  Follow-up for superficial venous thrombus  PRIOR THERAPY: Eliquis  CURRENT THERAPY: Observation  INTERVAL HISTORY:  [name] 60 y.o. male returns for routine follow-up of test results.  He has been followed by our office due to superficial venous thrombus of his right leg while we assess his need for anticoagulation.  He was last seen on 05/06/2020.  At that time, he complained of pleuritic chest pain and due to his history of superficial venous thrombus, CTA chest was ordered and performed on 05/08/2020, showing no evidence of pulmonary embolism or other acute findings in the chest.  Patient requested to follow-up in office to discuss these results.  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  At the time of today's visit, he reports that his chest pain has not recurred since our last discussion.  He reports that sometimes when he is sitting he feels his heart "beating fast" and needs to go outside for fresh air.  He is again encouraged to follow-up for cardiologist for further work-up.  He is concerned about his veins, and states that he sees intermittent "pulsing" of his lower extremity veins, that resolves within a few minutes.  He does not have any lower extremity edema at this time.  He continues to reports itching of bilateral lower extremities that feels like it is inside of his legs, not on the outside.  He denies swelling of his legs, but continues to have aching in his right thigh. No shortness of breath, dyspnea on exertion, cough, hemoptysis, or palpitations.  He continues to complain of very  little energy but has 100% appetite. He endorses that he is maintaining a stable weight.  Patient reports that he "just felt better" when he was on Eliquis.  After stopping Eliquis, he says that he has felt tired and not happy, but as explained to him, there is no current indication for him to resume Eliquis at this time.  REVIEW OF SYSTEMS:  Review of Systems  Constitutional: Positive for fatigue. Negative for appetite change, chills, diaphoresis, fever and unexpected weight change.  HENT:   Negative for lump/mass and nosebleeds.   Eyes: Negative for eye problems.  Respiratory: Negative for cough, hemoptysis and shortness of breath.   Cardiovascular: Negative for chest pain, leg swelling and palpitations.  Gastrointestinal: Negative for abdominal pain, blood in stool, constipation, diarrhea, nausea and vomiting.  Genitourinary: Negative for hematuria.   Skin: Positive for itching.  Neurological: Negative for dizziness, headaches and light-headedness.  Hematological: Does not bruise/bleed easily.  Psychiatric/Behavioral: The patient is nervous/anxious.       PAST MEDICAL/SURGICAL HISTORY:  Past Medical History:  Diagnosis Date  . Allergy   . DVT (deep venous thrombosis) (HCC)   . Palpitation   . Vertigo    Past Surgical History:  Procedure Laterality Date  . COLONOSCOPY N/A 08/05/2017   Procedure: COLONOSCOPY;  Surgeon: Corbin Adeourk, Logan M, MD;  [facility];  Service: Endoscopy;  Laterality: N/A;  1:45pm-rescheduled to 7/12 @9 :30am per Darlina RumpfMartina  . POLYPECTOMY  10/2012  . POLYPECTOMY  08/05/2017   Procedure: POLYPECTOMY;  Surgeon: Corbin Adeourk, Logan M, MD;  [facility];  Service: Endoscopy;;     SOCIAL HISTORY:  Social History   Socioeconomic History  . Marital status: Married    Spouse name: Not on file  . Number of children: 3  . Years of education: Not on file  . Highest education level: Not on file  Occupational History  . Not on file  Tobacco Use  .  Smoking status: Passive Smoke Exposure - Never Smoker  . Smokeless tobacco: Never Used  Vaping Use  . Vaping Use: Never used  Substance and Sexual Activity  . Alcohol use: No  . Drug use: No  . Sexual activity: Not on file  Other Topics Concern  . Not on file  Social History Narrative   Right handed   Social Determinants of Health   Financial Resource Strain: Not on file  Food Insecurity: Not on file  Transportation Needs: Not on file  Physical Activity: Not on file  Stress: Not on file  Social Connections: Not on file  Intimate Partner Violence: Not on file    FAMILY HISTORY:  Family History  Problem Relation Age of Onset  . Heart attack Mother   . Hypertension Mother   . Cancer Father        prostate cancer  . Asthma Father   . Cancer Sister        breast cancer  . Cancer Maternal Grandmother        Breast Cancer  . Colon cancer Neg Hx   . Colon polyps Neg Hx     CURRENT MEDICATIONS:  Outpatient Encounter Medications as of 05/15/2020  Medication Sig  . hyoscyamine (LEVBID) 0.375 MG 12 hr tablet Take 1 tablet (0.375 mg total) by mouth in the morning.  . [DISCONTINUED] RIVAROXABAN (XARELTO) VTE STARTER PACK (15 & 20 MG TABLETS) Follow package directions: Take one 15mg  tablet by mouth twice a day. On day 22, switch to one 20mg  tablet once a day. Take with food.   No facility-administered encounter medications on file as of 05/15/2020.    ALLERGIES:  Allergies  Allergen Reactions  . Aspirin Other (See Comments)    Makes GI bleeding     PHYSICAL EXAM:  ECOG PERFORMANCE STATUS: 0 - Asymptomatic  There were no vitals filed for this visit. There were no vitals filed for this visit. Physical Exam Constitutional:      Appearance: Normal appearance.  HENT:     Head: Normocephalic and atraumatic.     Mouth/Throat:     Mouth: Mucous membranes are moist.  Eyes:     Extraocular Movements: Extraocular movements intact.     Pupils: Pupils are equal, round, and  reactive to light.  Cardiovascular:     Rate and Rhythm: Normal rate and regular rhythm.     Pulses: Normal pulses.     Heart sounds: Normal heart sounds.  Pulmonary:     Effort: Pulmonary effort is normal.     Breath sounds: Normal breath sounds.  Abdominal:     General: Bowel sounds are normal.     Palpations: Abdomen is soft.     Tenderness: There is no abdominal tenderness.  Musculoskeletal:        General: No swelling.     Right lower leg: No edema.     Left lower leg: No edema.  Lymphadenopathy:     Cervical: No cervical adenopathy.  Skin:    General: Skin is warm and dry.     Comments: Varicose veins noted of bilateral ankles  and right medial thigh  Neurological:     General: No focal deficit present.     Mental Status: He is alert and oriented to person, place, and time.  Psychiatric:        Mood and Affect: Mood is anxious.        Behavior: Behavior normal.      LABORATORY DATA:  I have reviewed the labs as listed.  CBC    Component Value Date/Time   WBC 3.8 (L) 03/13/2020 0734   RBC 4.77 03/13/2020 0734   HGB 14.5 03/13/2020 0734   HCT 43.7 03/13/2020 0734   PLT 198 03/13/2020 0734   MCV 91.6 03/13/2020 0734   MCH 30.4 03/13/2020 0734   MCHC 33.2 03/13/2020 0734   RDW 12.3 03/13/2020 0734   LYMPHSABS 1.2 03/13/2020 0734   MONOABS 0.4 03/13/2020 0734   EOSABS 0.2 03/13/2020 0734   BASOSABS 0.0 03/13/2020 0734   CMP Latest Ref Rng & Units 03/13/2020 07/15/2018 02/04/2018  Glucose 70 - 99 mg/dL 86 84 093(A)  BUN 6 - 20 mg/dL 35(T) 18 17  Creatinine 0.61 - 1.24 mg/dL 7.32 2.02 5.42  Sodium 135 - 145 mmol/L 139 139 139  Potassium 3.5 - 5.1 mmol/L 3.6 3.6 3.6  Chloride 98 - 111 mmol/L 106 104 105  CO2 22 - 32 mmol/L 27 28 27   Calcium 8.9 - 10.3 mg/dL 9.4 8.9 9.2  Total Protein 6.5 - 8.1 g/dL 6.7 - 7.8  Total Bilirubin 0.3 - 1.2 mg/dL ) - 2.0(H)  Alkaline Phos 38 - 126 U/L 48 - 61  AST 15 - 41 U/L 23 - 21  ALT 0 - 44 U/L 28 - 31    DIAGNOSTIC  IMAGING:  I have independently reviewed the relevant imaging and discussed with the patient.  ASSESSMENT: 1. Superficial venous thrombus of right great saphenous vein, provoked -Initial presentation in June 2021 after experiencing RLE pain and redness following fall from ladder -Found to have right lower extremity thrombophlebitis with clot in right saphenous vein in June 2021 -Serial ultrasound on 08/22/2019 showed progression of clot in the great saphenous vein extending to the saphenofemoral junction 1.2 cm from deep venous system -Placed on anticoagulation by vascular surgeon, completed 68-month course of Eliquis on 03/24/2020 (tolerated well despite history of hemorrhoidal bleeding) -Repeat venous ultrasound performed on 03/24/2020 showedthrombus involving the great saphenous vein in the proximal thigh and throughout the calf. -Repeat venous 03/26/2020 on 04/28/2020 was negative for DVT, and noted that "visualized portions of the great saphenous vein were unremarkable" -D-Dimer checked on 04/28/2020 was mildly elevated ay 0.61 -Prothrombin gene mutation test is  positive for C.*97G>A heterozygosity, which does place him at a slightly higher risk (2-4 x higher) of venous thromboembolism than the general population. -CTA chest checked on 05/08/2020 due to pleuritic chest pain was negative for PE  2.Family/social history -He has no personal or family history of blood clots or known coagulopathies. -However, his mother had 6 miscarriages (as well as 4 living children). -No personal history of cancer. His father had prostate cancer. His sister and meternal grandmother had breast cancer. -He works as a 05/10/2020.   PLAN:  1. Superficial venous thrombus of right great saphenous vein -Venous ultrasound on 04/28/2020 showed the visualized portions of the great saphenous vein were unremarkable, but D-dimer remains mildly elevated at 0.61 -Coagulopathy panel revealed prothrombin gene mutation test is  positive  for C.*97G>A heterozygosity, which does place him at a slightly higher risk (2-4 x higher)  of venous thromboembolism than the general population - however, no indication for lifelong anticoagulation without history of unprovoked clot  -Continue to hold Eliquis -We will obtain repeat D-dimer and repeat venous duplex of right lower extremity in 4 months -Return to clinic in 4 months to discuss results -Patient educated on alarm symptoms, instructed to call our office immediately if he has any increasing pain, redness, or swelling of his right lower extremity. Instructed to present to the emergency department with any new chest pain or shortness of breath.  2. Chest pain -He has had several ER visits in the past 3 years for complaints of chest pain, determined to be atypical chest pain and instructed to follow with his PCP. He had an echocardiogram in September 2021, but has never had a stress test or seen a cardiologist. -CTA chest on 05/08/2020 was negative for pulmonary embolism - Patient reports that he sometimes feels like his heart is beating "really fast" -Recommended again to the patient that he discuss further with his PCP, would likely benefit from referral to cardiologist for stress test and full cardiac work-up   3. Leg itching and pain -Suspect that ongoing symptoms may be due to stasis dermatitis, with possible underlying venous insufficiency -Continue to follow with PCP - He also reports that his veins sometimes "throb" and "pop out of his skin" -most recent venous duplex was negative for clot, and no physical exam findings consistent with DVT at the time of this visit - recommended that patient discuss with his vascular specialists if he remains concerned   PLAN SUMMARY & DISPOSITION: -Repeat D-dimer and venous ultrasound in 4 months - RTC in 4 months to discuss results  All questions were answered. The patient knows to call the clinic with any problems, questions or  concerns.  Medical decision making: Low  Time spent on visit: I spent 15 minutes counseling the patient face to face. The total time spent in the appointment was 20 minutes and more than 50% was on counseling.   Carnella Guadalajara, PA-C  05/15/20 10:34 AM

## 2020-07-22 ENCOUNTER — Encounter: Payer: Self-pay | Admitting: Internal Medicine

## 2020-08-04 ENCOUNTER — Telehealth: Payer: Self-pay

## 2020-08-04 NOTE — Telephone Encounter (Signed)
Patient left VM today about left leg and arm pain. He was seen by [facility] in September 21 and April 2022 for thrombus in his right leg. He was on DOAC for 6 months and taken off in April. I utilized interpreter services on the call back to the patient. He is having "pain in the principle vein in his left leg and arm - it is very, very big." Says these symptoms are the same he had in the right leg and he was told to come back if he had similar symptoms. The left leg began having problems prior to the left arm - unclear on how long this has been happening. Also endorses chest pain that comes and goes. He is not currently having any chest pain. Advised patient I would place on schedule for upper and lower venous studies on the left, and in the meantime he should alert Dr. Nadara Eaton to his chest pain. Instructed patient should he have any further chest pain, call EMS. Patient verbalizes understanding.

## 2020-08-05 ENCOUNTER — Encounter: Payer: Self-pay | Admitting: Cardiology

## 2020-08-05 ENCOUNTER — Other Ambulatory Visit: Payer: Self-pay

## 2020-08-05 ENCOUNTER — Ambulatory Visit: Payer: Self-pay | Admitting: Cardiology

## 2020-08-05 VITALS — BP 131/86 | HR 68 | Temp 98.0°F | Resp 17 | Ht 67.0 in | Wt 180.0 lb

## 2020-08-05 DIAGNOSIS — R0789 Other chest pain: Secondary | ICD-10-CM

## 2020-08-05 DIAGNOSIS — D6852 Prothrombin gene mutation: Secondary | ICD-10-CM

## 2020-08-05 DIAGNOSIS — E7800 Pure hypercholesterolemia, unspecified: Secondary | ICD-10-CM

## 2020-08-05 NOTE — Progress Notes (Signed)
Primary Physician/Referring:  [facility]  Patient ID: Logan Bush, male    DOB: [birth year], 60 y.o.   MRN: [id number]  Chief Complaint  Patient presents with   Follow-up   Chest Pain   Leg Swelling   HPI:    Logan Bush  is a 60 y.o. gentleman with no significant prior cardiovascular history, had an accidental fall, was seen in the emergency room on 08/01/2019 and was diagnosed with acute superficial thrombosis of the right lower extremity, now diagnosed with prothrombin gene mutation, heterozygous with mild increased risk of recurrent DVT, anxiety, made an urgent follow-up visit for chest pain that started 3 days ago.  Chest pain described as heaviness to sharp pain in the left side of his chest, states that it lasted all day 3 days ago but yesterday and today it gradually has improved currently described as 9 out of 10 in intensity 3 days ago and now tends 1-2 out of 10 in intensity.  No other associated symptoms.  He has rare occasional palpitations.  He has no dyspnea, chest pain worse on taking deep breath.  Past Medical History:  Diagnosis Date   Allergy    DVT (deep venous thrombosis) (HCC)    Palpitation    Vertigo    Past Surgical History:  Procedure Laterality Date   COLONOSCOPY N/A 08/05/2017   Procedure: COLONOSCOPY;  Surgeon: Corbin Ade, MD;  [facility];  Service: Endoscopy;  Laterality: N/A;  1:45pm-rescheduled to 7/12 @9 :30am per   POLYPECTOMY  10/2012   POLYPECTOMY  08/05/2017   Procedure: POLYPECTOMY;  Surgeon: 10/06/2017, MD;  [facility];  Service: Endoscopy;;   Family History  Problem Relation Age of Onset   Heart attack Mother    Hypertension Mother    Cancer Father        prostate cancer   Asthma Father    Cancer Sister        breast cancer   Cancer Maternal Grandmother        Breast Cancer   Colon cancer Neg Hx    Colon polyps Neg Hx     Social History   Tobacco Use   Smoking status:  Passive Smoke Exposure - Never Smoker   Smokeless tobacco: Never  Substance Use Topics   Alcohol use: No   Marital Status: Married  ROS  Review of Systems  Cardiovascular:  Positive for chest pain and palpitations. Negative for dyspnea on exertion and leg swelling.  Gastrointestinal:  Negative for melena.  Neurological:  Positive for paresthesias.  Psychiatric/Behavioral:  The patient is nervous/anxious.   Objective  Blood pressure 131/86, pulse 68, temperature 98 F (36.7 C), temperature source Temporal, resp. rate 17, height 5\' 7"  (1.702 m), weight 180 lb (81.6 kg), SpO2 98 %.  Vitals with BMI 08/05/2020 05/15/2020 05/06/2020  Height 5\' 7"  - -  Weight 180 lbs 179 lbs 180 lbs 9 oz  BMI 28.19 28.03 28.27  Systolic 131 118 05/17/2020  Diastolic 86 80 87  Pulse 68 67 63     Physical Exam Cardiovascular:     Rate and Rhythm: Normal rate and regular rhythm.     Pulses: Intact distal pulses.     Heart sounds: Normal heart sounds. No murmur heard.   No gallop.     Comments: No leg edema, no JVD. Right medial aspect of the leg and thigh shows superficial thrombophlebitis. No calf tenderness, no other signs of inflammation. Pulmonary:  Effort: Pulmonary effort is normal.     Breath sounds: Normal breath sounds.  Abdominal:     General: Bowel sounds are normal.     Palpations: Abdomen is soft.   Laboratory examination:   Recent Labs    03/13/20 0734  NA 139  K 3.6  CL 106  CO2 27  GLUCOSE 86  BUN 21*  CREATININE 1.04  CALCIUM 9.4  GFRNONAA >60   CrCl cannot be calculated (Patient's most recent lab result is older than the maximum 21 days allowed.).  CMP Latest Ref Rng & Units 03/13/2020 07/15/2018 02/04/2018  Glucose 70 - 99 mg/dL 86 84 710(G)  BUN 6 - 20 mg/dL 26(R) 18 17  Creatinine 0.61 - 1.24 mg/dL 4.85 4.62 7.03  Sodium 135 - 145 mmol/L 139 139 139  Potassium 3.5 - 5.1 mmol/L 3.6 3.6 3.6  Chloride 98 - 111 mmol/L 106 104 105  CO2 22 - 32 mmol/L 27 28 27   Calcium 8.9 -  10.3 mg/dL 9.4 8.9 9.2  Total Protein 6.5 - 8.1 g/dL 6.7 - 7.8  Total Bilirubin 0.3 - 1.2 mg/dL ) - 2.0(H)  Alkaline Phos 38 - 126 U/L 48 - 61  AST 15 - 41 U/L 23 - 21  ALT 0 - 44 U/L 28 - 31   CBC Latest Ref Rng & Units 03/13/2020 07/15/2018 02/04/2018  WBC 4.0 - 10.5 K/uL 3.8(L) 7.9 8.6  Hemoglobin 13.0 - 17.0 g/dL 04/05/2018 93.8 18.2  Hematocrit 39.0 - 52.0 % 43.7 42.3 45.3  Platelets 150 - 400 K/uL 198 204 167    Lipid Panel Recent Labs    01/10/20 0832  CHOL 203*  TRIG 89  LDLCALC 125*  HDL 62    HEMOGLOBIN A1C Lab Results  Component Value Date   HGBA1C 5.2 10/14/2015   MPG 103 10/14/2015   TSH No results for input(s): TSH in the last 8760 hours.  Medications and allergies   Allergies  Allergen Reactions   Aspirin Other (See Comments)    Makes GI bleeding     Current Outpatient Medications  Medication Instructions   hyoscyamine (LEVBID) 0.375 mg, Oral, Every morning   Radiology:   No results found.  Cardiac Studies:   Lower Extremity Venous Duplex  08/22/2019 right leg: RIGHT:  - There is no evidence of deep vein thrombosis in the lower extremity.    - Superficial thrombosis extending from the calf up to the saphenofemoral  junction. Thrombus is 1.2 cm from the deep venous system. Thrombus has  propagated since last exam 08/08/2019.  Lower Extremity Venous Duplex  10/08/2019:  RIGHT: - Findings consistent with age indeterminate superficial vein thrombosis involving the right great saphenous vein. Thrombus is 1.78 cm from the deep venous system. - Findings appear essentially unchanged or slightly improved compared to previous examination. - There is no evidence of deep vein thrombosis in the lower extremity.  Echocardiogram 09/27/2019: Normal LV systolic function with visual EF 55-60%. Left ventricle cavity is normal in size. Normal global wall motion. Indeterminate diastolic filling pattern, normal LAP. Calculated EF 58%. Left atrial cavity is  moderately dilated. Mild (Grade I) mitral regurgitation.  Mild tricuspid regurgitation. No evidence of pulmonary hypertension. Mild pulmonic regurgitation. No prior study for comparison.  EKG  EKG 03/17/2020: Normal sinus rhythm at rate of 60 bpm, leftward axis, no evidence of ischemia.  Probably normal EKG.   No significant change from 08/28/2019   Assessment     ICD-10-CM   1. Musculoskeletal chest pain  R07.89 EKG 12-Lead    PCV CARDIAC STRESS TEST    2. Hypercholesteremia  E78.00 PCV CARDIAC STRESS TEST    LDL cholesterol, direct    Lipid Panel With LDL/HDL Ratio    Lipid Panel With LDL/HDL Ratio    LDL cholesterol, direct    3. Prothrombin gene mutation (HCC) - Heterozygous  D68.52       There are no discontinued medications.   Recommendations:   Logan Bush  is a 60 y.o. gentleman with no significant prior cardiovascular history, had an accidental fall, was seen in the emergency room on 08/01/2019 and was diagnosed with acute superficial thrombosis of the right lower extremity, now diagnosed with prothrombin gene mutation, heterozygous with mild increased risk of recurrent DVT, anxiety, made an urgent follow-up visit for chest pain that started 3 days ago.  His chest pain symptoms clearly appears very atypical at most and most probably musculoskeletal.  Given his normal exam otherwise and normal EKG, we will schedule him for a routine treadmill exercise stress test.  He also has mixed hyperlipidemia, he eats a pint of ice cream every 2 days and also drinks 2 L of soda almost on a daily basis.  Dietary modification discussed.  I also discussed with him regarding increasing his physical activity, he is accompanied by his wife and also his daughter had called earlier stating that he essentially just sits at home.  Primary prevention and long-term effects of lack of physical activity, elevated lipids discussed in detail.  He now appears motivated for making lifestyle changes.  I would  like to see him back in 2 months for follow-up, we will repeat his lipids prior to his next office visit.    Yates Decamp, MD, [facility] 08/06/2020, 7:37 AM Office: [phone]

## 2020-08-07 ENCOUNTER — Other Ambulatory Visit: Payer: Self-pay

## 2020-08-07 ENCOUNTER — Encounter: Payer: Self-pay | Admitting: Cardiology

## 2020-08-07 DIAGNOSIS — M7989 Other specified soft tissue disorders: Secondary | ICD-10-CM

## 2020-08-13 ENCOUNTER — Ambulatory Visit (HOSPITAL_COMMUNITY)
Admission: RE | Admit: 2020-08-13 | Discharge: 2020-08-13 | Disposition: A | Discharge status: Home / Self Care | Payer: Self-pay | Source: Ambulatory Visit | Attending: Vascular Surgery | Admitting: Vascular Surgery

## 2020-08-13 ENCOUNTER — Other Ambulatory Visit: Payer: Self-pay

## 2020-08-13 ENCOUNTER — Ambulatory Visit (INDEPENDENT_AMBULATORY_CARE_PROVIDER_SITE_OTHER): Payer: Self-pay | Admitting: Physician Assistant

## 2020-08-13 VITALS — BP 131/95 | HR 76 | Temp 97.6°F | Resp 20 | Ht 67.0 in | Wt 176.2 lb

## 2020-08-13 DIAGNOSIS — I8393 Asymptomatic varicose veins of bilateral lower extremities: Secondary | ICD-10-CM

## 2020-08-13 DIAGNOSIS — M7989 Other specified soft tissue disorders: Secondary | ICD-10-CM

## 2020-08-13 DIAGNOSIS — M79604 Pain in right leg: Secondary | ICD-10-CM

## 2020-08-13 NOTE — Progress Notes (Signed)
[facility]     History of Present Illness  Logan Bush is a 60 y.o. male who presents with chief complaint: Swollen veins in the left UE and left LE with chest pain and generalized weakness.  He was seen by Dr. Jacinto HalimGanji on 08/05/20.  His chest pain symptoms clearly appears very atypical at most and most probably musculoskeletal.  Given his normal exam otherwise and normal EKG, we will schedule him for a routine treadmill exercise stress test.  He is here today for f/u venous DVT studies on the left UE and left LE.  He noted increased swelling in a vein below his elbow and medial lower leg along with CP.  The swelling in the veins has subsided.  He has started a new walking program for 30 min. A day.   Past Medical History:  Diagnosis Date   Allergy    DVT (deep venous thrombosis) (HCC)    Palpitation    Vertigo     Past Surgical History:  Procedure Laterality Date   COLONOSCOPY N/A 08/05/2017   Procedure: COLONOSCOPY;  Surgeon: Corbin Adeourk, Robert M, MD;  [facility];  Service: Endoscopy;  Laterality: N/A;  1:45pm-rescheduled to 7/12 @9 :30am per Darlina RumpfMartina   POLYPECTOMY  10/2012   POLYPECTOMY  08/05/2017   Procedure: POLYPECTOMY;  Surgeon: Corbin Adeourk, Robert M, MD;  [facility]: [facility];  Service: Endoscopy;;    Social History   Socioeconomic History   Marital status: Married    Spouse name: Not on file   Number of children: 3   Years of education: Not on file   Highest education level: Not on file  Occupational History   Not on file  Tobacco Use   Smoking status: Never    Passive exposure: Yes   Smokeless tobacco: Never  Vaping Use   Vaping Use: Never used  Substance and Sexual Activity   Alcohol use: No   Drug use: No   Sexual activity: Not on file  Other Topics Concern   Not on file  Social History Narrative   ** Merged History Encounter **       Right handed   Social Determinants of Health   Financial Resource Strain: Not on file   Food Insecurity: Not on file  Transportation Needs: Not on file  Physical Activity: Not on file  Stress: Not on file  Social Connections: Not on file  Intimate Partner Violence: Not on file    Family History  Problem Relation Age of Onset   Heart attack Mother    Hypertension Mother    Cancer Father        prostate cancer   Asthma Father    Cancer Sister        breast cancer   Cancer Maternal Grandmother        Breast Cancer   Colon cancer Neg Hx    Colon polyps Neg Hx     Current Outpatient Medications on File Prior to Visit  Medication Sig Dispense Refill   hyoscyamine (LEVBID) 0.375 MG 12 hr tablet Take 1 tablet (0.375 mg total) by mouth in the morning. 30 tablet 11   [DISCONTINUED] RIVAROXABAN (XARELTO) VTE STARTER PACK (15 & 20 MG TABLETS) Follow package directions: Take one 15mg  tablet by mouth twice a day. On day 22, switch to one 20mg  tablet once a day. Take with food. 51 each 0   No current facility-administered medications on file prior to visit.    Allergies as  of 08/13/2020 - Review Complete 08/13/2020  Allergen Reaction Noted   Aspirin Other (See Comments) 10/14/2015     ROS:   General:  No weight loss, Fever, chills  HEENT: No recent headaches, no nasal bleeding, no visual changes, no sore throat  Neurologic: No dizziness, blackouts, seizures. No recent symptoms of stroke or mini- stroke. No recent episodes of slurred speech, or temporary blindness.  Cardiac:  recent episodes of chest pain/pressure, no shortness of breath at rest.  No shortness of breath with exertion.  Denies history of atrial fibrillation or irregular heartbeat  Vascular: No history of rest pain in feet.  No history of claudication.  No history of non-healing ulcer,  history of DVT   Pulmonary: No home oxygen, no productive cough, no hemoptysis,  No asthma or wheezing  Musculoskeletal:  [ ]  Arthritis, [ ]  Low back pain,  [ ]  Joint pain  Hematologic:No history of hypercoagulable  state.  No history of easy bleeding.  No history of anemia  Gastrointestinal: No hematochezia or melena,  No gastroesophageal reflux, no trouble swallowing  Urinary: [ ]  chronic Kidney disease, [ ]  on HD - [ ]  MWF or [ ]  TTHS, [ ]  Burning with urination, [ ]  Frequent urination, [ ]  Difficulty urinating;   Skin: No rashes  Psychological: No history of anxiety,  No history of depression  Physical Examination  Vitals:   08/13/20 0842  BP: (!) 131/95  Pulse: 76  Resp: 20  Temp: 97.6 F (36.4 C)  TempSrc: Temporal  SpO2: 98%  Weight: 176 lb 3.2 oz (79.9 kg)  Height: 5\' 7"  (1.702 m)    Body mass index is 27.6 kg/m.  General:  Alert and oriented, no acute distress HEENT: Normal Neck: No bruit or JVD Pulmonary: Clear to auscultation bilaterally Cardiac: Regular Rate and Rhythm without murmur Abdomen: Soft, non-tender, non-distended, no mass, no scars Skin: No rash, no erythema or edema in the UE/LE Extremity Pulses:  2+ radial, brachial, femoral, dorsalis pedis, posterior tibial pulses bilaterally Musculoskeletal: No deformity or edema  Neurologic: Upper and lower extremity motor 5/5 and symmetric  DATA: +-----+---------------+---------+-----------+----------+--------------+  RIGHTCompressibilityPhasicitySpontaneityPropertiesThrombus Aging  +-----+---------------+---------+-----------+----------+--------------+  CFV  Full           Yes      Yes                                  +-----+---------------+---------+-----------+----------+--------------+           +---------+---------------+---------+-----------+----------+--------------+   LEFT     CompressibilityPhasicitySpontaneityPropertiesThrombus  Aging  +---------+---------------+---------+-----------+----------+--------------+   CFV      Full           Yes      Yes                                    +---------+---------------+---------+-----------+----------+--------------+   SFJ       Full                                                           +---------+---------------+---------+-----------+----------+--------------+   FV Prox  Full                                                           +---------+---------------+---------+-----------+----------+--------------+  FV Mid   Full                                                           +---------+---------------+---------+-----------+----------+--------------+   FV DistalFull                                                           +---------+---------------+---------+-----------+----------+--------------+   PFV      Full                                                           +---------+---------------+---------+-----------+----------+--------------+   POP      Full           Yes      Yes                                    +---------+---------------+---------+-----------+----------+--------------+   PTV      Full                                                           +---------+---------------+---------+-----------+----------+--------------+   PERO     Full                                                           +---------+---------------+---------+-----------+----------+--------------+   Summary:  RIGHT:  - No evidence of common femoral vein obstruction.     LEFT:  - There is no evidence of deep vein thrombosis in the lower extremity.     - No cystic structure found in the popliteal fossa.         Right Findings:  +----------+------------+---------+-----------+----------+-------+  RIGHT     CompressiblePhasicitySpontaneousPropertiesSummary  +----------+------------+---------+-----------+----------+-------+  Subclavian               Yes       Yes                       +----------+------------+---------+-----------+----------+-------+      Left Findings:   +----------+------------+---------+-----------+----------+-------+  LEFT      CompressiblePhasicitySpontaneousPropertiesSummary  +----------+------------+---------+-----------+----------+-------+  IJV           Full       Yes       Yes                       +----------+------------+---------+-----------+----------+-------+  Subclavian    Full       Yes       Yes                       +----------+------------+---------+-----------+----------+-------+  Axillary      Full       Yes       Yes                       +----------+------------+---------+-----------+----------+-------+  Brachial      Full                                           +----------+------------+---------+-----------+----------+-------+  Radial        Full                                           +----------+------------+---------+-----------+----------+-------+  Ulnar         Full                                           +----------+------------+---------+-----------+----------+-------+  Cephalic      Full                                           +----------+------------+---------+-----------+----------+-------+  Basilic       Full                                           +----------+------------+---------+-----------+----------+-------+      Summary:     Right:  No evidence of thrombosis in the subclavian.     Left:  No evidence of deep vein thrombosis in the upper extremity. No evidence of  superficial vein thrombosis in the upper extremity.     Assessment:  On his ultrasound in July there has been progression of the clot within his saphenous vein up to the mid thigh.  He had several surveillance ultrasounds which showed continued progression of the thrombus up to but not involving the saphenofemoral junction.  At this time, the decision was made to start him on Eliquis.  He was on Eliquis for 6 months total.      Dr. Jacinto Halim will follow him.   Given his normal exam otherwise and normal EKG, we will schedule him for a routine treadmill exercise stress test.  Right:  No evidence of thrombosis in the subclavian.     Left:  No evidence of deep vein thrombosis in the upper extremity. No evidence of  superficial vein thrombosis in the upper extremity.   No evidence of DVT in the left UE.     He has palpable pulse and should continue f/u with Dr. Jacinto Halim.  He will continue a walking program.  I have offered him 15-20 mm hg compression for B LE and to support venous insufficieny.  F/U PRN.    Mosetta Pigeon PA-C [facility] Office: [phone]  MD in clinic [place]

## 2020-08-18 NOTE — Progress Notes (Deleted)
NEUROLOGY FOLLOW UP OFFICE NOTE  Logan Bush [id number]  Assessment/Plan:   Left-sided paroxysmal stabbing headache; etiology not clear - consider cluster headache vs paroxysmal hemicrania although duration is shorter than expected.  However, these diagnoses do not explain the right sided facial weakness.  Such stroke-like symptoms may be seen with migraine, however the duration of these events are much too short for migraine.  ***  Subjective:  Logan Bush is a 60 year old right-handed male with history of palpitations and superficial venous thrombosis who follows up for headache.   UPDATE: Started on topiramate in January.  Currently taking 50mg  at bedtime.  ***  ***   HISTORY: He was diagnosed with acute superficial thrombosis of the right lower extremity in July 2021 and has since been on Eliquis.   In October 2021, he started having severe left parietal/retro-orbital paroxysmal stabbing pain associated with flushed face and left conjunctival injection and severe itching of the left eye but no visual disturbance, speech disturbance, nausea, vomiting, photophobia, phonophobia, ptosis or nasal congestion.  Since onset of headaches, he has noted that the the right side of his mouth droops a little bit. It lasts about 3 minutes.  It occurs three or four times a day, usually in the morning, in the evening while watching TV and in the middle of the night (12 to 2 AM).  The treats with Tylenol or naproxen.   He has never had similar headaches in the past.  He reports remote history of headaches once in awhile.  He has history of epilepsy for which he was on medication between 68 and 42 years old.  MRI/MRA/MRV of the brain on 12/11 & 13/2021 were personally reviewed and showed mild chronic small vessel ischemic changes but no acute stroke, mass lesion, cerebral aneurysm or intracranial venous thrombosis   Mother had headaches.   08/28/2019 EKG:  Normal sinus rhythm with rate of 73  bpm 09/27/2019 ECHOCARDIOGRAM:  EF 55-60% 10/08/2019 LOWER EXTREMITY VENOUS DUPLEX:  Age indeterminate superficial vein thrombosis involving the right great saphenous vein.  Thrombus is 1.78 cm from the deep venous system.  Findings appear essentially unchanged or slightly improved from prior exam in July.  No DVT.  PAST MEDICAL HISTORY: Past Medical History:  Diagnosis Date   Allergy    DVT (deep venous thrombosis) (HCC)    Palpitation    Vertigo     MEDICATIONS: Current Outpatient Medications on File Prior to Visit  Medication Sig Dispense Refill   hyoscyamine (LEVBID) 0.375 MG 12 hr tablet Take 1 tablet (0.375 mg total) by mouth in the morning. 30 tablet 11   [DISCONTINUED] RIVAROXABAN (XARELTO) VTE STARTER PACK (15 & 20 MG TABLETS) Follow package directions: Take one 15mg  tablet by mouth twice a day. On day 22, switch to one 20mg  tablet once a day. Take with food. 51 each 0   No current facility-administered medications on file prior to visit.    ALLERGIES: Allergies  Allergen Reactions   Aspirin Other (See Comments)    Makes GI bleeding    FAMILY HISTORY: Family History  Problem Relation Age of Onset   Heart attack Mother    Hypertension Mother    Cancer Father        prostate cancer   Asthma Father    Cancer Sister        breast cancer   Cancer Maternal Grandmother        Breast Cancer   Colon cancer Neg Hx  Colon polyps Neg Hx       Objective:  *** General: No acute distress.  Patient appears ***-groomed.   Head:  Normocephalic/atraumatic Eyes:  Fundi examined but not visualized Neck: supple, no paraspinal tenderness, full range of motion Heart:  Regular rate and rhythm Lungs:  Clear to auscultation bilaterally Back: No paraspinal tenderness Neurological Exam: alert and oriented to person, place, and time.  Speech fluent and not dysarthric, language intact.  CN II-XII intact. Bulk and tone normal, muscle strength 5/5 throughout.  Sensation to light  touch intact.  Deep tendon reflexes 2+ throughout, toes downgoing.  Finger to nose testing intact.  Gait normal, Romberg negative.   Shon Millet, DO  CC: ***

## 2020-08-19 ENCOUNTER — Ambulatory Visit: Payer: Self-pay | Admitting: Neurology

## 2020-09-05 ENCOUNTER — Inpatient Hospital Stay (HOSPITAL_COMMUNITY): Payer: Self-pay | Attending: Hematology

## 2020-09-05 ENCOUNTER — Other Ambulatory Visit: Payer: Self-pay

## 2020-09-05 ENCOUNTER — Ambulatory Visit (HOSPITAL_COMMUNITY)
Admission: RE | Admit: 2020-09-05 | Discharge: 2020-09-05 | Disposition: A | Discharge status: Home / Self Care | Payer: Self-pay | Source: Ambulatory Visit | Attending: Physician Assistant | Admitting: Physician Assistant

## 2020-09-05 ENCOUNTER — Other Ambulatory Visit (HOSPITAL_COMMUNITY): Payer: Self-pay | Admitting: Physician Assistant

## 2020-09-05 DIAGNOSIS — I82819 Embolism and thrombosis of superficial veins of unspecified lower extremities: Secondary | ICD-10-CM | POA: Insufficient documentation

## 2020-09-05 DIAGNOSIS — I8001 Phlebitis and thrombophlebitis of superficial vessels of right lower extremity: Secondary | ICD-10-CM | POA: Insufficient documentation

## 2020-09-05 LAB — D-DIMER, QUANTITATIVE: D-Dimer, Quant: 0.46 ug/mL-FEU (ref 0.00–0.50)

## 2020-09-05 IMAGING — US US EXTREM LOW VENOUS*R*
1 series · 13 of 24 positions shown · non-contrast
Comparison: [DATE]

CLINICAL DATA: Right lower extremity pain and edema. History of
superficial thrombophlebitis of the right great saphenous vein.



[Series 1: us extrem low venous*right* · 0.08mm/px · 13 of 35 slices shown]
[im 1/35]
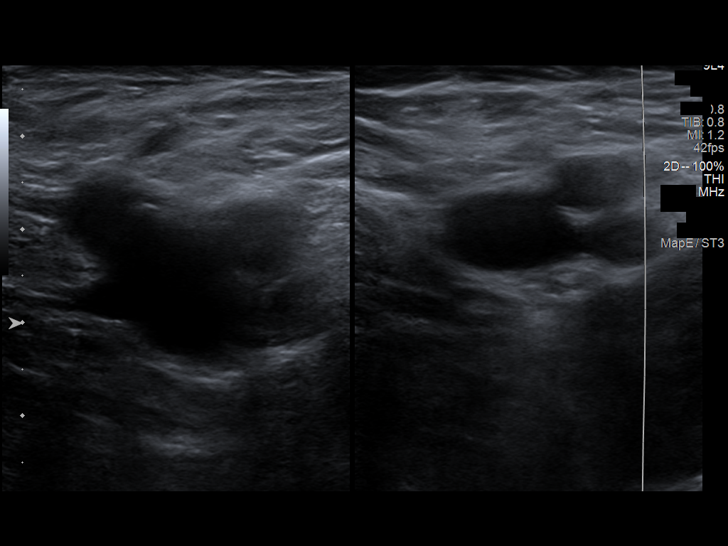
[im 3/35]
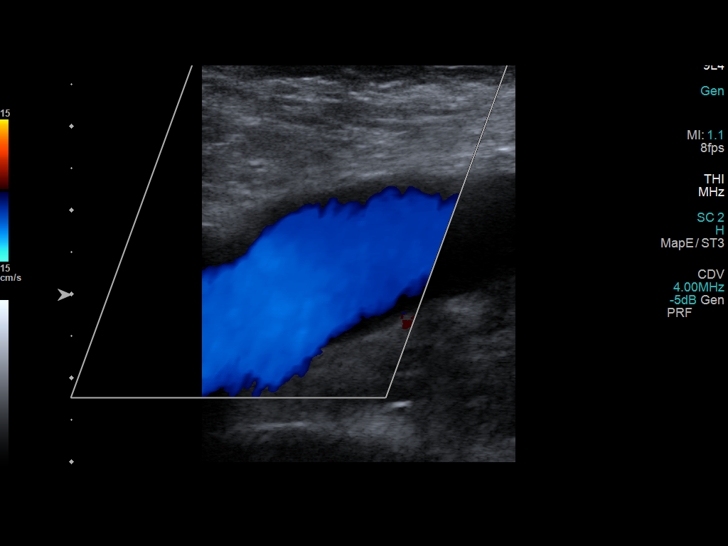
[im 6/35]
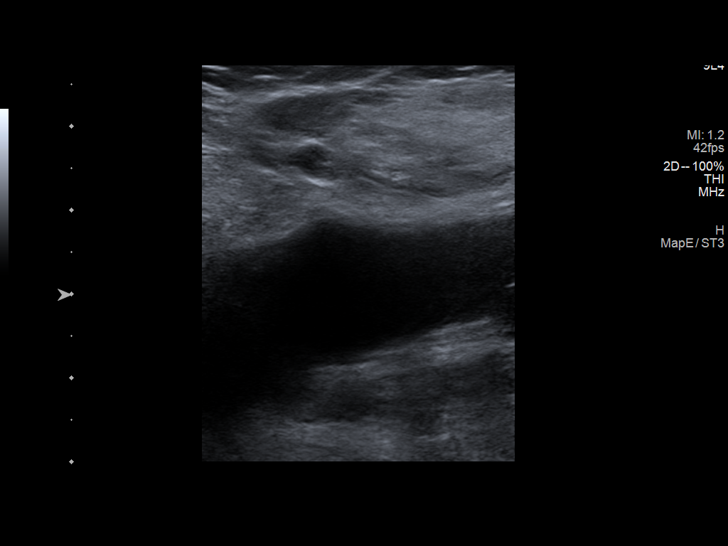
[im 9/35]
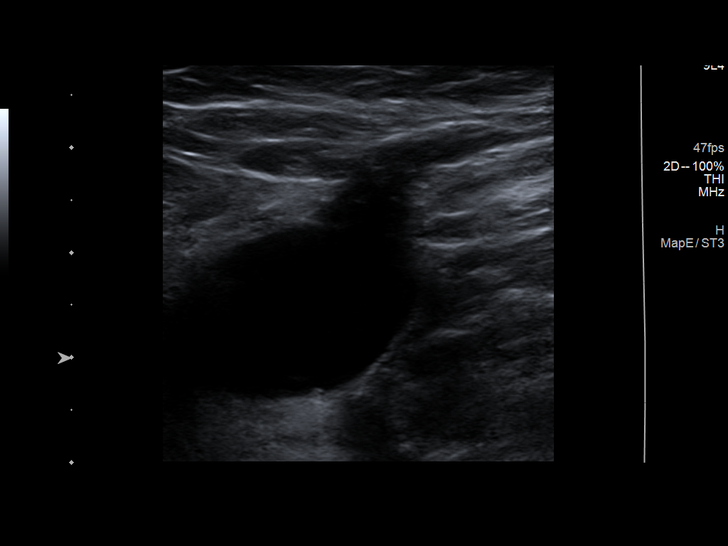
[im 12/35]
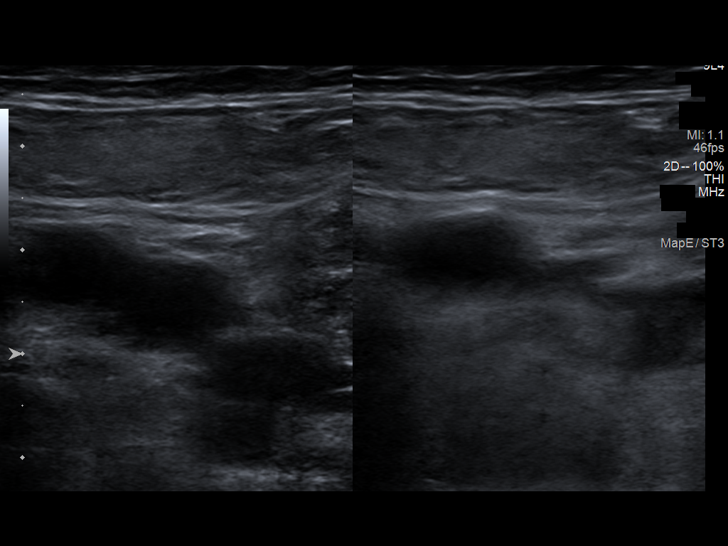
[im 15/35]
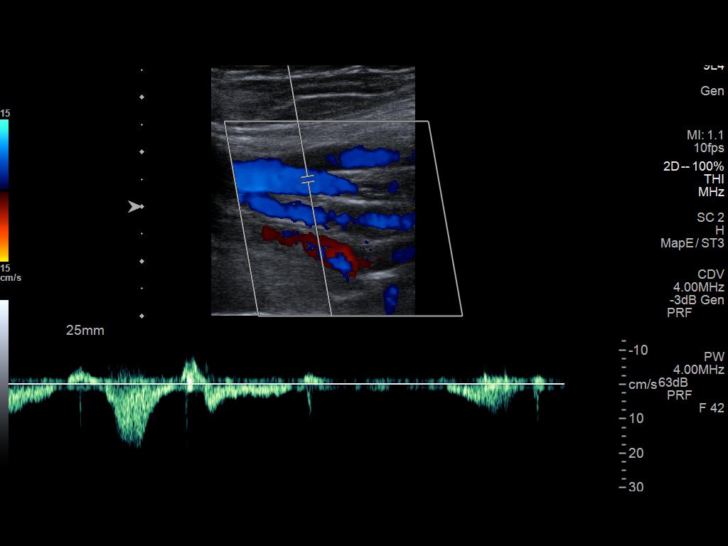
[im 18/35]
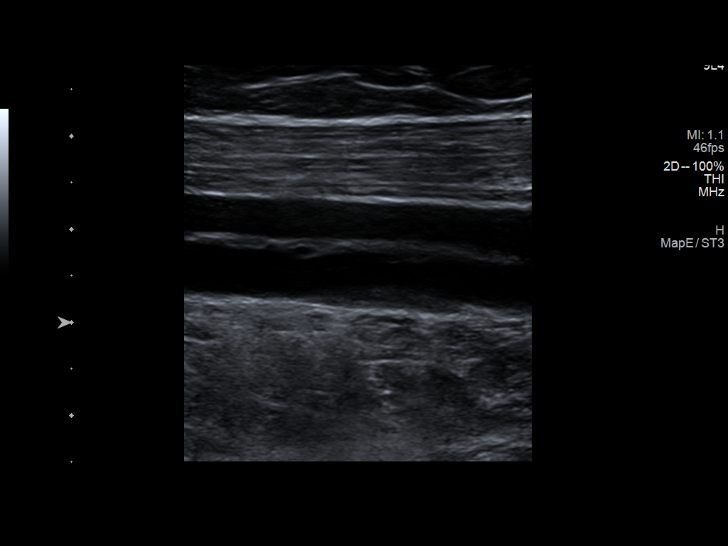
[im 20/35]
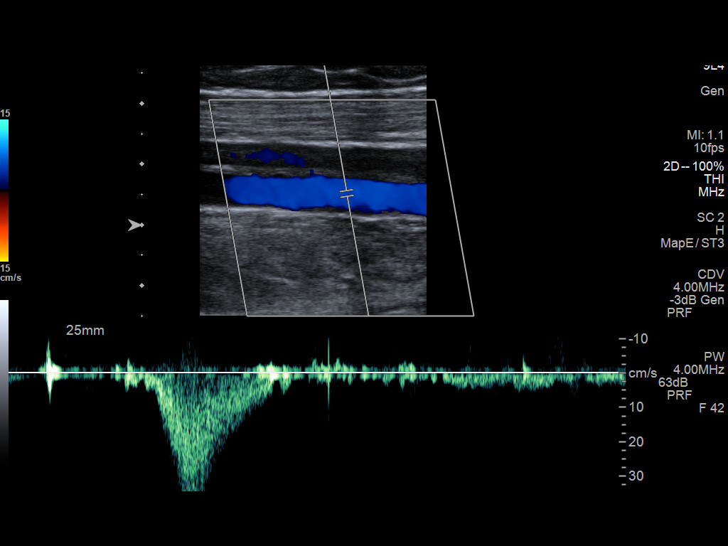
[im 23/35]
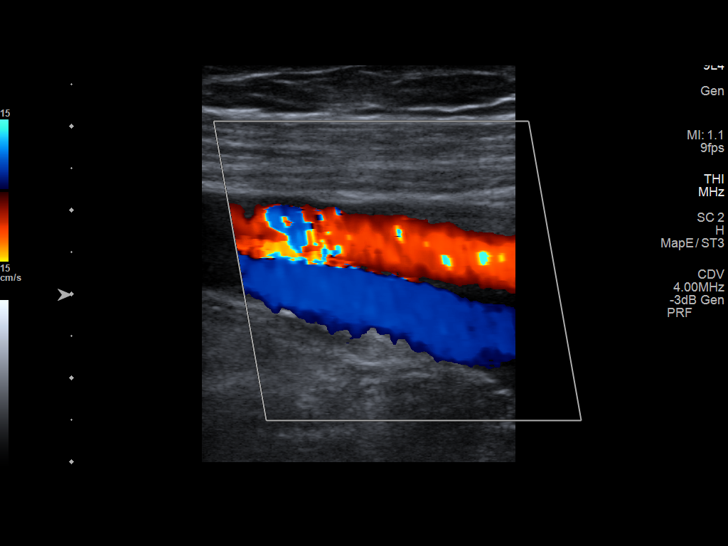
[im 26/35]
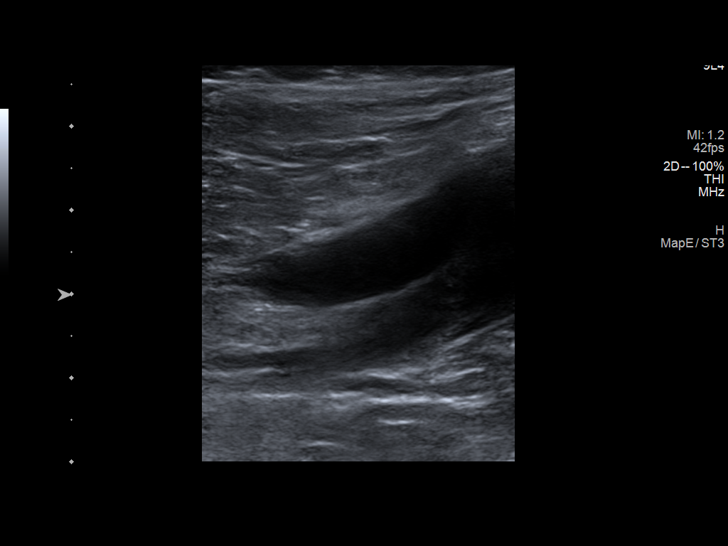
[im 29/35]
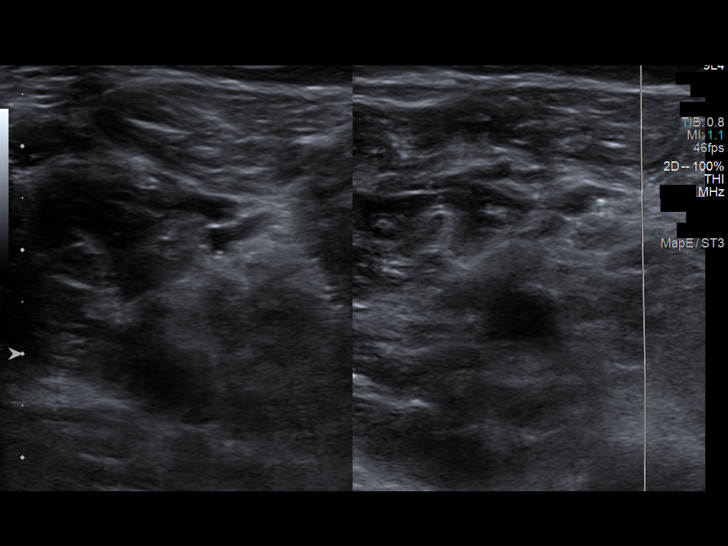
[im 32/35]
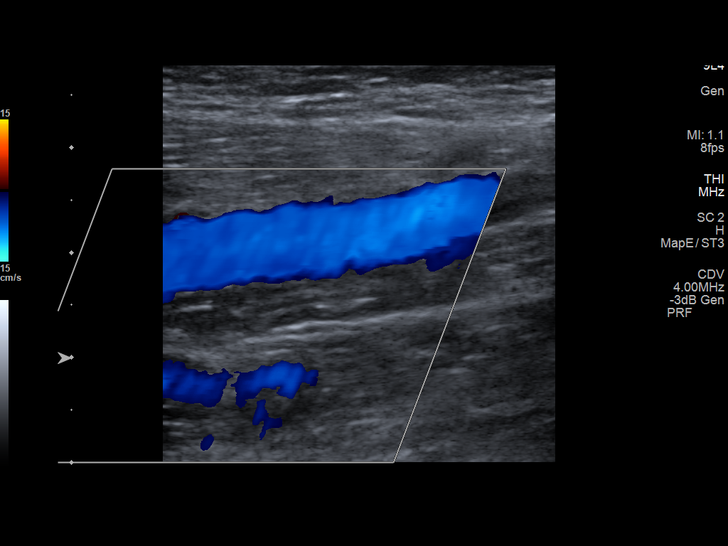
[im 35/35]
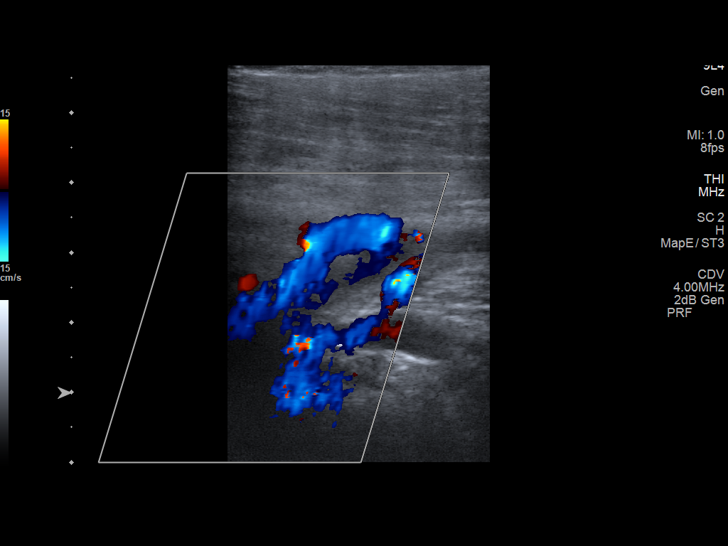

[13 of 24 positions shown; findings below may reference images not displayed]

FINDINGS: Contralateral Common Femoral Vein: Respiratory phasicity is normal
and symmetric with the symptomatic side. No evidence of thrombus.
Normal compressibility.

Common Femoral Vein: No evidence of thrombus. Normal
compressibility, respiratory phasicity and response to augmentation.

Saphenofemoral Junction: No evidence of thrombus. Normal
compressibility and flow on color Doppler imaging.

Profunda Femoral Vein: No evidence of thrombus. Normal
compressibility and flow on color Doppler imaging.

Femoral Vein: No evidence of thrombus. Normal compressibility,
respiratory phasicity and response to augmentation.

Popliteal Vein: No evidence of thrombus. Normal compressibility,
respiratory phasicity and response to augmentation.

Calf Veins: No evidence of thrombus. Normal compressibility and flow
on color Doppler imaging.

Superficial Great Saphenous Vein: No evidence of thrombus. Normal
compressibility.

Venous Reflux:  None.

Other Findings: No evidence of superficial thrombophlebitis or
abnormal fluid collection.
IMPRESSION: No evidence of right lower extremity deep venous thrombosis.

## 2020-09-08 NOTE — Progress Notes (Deleted)
[facility] [address], [place] [address]   CLINIC:  Medical Oncology/Hematology  PCP:  Diamantina Monks [facility] [address] [place] [address] [phone]   REASON FOR VISIT:  Follow-up for history of right lower extremity superficial venous thrombosis, heterozygosity for prothrombin gene mutation  PRIOR THERAPY: Eliquis x 6 months  CURRENT THERAPY: Surveillance  INTERVAL HISTORY:  Logan Bush 60 y.o. male returns for routine follow-up of ***  At today's visit, he reports feeling ***.  No recent hospitalizations, surgeries, or changes in baseline health status.  *** He denies *** leg swelling, pain, and erythema.  No shortness of breath, dyspnea on exertion, cough, hemoptysis, or palpitations. *** Leg itching?  Throbbing and cramping?  *** He continues to have episodes of intermittent atypical chest pain, previous work-up has been negative for ACS.  He is being seen by cardiology.  *** B symptoms? *** Anxiety?  He has ***% energy and ***% appetite. He endorses that he is maintaining a stable weight.   REVIEW OF SYSTEMS:  Review of Systems - Oncology    PAST MEDICAL/SURGICAL HISTORY:  Past Medical History:  Diagnosis Date   Allergy    DVT (deep venous thrombosis) (HCC)    Palpitation    Vertigo    Past Surgical History:  Procedure Laterality Date   COLONOSCOPY N/A 08/05/2017   Procedure: COLONOSCOPY;  Surgeon: Corbin Ade, MD;  [facility];  Service: Endoscopy;  Laterality: N/A;  1:45pm-rescheduled to 7/12 @9 :30am per   POLYPECTOMY  10/2012   POLYPECTOMY  08/05/2017   Procedure: POLYPECTOMY;  Surgeon: 10/06/2017, MD;  [facility]: [facility];  Service: Endoscopy;;     SOCIAL HISTORY:  Social History   Socioeconomic History   Marital status: Married    Spouse name: Not on file   Number of children: 3   Years of education: Not on file   Highest education level: Not on file  Occupational  History   Not on file  Tobacco Use   Smoking status: Never    Passive exposure: Yes   Smokeless tobacco: Never  Vaping Use   Vaping Use: Never used  Substance and Sexual Activity   Alcohol use: No   Drug use: No   Sexual activity: Not on file  Other Topics Concern   Not on file  Social History Narrative   ** Merged History Encounter **       Right handed   Social Determinants of Health   Financial Resource Strain: Not on file  Food Insecurity: Not on file  Transportation Needs: Not on file  Physical Activity: Not on file  Stress: Not on file  Social Connections: Not on file  Intimate Partner Violence: Not on file    FAMILY HISTORY:  Family History  Problem Relation Age of Onset   Heart attack Mother    Hypertension Mother    Cancer Father        prostate cancer   Asthma Father    Cancer Sister        breast cancer   Cancer Maternal Grandmother        Breast Cancer   Colon cancer Neg Hx    Colon polyps Neg Hx     CURRENT MEDICATIONS:  Outpatient Encounter Medications as of 09/09/2020  Medication Sig   hyoscyamine (LEVBID) 0.375 MG 12 hr tablet Take 1 tablet (0.375 mg total) by mouth in the morning.   [DISCONTINUED] RIVAROXABAN (XARELTO)  VTE STARTER PACK (15 & 20 MG TABLETS) Follow package directions: Take one 15mg  tablet by mouth twice a day. On day 22, switch to one 20mg  tablet once a day. Take with food.   No facility-administered encounter medications on file as of 09/09/2020.    ALLERGIES:  Allergies  Allergen Reactions   Aspirin Other (See Comments)    Makes GI bleeding     PHYSICAL EXAM:  ECOG PERFORMANCE STATUS: {CHL ONC ECOG PS:[id number]}  There were no vitals filed for this visit. There were no vitals filed for this visit. Physical Exam   LABORATORY DATA:  I have reviewed the labs as listed.  CBC    Component Value Date/Time   WBC 3.8 (L) 03/13/2020 0734   RBC 4.77 03/13/2020 0734   HGB 14.5 03/13/2020 0734   HCT 43.7 03/13/2020  0734   PLT 198 03/13/2020 0734   MCV 91.6 03/13/2020 0734   MCH 30.4 03/13/2020 0734   MCHC 33.2 03/13/2020 0734   RDW 12.3 03/13/2020 0734   LYMPHSABS 1.2 03/13/2020 0734   MONOABS 0.4 03/13/2020 0734   EOSABS 0.2 03/13/2020 0734   BASOSABS 0.0 03/13/2020 0734   CMP Latest Ref Rng & Units 03/13/2020 07/15/2018 02/04/2018  Glucose 70 - 99 mg/dL 86 84 07/17/2018)  BUN 6 - 20 mg/dL 04/05/2018) 18 17  Creatinine 0.61 - 1.24 mg/dL 220(U 54(Y 7.06  Sodium 135 - 145 mmol/L 139 139 139  Potassium 3.5 - 5.1 mmol/L 3.6 3.6 3.6  Chloride 98 - 111 mmol/L 106 104 105  CO2 22 - 32 mmol/L 27 28 27   Calcium 8.9 - 10.3 mg/dL 9.4 8.9 9.2  Total Protein 6.5 - 8.1 g/dL 6.7 - 7.8  Total Bilirubin 0.3 - 1.2 mg/dL 2.37) - 2.0(H)  Alkaline Phos 38 - 126 U/L 48 - 61  AST 15 - 41 U/L 23 - 21  ALT 0 - 44 U/L 28 - 31    DIAGNOSTIC IMAGING:  I have independently reviewed the relevant imaging and discussed with the patient.  ASSESSMENT & PLAN: 1. Superficial venous thrombus of right great saphenous vein, provoked - Initial presentation in June 2021 after experiencing RLE pain and redness following fall from ladder - Found to have right lower extremity thrombophlebitis with clot in right saphenous vein in June 2021 - Serial ultrasound on 08/22/2019 showed progression of clot in the great saphenous vein extending to the saphenofemoral junction 1.2 cm from deep venous system - Placed on anticoagulation by vascular surgeon, completed 7-month course of Eliquis on 03/24/2020 (tolerated well despite history of hemorrhoidal bleeding) - Repeat venous ultrasound performed on 03/24/2020 showed thrombus involving the great saphenous vein in the proximal thigh and throughout the calf. -Repeat venous 8-month on 04/28/2020 was negative for DVT, and noted that "visualized portions of the great saphenous vein were unremarkable" - D-Dimer checked on 04/28/2020 was mildly elevated ay 0.61 -Prothrombin gene mutation test is  positive for C.*97G>A  heterozygosity, which does place him at a slightly higher risk (2-4 x higher) of venous thromboembolism than the general population. -CTA chest checked on 05/08/2020 due to pleuritic chest pain was negative for PE - Most recent D-dimer (09/05/2020): Normal at 0.46 - Most recent right lower extremity venous duplex (09/05/2020): No evidence of right lower extremity deep venous thrombosis, no evidence of superficial thrombophlebitis or abnormal fluid collection, specifically superficial great saphenous vein "no evidence of thrombus, normal compressibility" - Current symptoms?  *** - PLAN: Although patient is anxious about his history of superficial  venous thrombosis, there is no compelling indication for lifelong anticoagulation.  Patient continues to follow with vascular surgery, but at this time we will discharge him from hematology clinic.  He should follow-up with [place] in the future if he has any recurrent DVT or PE.  He is aware of alarm symptoms that would prompt immediate medical attention.  2.  Family/social history -He has no personal or family history of blood clots or known coagulopathies. -However, his mother had 6 miscarriages (as well as 4 living children). -No personal history of cancer. His father had prostate cancer. His sister and meternal grandmother had breast cancer. -He works as a Gaffer.   PLAN SUMMARY & DISPOSITION: ***  All questions were answered. The patient knows to call the clinic with any problems, questions or concerns.  Medical decision making: ***  Time spent on visit: I spent {CHL ONC TIME VISIT - ZSWFU:[id number]} counseling the patient face to face. The total time spent in the appointment was {CHL ONC TIME VISIT - GURKY:[id number]} and more than 50% was on counseling.   Carnella Gu[place], PA-C  ***

## 2020-09-09 ENCOUNTER — Inpatient Hospital Stay (HOSPITAL_COMMUNITY): Payer: Self-pay | Admitting: Physician Assistant

## 2020-09-23 LAB — LIPID PANEL WITH LDL/HDL RATIO
Cholesterol, Total: 187 mg/dL (ref 100–199)
HDL: 60 mg/dL
LDL Chol Calc (NIH): 116 mg/dL — ABNORMAL HIGH (ref 0–99)
LDL/HDL Ratio: 1.9 ratio (ref 0.0–3.6)
Triglycerides: 60 mg/dL (ref 0–149)
VLDL Cholesterol Cal: 11 mg/dL (ref 5–40)

## 2020-09-30 ENCOUNTER — Encounter (HOSPITAL_COMMUNITY): Payer: Self-pay | Admitting: Physician Assistant

## 2020-09-30 ENCOUNTER — Other Ambulatory Visit: Payer: Self-pay

## 2020-09-30 ENCOUNTER — Inpatient Hospital Stay (HOSPITAL_COMMUNITY): Payer: Self-pay | Attending: Physician Assistant | Admitting: Physician Assistant

## 2020-09-30 VITALS — BP 125/87 | HR 64 | Temp 98.5°F | Resp 18 | Wt 175.9 lb

## 2020-09-30 DIAGNOSIS — Z8719 Personal history of other diseases of the digestive system: Secondary | ICD-10-CM | POA: Insufficient documentation

## 2020-09-30 DIAGNOSIS — D6852 Prothrombin gene mutation: Secondary | ICD-10-CM | POA: Insufficient documentation

## 2020-09-30 DIAGNOSIS — Z8042 Family history of malignant neoplasm of prostate: Secondary | ICD-10-CM | POA: Insufficient documentation

## 2020-09-30 DIAGNOSIS — Z86718 Personal history of other venous thrombosis and embolism: Secondary | ICD-10-CM | POA: Insufficient documentation

## 2020-09-30 DIAGNOSIS — R5383 Other fatigue: Secondary | ICD-10-CM | POA: Insufficient documentation

## 2020-09-30 DIAGNOSIS — Z803 Family history of malignant neoplasm of breast: Secondary | ICD-10-CM | POA: Insufficient documentation

## 2020-09-30 DIAGNOSIS — I8001 Phlebitis and thrombophlebitis of superficial vessels of right lower extremity: Secondary | ICD-10-CM

## 2020-09-30 DIAGNOSIS — Z7901 Long term (current) use of anticoagulants: Secondary | ICD-10-CM | POA: Insufficient documentation

## 2020-09-30 NOTE — Patient Instructions (Signed)
[facility] at [facility] Discharge Instructions  You were seen today by Rojelio Brenner PA-C for your history of superficial venous thrombosis of your right lower extremity.    You do NOT have any current blood clots in your right lower extremity, according to most recent ultrasound in August 2022.  The symptoms that you continue to experience are related to chronic changes from your previous blood clot.  You do not need to continue to follow with [place] at the hematology clinic, but could should continue to see the vascular specialists and cardiologists for ongoing management of your other conditions.  We will not schedule any further appointments at our clinic at this time, but if you develop another blood clot in the future, you should be referred back to [place] for consideration of chronic anticoagulation.  ------------------------------------------------------------------  Thank you for choosing [facility] at [facility] to provide your oncology and hematology care.  To afford each patient quality time with our provider, please arrive at least 15 minutes before your scheduled appointment time.   If you have a lab appointment with the Cancer Center please come in thru the Main Entrance and check in at the main information desk.  You need to re-schedule your appointment should you arrive 10 or more minutes late.  We strive to give you quality time with our providers, and arriving late affects you and other patients whose appointments are after yours.  Also, if you no show three or more times for appointments you may be dismissed from the clinic at the providers discretion.     Again, thank you for choosing [facility].  Our hope is that these requests will decrease the amount of time that you wait before being seen by our physicians.       _____________________________________________________________  Should you have questions after your  visit to [facility], please contact our office at [phone] and follow the prompts.  Our office hours are 8:00 a.m. and 4:30 p.m. Monday - Friday.  Please note that voicemails left after 4:00 p.m. may not be returned until the following business day.  We are closed weekends and major holidays.  You do have access to a nurse 24-7, just call the main number to the clinic [phone] and do not press any options, hold on the line and a nurse will answer the phone.    For prescription refill requests, have your pharmacy contact our office and allow 72 hours.    Due to Covid, you will need to wear a mask upon entering the hospital. If you do not have a mask, a mask will be given to you at the Main Entrance upon arrival. For doctor visits, patients may have 1 support person age 59 or older with them. For treatment visits, patients can not have anyone with them due to social distancing guidelines and our immunocompromised population.

## 2020-09-30 NOTE — Progress Notes (Signed)
Logan Bush [address] [place], [place] [address]   CLINIC:  Medical Oncology/Hematology  PCP:  [facility] [address] [place] [id number] [phone]   REASON FOR VISIT:  Follow-up for history of right lower extremity superficial venous thrombosis, heterozygosity for prothrombin gene mutation   PRIOR THERAPY: Eliquis x 6 months   CURRENT THERAPY: Surveillance  INTERVAL HISTORY:  [name] 60 y.o. male returns for routine follow-up of his history of lower extremity superficial venous thrombosis.  He was last seen by Logan Brennerebekah Kaede Clendenen PA-C on 05/15/2020.   At today's visit, he reports feeling fairly well.  No recent hospitalizations, surgeries, or changes in baseline health status.  Overall, he is feeling better than before.   He denies leg swelling or erythema, but does have occasional throbbing and cramping pain at the site of his previous blood clot.  No cough, hemoptysis, or palpitations.  No B symptoms such as fever, chills, nights sweats, or unintentional weight loss.  He continues to have episodes of intermittent atypical chest pain and SOB well today because or has completely gone away since it was related to the coming back to see us how get another blood clot not have another blood clot have a great life by but not also [facility] on exertion, previous work-up has been negative for ACS.  He is being seen by cardiology.  He has 50% energy and 100% appetite. He endorses that he is maintaining a stable weight.   REVIEW OF SYSTEMS:  Review of Systems  Constitutional:  Positive for fatigue. Negative for appetite change, chills, diaphoresis, fever and unexpected weight change.  HENT:   Negative for lump/mass and nosebleeds.   Eyes:  Negative for eye problems.  Respiratory:  Positive for shortness of breath (with activity). Negative for cough and hemoptysis.   Cardiovascular:  Positive for chest pain (musculoskeletal). Negative for leg  swelling and palpitations.  Gastrointestinal:  Positive for diarrhea (loose bowel movements). Negative for abdominal pain, blood in stool, constipation, nausea and vomiting.  Genitourinary:  Negative for hematuria.   Musculoskeletal:  Positive for myalgias.  Skin: Negative.   Neurological:  Positive for dizziness. Negative for headaches and light-headedness.  Hematological:  Does not bruise/bleed easily.     PAST MEDICAL/SURGICAL HISTORY:  Past Medical History:  Diagnosis Date   Allergy    DVT (deep venous thrombosis) (HCC)    Palpitation    Vertigo    Past Surgical History:  Procedure Laterality Date   COLONOSCOPY N/A 08/05/2017   Procedure: COLONOSCOPY;  Surgeon: Corbin Adeourk, Robert M, MD;  [facility];  Service: Endoscopy;  Laterality: N/A;  1:45pm-rescheduled to 7/12 @9 :30am per Darlina RumpfMartina   POLYPECTOMY  10/2012   POLYPECTOMY  08/05/2017   Procedure: POLYPECTOMY;  Surgeon: Corbin Adeourk, Robert M, MD;  [facility];  Service: Endoscopy;;     SOCIAL HISTORY:  Social History   Socioeconomic History   Marital status: Married    Spouse name: Not on file   Number of children: 3   Years of education: Not on file   Highest education level: Not on file  Occupational History   Not on file  Tobacco Use   Smoking status: Never    Passive exposure: Yes   Smokeless tobacco: Never  Vaping Use   Vaping Use: Never used  Substance and Sexual Activity   Alcohol use: No   Drug use: No   Sexual activity: Not on file  Other Topics Concern  Not on file  Social History Narrative   ** Merged History Encounter **       Right handed   Social Determinants of Health   Financial Resource Strain: Not on file  Food Insecurity: Not on file  Transportation Needs: Not on file  Physical Activity: Not on file  Stress: Not on file  Social Connections: Not on file  Intimate Partner Violence: Not on file    FAMILY HISTORY:  Family History  Problem Relation Age of Onset   Heart  attack Mother    Hypertension Mother    Cancer Father        prostate cancer   Asthma Father    Cancer Sister        breast cancer   Cancer Maternal Grandmother        Breast Cancer   Colon cancer Neg Hx    Colon polyps Neg Hx     CURRENT MEDICATIONS:  Outpatient Encounter Medications as of 09/30/2020  Medication Sig   hyoscyamine (LEVBID) 0.375 MG 12 hr tablet Take 1 tablet (0.375 mg total) by mouth in the morning.   [DISCONTINUED] RIVAROXABAN (XARELTO) VTE STARTER PACK (15 & 20 MG TABLETS) Follow package directions: Take one 15mg  tablet by mouth twice a day. On day 22, switch to one 20mg  tablet once a day. Take with food.   No facility-administered encounter medications on file as of 09/30/2020.    ALLERGIES:  Allergies  Allergen Reactions   Aspirin Other (See Comments)    Makes GI bleeding     PHYSICAL EXAM:  ECOG PERFORMANCE STATUS: 1 - Symptomatic but completely ambulatory  There were no vitals filed for this visit. There were no vitals filed for this visit. Physical Exam Constitutional:      Appearance: Normal appearance.  HENT:     Head: Normocephalic and atraumatic.     Mouth/Throat:     Mouth: Mucous membranes are moist.  Eyes:     Extraocular Movements: Extraocular movements intact.     Pupils: Pupils are equal, round, and reactive to light.  Cardiovascular:     Rate and Rhythm: Normal rate and regular rhythm.     Pulses: Normal pulses.     Heart sounds: Normal heart sounds.  Pulmonary:     Effort: Pulmonary effort is normal.     Breath sounds: Normal breath sounds.  Abdominal:     General: Bowel sounds are normal.     Palpations: Abdomen is soft.     Tenderness: There is no abdominal tenderness.  Musculoskeletal:        General: No swelling.     Right lower leg: No edema.     Left lower leg: No edema.  Lymphadenopathy:     Cervical: No cervical adenopathy.  Skin:    General: Skin is warm and dry.     Comments: Varicose vein of right inner thigh   Neurological:     General: No focal deficit present.     Mental Status: He is alert and oriented to person, place, and time.  Psychiatric:        Mood and Affect: Mood normal.        Behavior: Behavior normal.     LABORATORY DATA:  I have reviewed the labs as listed.  CBC    Component Value Date/Time   WBC 3.8 (L) 03/13/2020 0734   RBC 4.77 03/13/2020 0734   HGB 14.5 03/13/2020 0734   HCT 43.7 03/13/2020 0734   PLT 198 03/13/2020  0734   MCV 91.6 03/13/2020 0734   MCH 30.4 03/13/2020 0734   MCHC 33.2 03/13/2020 0734   RDW 12.3 03/13/2020 0734   LYMPHSABS 1.2 03/13/2020 0734   MONOABS 0.4 03/13/2020 0734   EOSABS 0.2 03/13/2020 0734   BASOSABS 0.0 03/13/2020 0734   CMP Latest Ref Rng & Units 03/13/2020 07/15/2018 02/04/2018  Glucose 70 - 99 mg/dL 86 84 846(N)  BUN 6 - 20 mg/dL 62(X) 18 17  Creatinine 0.61 - 1.24 mg/dL 5.28 4.13 2.44  Sodium 135 - 145 mmol/L 139 139 139  Potassium 3.5 - 5.1 mmol/L 3.6 3.6 3.6  Chloride 98 - 111 mmol/L 106 104 105  CO2 22 - 32 mmol/L 27 28 27   Calcium 8.9 - 10.3 mg/dL 9.4 8.9 9.2  Total Protein 6.5 - 8.1 g/dL 6.7 - 7.8  Total Bilirubin 0.3 - 1.2 mg/dL ) - 2.0(H)  Alkaline Phos 38 - 126 U/L 48 - 61  AST 15 - 41 U/L 23 - 21  ALT 0 - 44 U/L 28 - 31    DIAGNOSTIC IMAGING:  I have independently reviewed the relevant imaging and discussed with the patient.  ASSESSMENT & PLAN: 1.  Superficial venous thrombus of right great saphenous vein, provoked - Initial presentation in June 2021 after experiencing RLE pain and redness following fall from ladder - Found to have right lower extremity thrombophlebitis with clot in right saphenous vein in June 2021 - Serial ultrasound on 08/22/2019 showed progression of clot in the great saphenous vein extending to the saphenofemoral junction 1.2 cm from deep venous system - Placed on anticoagulation by vascular surgeon, completed 54-month course of Eliquis on 03/24/2020 (tolerated well despite history of  hemorrhoidal bleeding) - Repeat venous ultrasound performed on 03/24/2020 showed thrombus involving the great saphenous vein in the proximal thigh and throughout the calf. - Repeat venous 03/26/2020 on 04/28/2020 was negative for DVT, and noted that "visualized portions of the great saphenous vein were unremarkable" - D-Dimer checked on 04/28/2020 was mildly elevated ay 0.61 - Prothrombin gene mutation test is  positive for C.*97G>A heterozygosity, which does place him at a slightly higher risk (2-4 x higher) of venous thromboembolism than the general population. - CTA chest checked on 05/08/2020 due to pleuritic chest pain was negative for PE - Most recent D-dimer (09/05/2020): Normal at 0.46 - Most recent right lower extremity venous duplex (09/05/2020): No evidence of right lower extremity deep venous thrombosis, no evidence of superficial thrombophlebitis or abnormal fluid collection, specifically superficial great saphenous vein "no evidence of thrombus, normal compressibility" - He continues to have occasional throbbing and cramping at the site of his previous superficial venous thromboembolism, but reports that the symptoms are getting better - PLAN: Although patient is anxious about his history of superficial venous thrombosis, there is no compelling indication for lifelong anticoagulation.  Patient continues to follow with vascular surgery, but at this time we will discharge him from hematology clinic.  He should follow-up with 11/05/2020 in the future if he has any recurrent DVT or PE.  He is aware of alarm symptoms that would prompt immediate medical attention.  2.  Family/social history -He has no personal or family history of blood clots or known coagulopathies. -However, his mother had 6 miscarriages (as well as 4 living children). -No personal history of cancer. His father had prostate cancer. His sister and meternal grandmother had breast cancer. -He works as a [place].   PLAN SUMMARY & DISPOSITION: -  Can tentatively discharge from hematology  clinic, but patient should follow-up with [place] as needed if he develops any further blood clots in the future.  All questions were answered. The patient knows to call the clinic with any problems, questions or concerns.  Medical decision making: Low  Time spent on visit: I spent 15 minutes counseling the patient face to face. The total time spent in the appointment was 25 minutes and more than 50% was on counseling.   Carnella Guadalajara, PA-C  09/30/2020 9:15 AM

## 2020-10-08 ENCOUNTER — Ambulatory Visit: Payer: Self-pay | Admitting: Cardiology

## 2020-10-15 ENCOUNTER — Ambulatory Visit: Payer: Self-pay | Admitting: Cardiology

## 2020-10-15 NOTE — Progress Notes (Deleted)
Primary Physician/Referring:  [facility]  Patient ID: Logan Bush, male    DOB: [birth year], 60 y.o.   MRN: [id number]  No chief complaint on file.  HPI:    Logan Bush  is a 60 y.o. gentleman with no significant prior cardiovascular history, had an accidental fall, was seen in the emergency room on 08/01/2019 and was diagnosed with acute superficial thrombosis of the right lower extremity, now diagnosed with prothrombin gene mutation, heterozygous with mild increased risk of recurrent DVT, anxiety, made an urgent follow-up visit for chest pain that started 3 days ago.  Chest pain described as heaviness to sharp pain in the left side of his chest, states that it lasted all day 3 days ago but yesterday and today it gradually has improved currently described as 9 out of 10 in intensity 3 days ago and now tends 1-2 out of 10 in intensity.  No other associated symptoms.  He has rare occasional palpitations.  He has no dyspnea, chest pain worse on taking deep breath.  Past Medical History:  Diagnosis Date   Allergy    DVT (deep venous thrombosis) (HCC)    Palpitation    Vertigo    Past Surgical History:  Procedure Laterality Date   COLONOSCOPY N/A 08/05/2017   Procedure: COLONOSCOPY;  Surgeon: Corbin Ade, MD;  [facility];  Service: Endoscopy;  Laterality: N/A;  1:45pm-rescheduled to 7/12 @9 :30am per   POLYPECTOMY  10/2012   POLYPECTOMY  08/05/2017   Procedure: POLYPECTOMY;  Surgeon: 10/06/2017, MD;  [facility]: [facility];  Service: Endoscopy;;   Family History  Problem Relation Age of Onset   Heart attack Mother    Hypertension Mother    Cancer Father        prostate cancer   Asthma Father    Cancer Sister        breast cancer   Cancer Maternal Grandmother        Breast Cancer   Colon cancer Neg Hx    Colon polyps Neg Hx     Social History   Tobacco Use   Smoking status: Never    Passive exposure: Yes   Smokeless tobacco:  Never  Substance Use Topics   Alcohol use: No   Marital Status: Married  ROS  Review of Systems  Cardiovascular:  Positive for chest pain and palpitations. Negative for dyspnea on exertion and leg swelling.  Gastrointestinal:  Negative for melena.  Neurological:  Positive for paresthesias.  Psychiatric/Behavioral:  The patient is nervous/anxious.   Objective  There were no vitals taken for this visit.  Vitals with BMI 09/30/2020 08/13/2020 08/05/2020  Height - 5\' 7"  5\' 7"   Weight 175 lbs 15 oz 176 lbs 3 oz 180 lbs  BMI - 27.59 28.19  Systolic 125 131 10/06/2020  Diastolic 87 95 86  Pulse 64 76 68     Physical Exam Cardiovascular:     Rate and Rhythm: Normal rate and regular rhythm.     Pulses: Intact distal pulses.     Heart sounds: Normal heart sounds. No murmur heard.   No gallop.     Comments: No leg edema, no JVD. Right medial aspect of the leg and thigh shows superficial thrombophlebitis. No calf tenderness, no other signs of inflammation. Pulmonary:     Effort: Pulmonary effort is normal.     Breath sounds: Normal breath sounds.  Abdominal:     General: Bowel sounds are normal.  Palpations: Abdomen is soft.   Laboratory examination:   Recent Labs    03/13/20 0734  NA 139  K 3.6  CL 106  CO2 27  GLUCOSE 86  BUN 21*  CREATININE 1.04  CALCIUM 9.4  GFRNONAA >60    CrCl cannot be calculated (Patient's most recent lab result is older than the maximum 21 days allowed.).  CMP Latest Ref Rng & Units 03/13/2020 07/15/2018 02/04/2018  Glucose 70 - 99 mg/dL 86 84 106(Y)  BUN 6 - 20 mg/dL 69(S) 18 17  Creatinine 0.61 - 1.24 mg/dL 8.54 6.27 0.35  Sodium 135 - 145 mmol/L 139 139 139  Potassium 3.5 - 5.1 mmol/L 3.6 3.6 3.6  Chloride 98 - 111 mmol/L 106 104 105  CO2 22 - 32 mmol/L 27 28 27   Calcium 8.9 - 10.3 mg/dL 9.4 8.9 9.2  Total Protein 6.5 - 8.1 g/dL 6.7 - 7.8  Total Bilirubin 0.3 - 1.2 mg/dL ) - 2.0(H)  Alkaline Phos 38 - 126 U/L 48 - 61  AST 15 - 41 U/L 23 - 21   ALT 0 - 44 U/L 28 - 31   CBC Latest Ref Rng & Units 03/13/2020 07/15/2018 02/04/2018  WBC 4.0 - 10.5 K/uL 3.8(L) 7.9 8.6  Hemoglobin 13.0 - 17.0 g/dL 04/05/2018 38.1 82.9  Hematocrit 39.0 - 52.0 % 43.7 42.3 45.3  Platelets 150 - 400 K/uL 198 204 167    Lipid Panel Recent Labs    01/10/20 0832 09/22/20 0856  CHOL 203* 187  TRIG 89 60  LDLCALC 125* 116*  HDL 62 60     HEMOGLOBIN A1C Lab Results  Component Value Date   HGBA1C 5.2 10/14/2015   MPG 103 10/14/2015   TSH No results for input(s): TSH in the last 8760 hours.  Medications and allergies   Allergies  Allergen Reactions   Aspirin Other (See Comments)    Makes GI bleeding     Current Outpatient Medications  Medication Instructions   hyoscyamine (LEVBID) 0.375 mg, Oral, Every morning   Radiology:   No results found.  Cardiac Studies:   Lower Extremity Venous Duplex  08/22/2019 right leg: RIGHT:  - There is no evidence of deep vein thrombosis in the lower extremity.    - Superficial thrombosis extending from the calf up to the saphenofemoral  junction. Thrombus is 1.2 cm from the deep venous system. Thrombus has  propagated since last exam 08/08/2019.  Lower Extremity Venous Duplex  10/08/2019:  RIGHT: - Findings consistent with age indeterminate superficial vein thrombosis involving the right great saphenous vein. Thrombus is 1.78 cm from the deep venous system. - Findings appear essentially unchanged or slightly improved compared to previous examination. - There is no evidence of deep vein thrombosis in the lower extremity.  Echocardiogram 09/27/2019: Normal LV systolic function with visual EF 55-60%. Left ventricle cavity is normal in size. Normal global wall motion. Indeterminate diastolic filling pattern, normal LAP. Calculated EF 58%. Left atrial cavity is moderately dilated. Mild (Grade I) mitral regurgitation.  Mild tricuspid regurgitation. No evidence of pulmonary hypertension. Mild pulmonic  regurgitation. No prior study for comparison.  EKG  EKG 03/17/2020: Normal sinus rhythm at rate of 60 bpm, leftward axis, no evidence of ischemia.  Probably normal EKG.   No significant change from 08/28/2019   Assessment   No diagnosis found.   There are no discontinued medications.   Recommendations:   Logan Bush  is a 60 y.o. gentleman with no significant prior cardiovascular history, had  an accidental fall, was seen in the emergency room on 08/01/2019 and was diagnosed with acute superficial thrombosis of the right lower extremity, now diagnosed with prothrombin gene mutation, heterozygous with mild increased risk of recurrent DVT, anxiety, made an urgent follow-up visit for chest pain that started 3 days ago.  His chest pain symptoms clearly appears very atypical at most and most probably musculoskeletal.  Given his normal exam otherwise and normal EKG, we will schedule him for a routine treadmill exercise stress test.  He also has mixed hyperlipidemia, he eats a pint of ice cream every 2 days and also drinks 2 L of soda almost on a daily basis.  Dietary modification discussed.  I also discussed with him regarding increasing his physical activity, he is accompanied by his wife and also his daughter had called earlier stating that he essentially just sits at home.  Primary prevention and long-term effects of lack of physical activity, elevated lipids discussed in detail.  He now appears motivated for making lifestyle changes.  I would like to see him back in 2 months for follow-up, we will repeat his lipids prior to his next office visit.    Yates Decamp, MD, [facility] 10/15/2020, 12:43 PM Office: [phone]

## 2021-03-23 ENCOUNTER — Other Ambulatory Visit: Payer: Self-pay | Admitting: Internal Medicine

## 2021-03-23 NOTE — Telephone Encounter (Signed)
Last office visit was 04/29/2020 with Dr. Gala Romney

## 2021-10-02 ENCOUNTER — Other Ambulatory Visit: Payer: Self-pay | Admitting: Gastroenterology

## 2022-05-06 ENCOUNTER — Ambulatory Visit
Admission: EM | Admit: 2022-05-06 | Discharge: 2022-05-06 | Disposition: A | Discharge status: Home / Self Care | Payer: Self-pay | Attending: Family Medicine | Admitting: Family Medicine

## 2022-05-06 ENCOUNTER — Encounter: Payer: Self-pay | Admitting: Emergency Medicine

## 2022-05-06 DIAGNOSIS — M62830 Muscle spasm of back: Secondary | ICD-10-CM

## 2022-05-06 LAB — POCT URINALYSIS DIP (MANUAL ENTRY)
Bilirubin, UA: NEGATIVE
Glucose, UA: NEGATIVE mg/dL
Ketones, POC UA: NEGATIVE mg/dL
Leukocytes, UA: NEGATIVE
Nitrite, UA: NEGATIVE
Protein Ur, POC: NEGATIVE mg/dL
Spec Grav, UA: 1.015 (ref 1.010–1.025)
Urobilinogen, UA: 0.2 E.U./dL
pH, UA: 5.5 (ref 5.0–8.0)

## 2022-05-06 MED ORDER — CELECOXIB 100 MG PO CAPS: 100.0000 mg | ORAL_CAPSULE | Freq: Two times a day (BID) | ORAL | 0 refills | Status: DC

## 2022-05-06 MED ORDER — METHOCARBAMOL 500 MG PO TABS: 500.0000 mg | ORAL_TABLET | Freq: Two times a day (BID) | ORAL | 0 refills | Status: DC | PRN

## 2022-05-06 MED ORDER — PREDNISONE 20 MG PO TABS: 40.0000 mg | ORAL_TABLET | Freq: Every day | ORAL | 0 refills | Status: DC

## 2022-05-06 NOTE — Discharge Instructions (Addendum)
HOME CARE INSTRUCTIONS: For many people, back pain returns. Since low back pain is rarely dangerous, it is often a condition that people can learn to manage on their own. Please remain active. It is stressful on the back to sit or stand in one place. Do not sit, drive, or stand in one place for more than 30 minutes at a time.

## 2022-05-06 NOTE — ED Provider Notes (Signed)
[facility]   [id number] 05/06/22 Arrival Time: 9675  ASSESSMENT & PLAN:  1. Muscle spasm of back    Able to ambulate here and hemodynamically stable. Suspect MSK etiology of back pain. No indication for imaging of back at this time given no trauma and normal neurological exam. Discussed.  Meds ordered this encounter  Medications   methocarbamol (ROBAXIN) 500 MG tablet    Sig: Take 1 tablet (500 mg total) by mouth 2 (two) times daily as needed for muscle spasms.    Dispense:  20 tablet    Refill:  0   predniSONE (DELTASONE) 20 MG tablet    Sig: Take 2 tablets (40 mg total) by mouth daily.    Dispense:  10 tablet    Refill:  0   celecoxib (CELEBREX) 100 MG capsule    Sig: Take 1 capsule (100 mg total) by mouth 2 (two) times daily.    Dispense:  10 capsule    Refill:  0   Medication sedation precautions given. Encourage ROM/movement as tolerated.  Recommend:  Follow-up Information     [facility].   Why: If your back pain is worsening or failing to improve as anticipated. Contact information: [address] [place] [address] [phone]                Reviewed expectations re: course of current medical issues. Questions answered. Outlined signs and symptoms indicating need for more acute intervention. Patient verbalized understanding. After Visit Summary given.   SUBJECTIVE: History from: patient. Spanish video interpreter used.  Logan Bush is a 62 y.o. male who presents with complaint of persistent right sided "tight" back pain. Onset gradual. First noted  approx one week ago after digging holes for chicken coup posts; mainly with pain in back but does feel some groin pain that is separate from back pain. Denies abd pain . Denies trauma. History of back problems requiring medical care: occasional. Denies extremity sensation changes or weakness.  Ambulatory without difficulty. Normal PO intake  without n/v. No associated abdominal pain/n/v. Took 1/2 of wife's Mobic 7.5 mg yesterday without any relief.  OBJECTIVE:  Vitals:   05/06/22 0920  BP: 136/86  Pulse: 61  Resp: 18  Temp: 98.2 F (36.8 C)  TempSrc: Oral  SpO2: 98%    General appearance: alert; no distress HEENT: [place]; AT Neck: supple with FROM; without midline tenderness CV: regular Lungs: unlabored respirations; speaks full sentences without difficulty Abdomen: soft, non-tender; non-distended Back: significant and poorly localized tenderness to palpation over R lumbar and thoracic paraspinal musculature ; FROM at waist; bruising: none; without midline tenderness Extremities: without edema; symmetrical without gross deformities; normal ROM of bilateral LE Skin: warm and dry Neurologic: normal gait; normal sensation and strength of bilateral LE Psychological: alert and cooperative; normal mood and affect  Labs: Results for orders placed or performed during the hospital encounter of 05/06/22  POCT urinalysis dipstick  Result Value Ref Range   Color, UA yellow yellow   Clarity, UA clear clear   Glucose, UA negative negative mg/dL   Bilirubin, UA negative negative   Ketones, POC UA negative negative mg/dL   Spec Grav, UA 5.701 7.793 - 1.025   Blood, UA small (A) negative   pH, UA 5.5 5.0 - 8.0   Protein Ur, POC negative negative mg/dL   Urobilinogen, UA 0.2 0.2 or 1.0 E.U./dL   Nitrite, UA Negative Negative   Leukocytes, UA Negative Negative  Labs Reviewed  POCT URINALYSIS DIP (MANUAL ENTRY) - Abnormal; Notable for the following components:      Result Value   Blood, UA small (*)    All other components within normal limits     Allergies  Allergen Reactions   Aspirin Other (See Comments)    Makes GI bleeding    Past Medical History:  Diagnosis Date   Allergy    DVT (deep venous thrombosis)    Palpitation    Vertigo    Social History   Socioeconomic History   Marital status: Married     Spouse name: Not on file   Number of children: 3   Years of education: Not on file   Highest education level: Not on file  Occupational History   Not on file  Tobacco Use   Smoking status: Never    Passive exposure: Yes   Smokeless tobacco: Never  Vaping Use   Vaping Use: Never used  Substance and Sexual Activity   Alcohol use: No   Drug use: No   Sexual activity: Not on file  Other Topics Concern   Not on file  Social History Narrative   ** Merged History Encounter **       Right handed   Social Determinants of Health   Financial Resource Strain: Not on file  Food Insecurity: Not on file  Transportation Needs: Not on file  Physical Activity: Not on file  Stress: Not on file  Social Connections: Not on file  Intimate Partner Violence: Not on file   Family History  Problem Relation Age of Onset   Heart attack Mother    Hypertension Mother    Cancer Father        prostate cancer   Asthma Father    Cancer Sister        breast cancer   Cancer Maternal Grandmother        Breast Cancer   Colon cancer Neg Hx    Colon polyps Neg Hx    Past Surgical History:  Procedure Laterality Date   COLONOSCOPY N/A 08/05/2017   Procedure: COLONOSCOPY;  Surgeon: Corbin Ade, MD;  Location: [facility];  Service: Endoscopy;  Laterality: N/A;  1:45pm-rescheduled to 7/12 @9 :30am per Darlina Rumpf   POLYPECTOMY  10/2012   POLYPECTOMY  08/05/2017   Procedure: POLYPECTOMY;  Surgeon: Corbin Ade, MD;  Location: [facility];  Service: Endoscopy;;      Mardella Layman, MD 05/06/22 1228

## 2022-05-06 NOTE — ED Triage Notes (Signed)
Right sided flank pain that radiates to right groin x 6 days.  Pain is moving up back today. Has been taking wife's meloxicam with some relief.  States tylenol has not helped pain.   States he has been digging holes and lifting heavy stuff recently while building a chicken coop.

## 2022-05-21 ENCOUNTER — Ambulatory Visit (HOSPITAL_COMMUNITY)
Admission: RE | Admit: 2022-05-21 | Discharge: 2022-05-21 | Disposition: A | Discharge status: Home / Self Care | Payer: Self-pay | Source: Ambulatory Visit | Attending: Physician Assistant | Admitting: Physician Assistant

## 2022-05-21 ENCOUNTER — Other Ambulatory Visit (HOSPITAL_COMMUNITY): Payer: Self-pay | Admitting: Physician Assistant

## 2022-05-21 DIAGNOSIS — M545 Low back pain, unspecified: Secondary | ICD-10-CM

## 2022-08-25 ENCOUNTER — Ambulatory Visit
Admission: EM | Admit: 2022-08-25 | Discharge: 2022-08-25 | Disposition: A | Discharge status: Home / Self Care | Payer: Self-pay | Attending: Family Medicine | Admitting: Family Medicine

## 2022-08-25 ENCOUNTER — Ambulatory Visit (INDEPENDENT_AMBULATORY_CARE_PROVIDER_SITE_OTHER): Payer: Self-pay

## 2022-08-25 DIAGNOSIS — M25572 Pain in left ankle and joints of left foot: Secondary | ICD-10-CM

## 2022-08-25 DIAGNOSIS — M79605 Pain in left leg: Secondary | ICD-10-CM

## 2022-08-25 MED ORDER — METHOCARBAMOL 500 MG PO TABS: 500.0000 mg | ORAL_TABLET | Freq: Three times a day (TID) | ORAL | 0 refills | Status: DC | PRN

## 2022-08-25 MED ORDER — MELOXICAM 15 MG PO TABS: 15.0000 mg | ORAL_TABLET | Freq: Every day | ORAL | 0 refills | Status: DC
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
## 2023-01-20 ENCOUNTER — Encounter: Payer: Self-pay | Admitting: Internal Medicine

## 2023-01-20 ENCOUNTER — Ambulatory Visit: Payer: Self-pay | Admitting: Internal Medicine

## 2023-01-20 DIAGNOSIS — M545 Low back pain, unspecified: Secondary | ICD-10-CM

## 2023-01-20 DIAGNOSIS — J069 Acute upper respiratory infection, unspecified: Secondary | ICD-10-CM

## 2023-01-20 DIAGNOSIS — Z5971 Insufficient health insurance coverage: Secondary | ICD-10-CM

## 2023-01-20 DIAGNOSIS — Z603 Acculturation difficulty: Secondary | ICD-10-CM

## 2023-01-20 DIAGNOSIS — R519 Headache, unspecified: Secondary | ICD-10-CM

## 2023-01-20 DIAGNOSIS — M79641 Pain in right hand: Secondary | ICD-10-CM

## 2023-01-20 DIAGNOSIS — G8929 Other chronic pain: Secondary | ICD-10-CM

## 2023-01-20 DIAGNOSIS — Z758 Other problems related to medical facilities and other health care: Secondary | ICD-10-CM

## 2023-01-20 DIAGNOSIS — G4486 Cervicogenic headache: Secondary | ICD-10-CM

## 2023-01-20 DIAGNOSIS — R413 Other amnesia: Secondary | ICD-10-CM

## 2023-01-20 MED ORDER — CYCLOBENZAPRINE HCL 10 MG PO TABS: 10.0000 mg | ORAL_TABLET | Freq: Two times a day (BID) | ORAL | 5 refills | Status: DC | PRN

## 2023-01-20 MED ORDER — GABAPENTIN 300 MG PO CAPS: 300.0000 mg | ORAL_CAPSULE | Freq: Three times a day (TID) | ORAL | 3 refills | Status: DC | PRN

## 2023-01-20 MED ORDER — IBUPROFEN 800 MG PO TABS: 800.0000 mg | ORAL_TABLET | Freq: Three times a day (TID) | ORAL | 5 refills | Status: DC | PRN

## 2023-01-20 MED ORDER — DULOXETINE HCL 30 MG PO CPEP: 30.0000 mg | ORAL_CAPSULE | Freq: Every day | ORAL | 3 refills | Status: DC
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
## 2023-04-04 ENCOUNTER — Telehealth: Payer: Self-pay

## 2023-04-04 NOTE — Telephone Encounter (Signed)
 Conducted a wellness follow up with the patient by phone on today with interpretation assistance with Maricarmen status post their earlier appointment for their renewal enrollment at the [facility]      Pt states he is currently doing well and denies having any medical issues of concern to date.  States he has no needs related to housing, food, transportation or behavioral health  Plan -Pt was reminded of importance of maintaining appointments and yearly examinations  -Pt was advised to make an appointment  by contacting the health department to schedule the appointment  -Pt agreed to allow me to assist him on today (3.10.25) by phone with making an appointment  Pt was grateful and will expect a call back from me by 1:30pm on today

## 2023-04-04 NOTE — Telephone Encounter (Signed)
 Conducted a follow up call with patient to provide assistance with helping him to get scheduled for his annual examination to assist him with staying on schedule with receiving consistent medical care.  Plan -Assisted pt by phone to with getting his appointment scheduled with the [facility].     -Appointment has been scheduled for Wednesday, March 12 at 2:00pm  -Pt was advised if any other needs arise as it relates to medical care questions or socio-determinant needs, to please give me or the  [facility] team a call  Pt was grateful for services received and call ended

## 2023-08-21 ENCOUNTER — Emergency Department (HOSPITAL_COMMUNITY): Payer: Self-pay

## 2023-08-21 ENCOUNTER — Emergency Department (HOSPITAL_COMMUNITY)
Admission: EM | Admit: 2023-08-21 | Discharge: 2023-08-21 | Disposition: A | Discharge status: Home / Self Care | Payer: Self-pay | Attending: Emergency Medicine | Admitting: Emergency Medicine

## 2023-08-21 ENCOUNTER — Other Ambulatory Visit: Payer: Self-pay

## 2023-08-21 ENCOUNTER — Encounter (HOSPITAL_COMMUNITY): Payer: Self-pay | Admitting: Emergency Medicine

## 2023-08-21 DIAGNOSIS — W010XXA Fall on same level from slipping, tripping and stumbling without subsequent striking against object, initial encounter: Secondary | ICD-10-CM | POA: Insufficient documentation

## 2023-08-21 DIAGNOSIS — R079 Chest pain, unspecified: Secondary | ICD-10-CM | POA: Insufficient documentation

## 2023-08-21 DIAGNOSIS — R519 Headache, unspecified: Secondary | ICD-10-CM | POA: Insufficient documentation

## 2023-08-21 LAB — CBC
HCT: 43.7 % (ref 39.0–52.0)
Hemoglobin: 15.5 g/dL (ref 13.0–17.0)
MCH: 31.9 pg (ref 26.0–34.0)
MCHC: 35.5 g/dL (ref 30.0–36.0)
MCV: 89.9 fL (ref 80.0–100.0)
Platelets: 224 K/uL (ref 150–400)
RBC: 4.86 MIL/uL (ref 4.22–5.81)
RDW: 12.3 % (ref 11.5–15.5)
WBC: 5.2 K/uL (ref 4.0–10.5)
nRBC: 0 % (ref 0.0–0.2)

## 2023-08-21 LAB — BASIC METABOLIC PANEL WITH GFR
Anion gap: 8 (ref 5–15)
BUN: 21 mg/dL (ref 8–23)
CO2: 25 mmol/L (ref 22–32)
Calcium: 8.9 mg/dL (ref 8.9–10.3)
Chloride: 107 mmol/L (ref 98–111)
Creatinine, Ser: 1.04 mg/dL (ref 0.61–1.24)
GFR, Estimated: >60 mL/min (ref >60)
Glucose, Bld: 105 mg/dL — ABNORMAL HIGH (ref 70–99)
Potassium: 3.9 mmol/L (ref 3.5–5.1)
Sodium: 140 mmol/L (ref 135–145)

## 2023-08-21 LAB — TROPONIN I (HIGH SENSITIVITY)
Troponin I (High Sensitivity): 3 ng/L (ref <18)
Troponin I (High Sensitivity): 3 ng/L (ref <18)

## 2023-08-21 MED ORDER — IOHEXOL 350 MG/ML SOLN
75.0000 mL | Freq: Once | INTRAVENOUS | Status: AC | PRN
  Administered 2023-08-21: 75 mL via INTRAVENOUS

## 2023-08-21 NOTE — ED Triage Notes (Addendum)
 Pt reports chest pain since this am, diarrhea x 2 days, left sided headache x 1 week, pt reports falling 8 feet from ladder 2-3 weeks ago with left sided pain since

## 2023-08-21 NOTE — Discharge Instructions (Addendum)
 You were seen in the emergency room for new headaches and chest discomfort.  The overall workup in the emergency room is reassuring.  Your cardiac enzymes are normal, x-ray of the chest, EKG are normal.  We also did CT angiogram of your head and neck, and it is also entirely normal.  We are not certain what is causing your symptoms.  For your chest pain, we recommend that you follow-up with your primary care doctor.  For your new headaches, we have put in a referral for neurologist.  Please return to the ER if the headache gets severe and in not improving, you have associated new one sided numbness, tingling, weakness or confusion, seizures, poor balance or poor vision.  Please return to the ER if you have worsening chest pain, shortness of breath, pain radiating to your jaw, shoulder, or back, sweats or fainting. Otherwise see the Cardiologist or your primary care doctor as requested.

## 2023-08-21 NOTE — ED Provider Notes (Signed)
 [place] [facility] AT [facility] Provider Note   CSN: [id number] Arrival date & time: 08/21/23  9381     Patient presents with: Chest Pain   Logan Bush is a 63 y.o. male.   HPI     63 year old male with no concerning past medical history comes in with chief complaint of headache, chest pain.  Patient states that he was having chest pain off and on for the last month.  Chest pain is intermittent, unprovoked and will last for several minutes.  Pain is left-sided, occasionally radiating to the back.  Patient denies any exertional component to the pain.  When he has pain, he will notice that the pain is worse with breathing.  Patient has sometimes noted shortness of breath, but it only is appreciated when he is exerting himself while having chest pain.  Patient denies any smoking.  No previous cardiac disease history.  No family history of premature CAD.  Patient reports that he has had headaches now for the last 2 weeks.  About 3 weeks ago, he had a fall.  He fell from a deck, that was 8 feet tall.  His feet slipped, and he fell backwards.  He made sure that he did not strike his head in the process.  Ever since then he has been having left-sided discomfort as well.  Patient also has started noticing intermittent episodes of headache.  Headache is frontal, left-sided and radiates down to the neck.  The headache will last only for about 30 to 60 seconds and then resolve.  There is no specific evoking, aggravating or relieving factors. Pt has no associated nausea, vomiting, seizures, loss of consciousness or new visual complains, weakness, numbness, dizziness or gait instability.  Patient states that the intensity of the headache has worsened.  He gets about 5 episodes a day, which is stable.  He had told himself that if the headaches do not improve by today then he will come to the ER which is why he is here.   Prior to Admission medications   Medication Sig Start Date  End Date Taking? Authorizing Provider  celecoxib  (CELEBREX ) 100 MG capsule Take 1 capsule (100 mg total) by mouth 2 (two) times daily. Patient not taking: Reported on 01/20/2023 05/06/22   Rolinda Rogue, MD  cyclobenzaprine  (FLEXERIL ) 10 MG tablet Take 1 tablet (10 mg total) by mouth 2 (two) times daily as needed for muscle spasms. 01/20/23   Jesus Bernardino MATSU, MD  DULoxetine  (CYMBALTA ) 30 MG capsule Take 1 capsule (30 mg total) by mouth daily. Must be taken daily and wait a month for effect, don't sudden stop 01/20/23   Jesus Bernardino MATSU, MD  gabapentin  (NEURONTIN ) 300 MG capsule Take 1 capsule (300 mg total) by mouth 3 (three) times daily as needed. For cervicogenic headache. 01/20/23   Jesus Bernardino MATSU, MD  hyoscyamine  (LEVBID ) 0.375 MG 12 hr tablet TAKE 1 TABLET BY MOUTH IN THE MORNING Patient not taking: Reported on 01/20/2023 10/08/21   Shirlean Therisa ORN, NP  ibuprofen  (ADVIL ) 800 MG tablet Take 1 tablet (800 mg total) by mouth every 8 (eight) hours as needed. 01/20/23   Jesus Bernardino MATSU, MD  meloxicam  (MOBIC ) 15 MG tablet Take 1 tablet (15 mg total) by mouth daily. Patient not taking: Reported on 01/20/2023 08/25/22   Stuart Vernell Norris, PA-C  methocarbamol  (ROBAXIN ) 500 MG tablet Take 1 tablet (500 mg total) by mouth every 8 (eight) hours as needed for muscle spasms. Do not drink  alcohol or drive while taking this medication.  May cause drowsiness. Patient not taking: Reported on 01/20/2023 08/25/22   Stuart Vernell Norris, PA-C  predniSONE  (DELTASONE ) 20 MG tablet Take 2 tablets (40 mg total) by mouth daily. Patient not taking: Reported on 01/20/2023 05/06/22   Rolinda Rogue, MD  RIVAROXABAN  (XARELTO ) VTE STARTER PACK (15 & 20 MG TABLETS) Follow package directions: Take one 15mg  tablet by mouth twice a day. On day 22, switch to one 20mg  tablet once a day. Take with food. 08/01/19 08/01/19  Patsey Lot, MD    Allergies: Patient has no known allergies.    Review of Systems  All other systems  reviewed and are negative.   Updated Vital Signs BP (!) 136/95   Pulse 68   Temp 98 F (36.7 C) (Oral)   Resp 12   Ht 5' 7 (1.702 m)   Wt 84.8 kg   SpO2 97%   BMI 29.29 kg/m   Physical Exam Vitals and nursing note reviewed.  Constitutional:      Appearance: He is well-developed.  HENT:     Head: Atraumatic.  Cardiovascular:     Rate and Rhythm: Normal rate.     Heart sounds: Normal heart sounds.  Pulmonary:     Effort: Pulmonary effort is normal.     Breath sounds: Normal breath sounds.  Musculoskeletal:     Cervical back: Neck supple.  Skin:    General: Skin is warm.  Neurological:     General: No focal deficit present.     Mental Status: He is alert and oriented to person, place, and time.     Cranial Nerves: No cranial nerve deficit.     Motor: No weakness.     Comments: No meningismus     (all labs ordered are listed, but only abnormal results are displayed) Labs Reviewed  BASIC METABOLIC PANEL WITH GFR - Abnormal; Notable for the following components:      Result Value   Glucose, Bld 105 (*)    All other components within normal limits  CBC  TROPONIN I (HIGH SENSITIVITY)  TROPONIN I (HIGH SENSITIVITY)    EKG: EKG Interpretation Date/Time:  Sunday August 21 2023 06:32:09 EDT Ventricular Rate:  64 PR Interval:  140 QRS Duration:  99 QT Interval:  438 QTC Calculation: 452 R Axis:   -22  Text Interpretation: Sinus rhythm Borderline left axis deviation No significant change since last tracing Confirmed by Midge Golas ([id number]) on 08/21/2023 6:37:39 AM  Radiology: CT Angio Head Neck W WO CM Result Date: 08/21/2023 EXAM: CT HEAD WITHOUT CTA HEAD AND NECK WITH AND WITHOUT 08/21/2023 08:24:12 AM TECHNIQUE: CTA of the head and neck was performed with and without the administration of intravenous contrast. Noncontrast CT of the head with reconstructed 2-D images are also provided for review. Multiplanar 2D and/or 3D reformatted images are provided for  review. Automated exposure control, iterative reconstruction, and/or weight based adjustment of the mA/kV was utilized to reduce the radiation dose to as low as reasonably achievable. COMPARISON: CT head without and with contrast 05/31/2019. MR head without contrast 01/07/2020. CLINICAL HISTORY: Ataxia, head trauma; Neck trauma, dangerous injury mechanism (Age 69-65y); dizziness, L sided pain - head and neck since fall. Reports chest pain since this am, diarrhea x 2 days, left sided headache x 1 week, pt reports falling 8 feet from ladder 2-3 weeks ago with left sided pain since. FINDINGS: CT HEAD: BRAIN AND VENTRICLES: No acute intracranial hemorrhage. No mass effect or  midline shift. No extra-axial fluid collection. Gray-white differentiation is maintained. No hydrocephalus. ORBITS: No acute abnormality. SINUSES: Small polyps or mixed retention cysts are present in the left maxillary sinus and right sphenoid sinus. SOFT TISSUES AND SKULL: No acute abnormality. CTA NECK: AORTIC ARCH AND ARCH VESSELS: A common origin of the left common carotid artery and innominate artery is present, a normal variant. No dissection or arterial injury. No significant stenosis of the brachiocephalic or subclavian arteries. CERVICAL CAROTID ARTERIES: No dissection, arterial injury, or hemodynamically significant stenosis by NASCET criteria. CERVICAL VERTEBRAL ARTERIES: The right vertebral artery is the dominant vessel. No dissection, arterial injury, or significant stenosis. VISUALIZED LUNGS AND MEDIASTINUM: Unremarkable. SOFT TISSUES: No acute abnormality. BONES: Uncovertebral spurring is present bilaterally at C5-6 with moderate right and mild left foraminal stenosis. No focal osseous lesions are present. CTA HEAD: ANTERIOR CIRCULATION: No significant stenosis of the internal carotid arteries. No significant stenosis of the anterior cerebral arteries. No significant stenosis of the middle cerebral arteries. No aneurysm. POSTERIOR  CIRCULATION: A fetal type right posterior cerebral artery is present. No significant stenosis of the posterior cerebral arteries. No significant stenosis of the basilar artery. No significant stenosis of the vertebral arteries. No aneurysm. OTHER: No dural venous sinus thrombosis on this non-dedicated study. IMPRESSION: 1. No acute intracranial hemorrhage. 2. No large vessel occlusion, hemodynamically significant stenosis, or aneurysm in the head or neck. Electronically signed by: Lonni Necessary MD 08/21/2023 08:45 AM EDT RP Workstation: [id number]   DG Chest 2 View Result Date: 08/21/2023 CLINICAL DATA:  Chest pain. EXAM: CHEST - 2 VIEW COMPARISON:  03/13/2020 FINDINGS: The lungs are clear without focal pneumonia, edema, pneumothorax or pleural effusion. The cardiopericardial silhouette is within normal limits for size. No acute bony abnormality. Telemetry leads overlie the chest. IMPRESSION: No active cardiopulmonary disease. Electronically Signed   By: Camellia Candle M.D.   On: 08/21/2023 07:23     Procedures   Medications Ordered in the ED  iohexol  (OMNIPAQUE ) 350 MG/ML injection 75 mL (75 mLs Intravenous Contrast Given 08/21/23 9187)                                    Medical Decision Making Amount and/or Complexity of Data Reviewed Labs: ordered. Radiology: ordered.  Risk Prescription drug management.   63 year old male with no concerning past medical history comes in with chief complaint of headache, neck pain and chest pain.  He also had a fall, that was about 2 weeks ago.  The chest pain has been present for more than 2 weeks, the headache/neck pain has been present since the fall.  Patient has no concerning past medical history.  Collateral history provided by patient's spouse, was at the bedside.  Have also reviewed patient's records.  It appears that he saw his PCP in 2024, at that time he was told that he might have cervicogenic headaches.  X-ray of the neck were ordered,  but patient did not complete them.  It appears that cost was a barrier.  Those headaches appear to be different, as they originate on the right side and then radiates to the left side.   Differential diagnosis consideration for this patient includes: Primary headaches - including migrainous headaches, cluster headaches, tension headaches. ICH Carotid dissection/vertebral dissection from trauma Cavernous sinus thrombosis Meningitis Encephalitis Sinusitis Tumor Vascular headaches AV malformation Brain aneurysm Muscular headaches  For the chest pain, differential diagnosis includes acute coronary  syndrome, chest wall pain, GERD, PE.  Patient's hear score is 1.  We will get delta troponin.  EKG is completely normal.   New headaches, over age 35, with some neck and head pain in the past that seems different per PCP notes.  Will get CT angiogram head and neck, especially in the setting of recent trauma prior to which she was not having the symptoms. If the CT angiogram head and neck is normal -we will recommend that he gets neurology follow-up.  Troponin x 2, EKG, x-ray of the chest ordered for cardiac workup.  If that is normal, will advise that he follows up with PCP.  10:21 AM The patient appears reasonably screened and/or stabilized for discharge and I doubt any other medical condition or other [facility] requiring further screening, evaluation, or treatment in the ED at this time prior to discharge.   Results from the ER workup discussed with the patient face to face and all questions answered to the best of my ability. The patient is safe for discharge with strict return precautions.   Final diagnoses:  New onset of headaches after age 83  Nonspecific chest pain    ED Discharge Orders          Ordered    Ambulatory referral to Neurology       Comments: An appointment is requested in approximately: 2 weeks   08/21/23 1001               Charlyn Sora, MD 08/21/23 1021

## 2023-08-22 ENCOUNTER — Encounter: Payer: Self-pay | Admitting: Neurology

## 2023-08-23 ENCOUNTER — Telehealth: Payer: Self-pay

## 2023-08-23 NOTE — Telephone Encounter (Signed)
 Attempted follow up after recent ED visit with interpreter. No answer, left message requesting return call.   Avelina JONELLE Skeen RN [employer]

## 2023-08-25 ENCOUNTER — Encounter: Payer: Self-pay | Admitting: Gastroenterology

## 2023-08-30 ENCOUNTER — Telehealth: Payer: Self-pay

## 2023-08-30 NOTE — Telephone Encounter (Signed)
 2nd attempt for follow up after recent ED visit with interpreter services. No answer, unable to leave message. Will attempt another attempt later this week.   Avelina JONELLE Skeen RN [employer]

## 2023-09-06 ENCOUNTER — Encounter: Payer: Self-pay | Admitting: *Deleted

## 2023-09-06 ENCOUNTER — Ambulatory Visit (INDEPENDENT_AMBULATORY_CARE_PROVIDER_SITE_OTHER): Payer: Self-pay | Admitting: Gastroenterology

## 2023-09-06 ENCOUNTER — Encounter: Payer: Self-pay | Admitting: Gastroenterology

## 2023-09-06 VITALS — BP 122/82 | HR 73 | Temp 97.9°F | Ht 67.0 in | Wt 181.4 lb

## 2023-09-06 DIAGNOSIS — R1012 Left upper quadrant pain: Secondary | ICD-10-CM

## 2023-09-06 DIAGNOSIS — Z9889 Other specified postprocedural states: Secondary | ICD-10-CM | POA: Insufficient documentation

## 2023-09-06 DIAGNOSIS — R110 Nausea: Secondary | ICD-10-CM | POA: Insufficient documentation

## 2023-09-06 DIAGNOSIS — K219 Gastro-esophageal reflux disease without esophagitis: Secondary | ICD-10-CM | POA: Insufficient documentation

## 2023-09-06 DIAGNOSIS — R197 Diarrhea, unspecified: Secondary | ICD-10-CM | POA: Insufficient documentation

## 2023-09-06 DIAGNOSIS — K589 Irritable bowel syndrome without diarrhea: Secondary | ICD-10-CM | POA: Insufficient documentation

## 2023-09-06 DIAGNOSIS — Z860100 Personal history of colon polyps, unspecified: Secondary | ICD-10-CM

## 2023-09-06 DIAGNOSIS — K580 Irritable bowel syndrome with diarrhea: Secondary | ICD-10-CM

## 2023-09-06 DIAGNOSIS — K921 Melena: Secondary | ICD-10-CM

## 2023-09-06 DIAGNOSIS — Z791 Long term (current) use of non-steroidal anti-inflammatories (NSAID): Secondary | ICD-10-CM

## 2023-09-06 DIAGNOSIS — K529 Noninfective gastroenteritis and colitis, unspecified: Secondary | ICD-10-CM

## 2023-09-06 NOTE — Progress Notes (Signed)
 GI Office Note    Referring Provider: Jesus Bernardino MATSU, MD Primary Care Physician:  Carol Catalan, PA-C  Primary Gastroenterologist: Ozell Hollingshead, MD   Chief Complaint   Chief Complaint  Patient presents with   Blood In Stools    Stools were black for a couple of weeks.      History of Present Illness   Logan Bush is a 63 y.o. male presenting today for diarrhea, black stools.  Last seen 2022.  Seen at that time with fecal urgency, occasional incontinence, paper hematochezia.  Antispasmodic therapy (hyoscyamine ) had diminished his symptoms somewhat, suspected element of IBS. Presents at request of O'Laf Massenburg PA-C due to diarrhea, melena.   Recent ED visit 3 weeks ago for headache and chest pain.  Chest pain present for 1 month, nonexertional, worse with breathing. Noted a fall from a deck, approximately 8 feet, feet slipped and he fell backwards.  Denied hitting his head.  Ever since then having left-sided discomfort in the head radiating downward into the left neck.  CTA head and neck with and without contrast, CT head without acute findings.  Chest x-ray unremarkable.  Patient agreed to use of [employer]bridge AI.   He has been experiencing black stools and diarrhea that began approximately two weeks after falling from an eight-foot deck. Initially, the diarrhea was very loose and watery, persisting for about five days, with stool colors ranging from light yellow but on two days was very dark green to dark brown/black. Dark stools happened few days apart. He attempted dietary modifications, including consuming water, tea, and white rice, and used a pink liquid medication similar to Pepto Bismol to manage the diarrhea. The frequency of diarrhea was six to eight times a day, with recent stools becoming more solid but still frequent, at least 4-5 times daily the last couple of days. He denies nocturnal symptoms. No fresh blood per rectum. Denies abdominal pain.   He has a history  of similar gastrointestinal symptoms dating back to 2014, which led to a colonoscopy. His last colonoscopy was in 2019, where a small polyp was found but did not survive processing. He was previously on medication prescribed for increased stool frequency and looseness (Levbid ) but has since stopped taking it.    He denies recent antibiotic use, with the last course being in March, and reports taking ibuprofen  for pain following his fall. He has since stopped ibuprofen  use due to concerns about potential bleeding. He has not taken Celebrex  or muscle relaxers which were on his medication list. States he preferred to stick to ibuprofen  over the years. He reports a sensation of nausea at night but denies vomiting. He experiences pain in his bottom during bowel movements, likely due to frequent wiping. He reports some symptoms of heartburn or indigestion. No dysphagia.    Prior Data   Colonoscopy July 2019: -one 5 mm polyp in the descending colon removed - Polyp did not survive pathologies processing, recommended next colonoscopy in 10 years  Medications   No current outpatient medications on file.   No current facility-administered medications for this visit.    Allergies   Allergies as of 09/06/2023   (No Known Allergies)    Past Medical History   Past Medical History:  Diagnosis Date   Allergy    DVT (deep venous thrombosis) (HCC)    Palpitation    Vertigo     Past Surgical History   Past Surgical History:  Procedure Laterality Date  COLONOSCOPY N/A 08/05/2017   Procedure: COLONOSCOPY;  Surgeon: Shaaron Lamar HERO, MD;  Location: [facility];  Service: Endoscopy;  Laterality: N/A;  1:45pm-rescheduled to 7/12 @9 :30am per [place]   POLYPECTOMY  10/2012   POLYPECTOMY  08/05/2017   Procedure: POLYPECTOMY;  Surgeon: Shaaron Lamar HERO, MD;  Location: [facility];  Service: Endoscopy;;    Past Family History   Family History  Problem Relation Age of Onset   Heart attack Mother     Hypertension Mother    Cancer Father        prostate cancer   Asthma Father    Cancer Sister        breast cancer   Cancer Maternal Grandmother        Breast Cancer   Colon cancer Neg Hx    Colon polyps Neg Hx     Past Social History   Social History   Socioeconomic History   Marital status: Married    Spouse name: Not on file   Number of children: 3   Years of education: Not on file   Highest education level: Not on file  Occupational History   Not on file  Tobacco Use   Smoking status: Never    Passive exposure: Yes   Smokeless tobacco: Never  Vaping Use   Vaping status: Never Used  Substance and Sexual Activity   Alcohol use: No   Drug use: No   Sexual activity: Not on file  Other Topics Concern   Not on file  Social History Narrative   ** Merged History Encounter **       Right handed   Social Drivers of Health   Financial Resource Strain: Not on file  Food Insecurity: Not on file  Transportation Needs: Not on file  Physical Activity: Not on file  Stress: Not on file  Social Connections: Not on file  Intimate Partner Violence: Not on file    Review of Systems   General: Negative for anorexia, weight loss, fever, chills, fatigue, weakness. Eyes: Negative for vision changes.  ENT: Negative for hoarseness, difficulty swallowing , nasal congestion. CV: Negative for   angina, palpitations, dyspnea on exertion, peripheral edema. See hpi  Respiratory: Negative for dyspnea at rest, dyspnea on exertion, cough, sputum, wheezing.  GI: See history of present illness. GU:  Negative for dysuria, hematuria, urinary incontinence, urinary frequency, nocturnal urination.  MS: Negative for joint pain, low back pain.  Derm: Negative for rash or itching.  Neuro: Negative for weakness, abnormal sensation, seizure, , memory loss,  confusion. Appt with neurology due to headaches Psych: Negative for anxiety, depression, suicidal ideation, hallucinations.  Endo:  Negative for unusual weight change.  Heme: Negative for bruising or bleeding. Allergy: Negative for rash or hives.  Physical Exam   BP 122/82 (BP Location: Right Arm, Patient Position: Sitting, Cuff Size: Large)   Pulse 73   Temp 97.9 F (36.6 C) (Temporal)   Ht 5' 7 (1.702 m)   Wt 181 lb 6.4 oz (82.3 kg)   BMI 28.41 kg/m    General: Well-nourished, well-developed in no acute distress.  Head: Normocephalic, atraumatic.   Eyes: Conjunctiva pink, no icterus. Mouth: Oropharyngeal mucosa moist and pink  Neck: Supple without thyromegaly, masses, or lymphadenopathy.  Lungs: Clear to auscultation bilaterally.  Heart: Regular rate and rhythm, no murmurs rubs or gallops.  Abdomen: Bowel sounds are normal,  nondistended, no hepatosplenomegaly or masses,  no abdominal bruits or hernia, no rebound or guarding.  Mild  luq tenderness, less noted with distraction. Rectal: not performed Extremities: No lower extremity edema. No clubbing or deformities.  Neuro: Alert and oriented x 4 , grossly normal neurologically.  Skin: Warm and dry, no rash or jaundice.   Psych: Alert and cooperative, normal mood and affect.  Labs   Labs from August 21, 2023: Troponin I was 3--> 3, white blood cell count 5.2, hemoglobin 15.5, platelets 224,000, sodium 140, potassium 3.9, glucose 105, BUN 21, creatinine 1.04  Imaging Studies   CT Angio Head Neck W WO CM Result Date: 08/21/2023 EXAM: CT HEAD WITHOUT CTA HEAD AND NECK WITH AND WITHOUT 08/21/2023 08:24:12 AM TECHNIQUE: CTA of the head and neck was performed with and without the administration of intravenous contrast. Noncontrast CT of the head with reconstructed 2-D images are also provided for review. Multiplanar 2D and/or 3D reformatted images are provided for review. Automated exposure control, iterative reconstruction, and/or weight based adjustment of the mA/kV was utilized to reduce the radiation dose to as low as reasonably achievable. COMPARISON: CT head  without and with contrast 05/31/2019. MR head without contrast 01/07/2020. CLINICAL HISTORY: Ataxia, head trauma; Neck trauma, dangerous injury mechanism (Age [age]y); dizziness, L sided pain - head and neck since fall. Reports chest pain since this am, diarrhea x 2 days, left sided headache x 1 week, pt reports falling 8 feet from ladder 2-3 weeks ago with left sided pain since. FINDINGS: CT HEAD: BRAIN AND VENTRICLES: No acute intracranial hemorrhage. No mass effect or midline shift. No extra-axial fluid collection. Gray-white differentiation is maintained. No hydrocephalus. ORBITS: No acute abnormality. SINUSES: Small polyps or mixed retention cysts are present in the left maxillary sinus and right sphenoid sinus. SOFT TISSUES AND SKULL: No acute abnormality. CTA NECK: AORTIC ARCH AND ARCH VESSELS: A common origin of the left common carotid artery and innominate artery is present, a normal variant. No dissection or arterial injury. No significant stenosis of the brachiocephalic or subclavian arteries. CERVICAL CAROTID ARTERIES: No dissection, arterial injury, or hemodynamically significant stenosis by NASCET criteria. CERVICAL VERTEBRAL ARTERIES: The right vertebral artery is the dominant vessel. No dissection, arterial injury, or significant stenosis. VISUALIZED LUNGS AND MEDIASTINUM: Unremarkable. SOFT TISSUES: No acute abnormality. BONES: Uncovertebral spurring is present bilaterally at C5-6 with moderate right and mild left foraminal stenosis. No focal osseous lesions are present. CTA HEAD: ANTERIOR CIRCULATION: No significant stenosis of the internal carotid arteries. No significant stenosis of the anterior cerebral arteries. No significant stenosis of the middle cerebral arteries. No aneurysm. POSTERIOR CIRCULATION: A fetal type right posterior cerebral artery is present. No significant stenosis of the posterior cerebral arteries. No significant stenosis of the basilar artery. No significant stenosis of the  vertebral arteries. No aneurysm. OTHER: No dural venous sinus thrombosis on this non-dedicated study. IMPRESSION: 1. No acute intracranial hemorrhage. 2. No large vessel occlusion, hemodynamically significant stenosis, or aneurysm in the head or neck. Electronically signed by: Lonni Necessary MD 08/21/2023 08:45 AM EDT RP Workstation: [id number]   DG Chest 2 View Result Date: 08/21/2023 CLINICAL DATA:  Chest pain. EXAM: CHEST - 2 VIEW COMPARISON:  03/13/2020 FINDINGS: The lungs are clear without focal pneumonia, edema, pneumothorax or pleural effusion. The cardiopericardial silhouette is within normal limits for size. No acute bony abnormality. Telemetry leads overlie the chest. IMPRESSION: No active cardiopulmonary disease. Electronically Signed   By: Camellia Candle M.D.   On: 08/21/2023 07:23    Assessment/Plan:      Acute on chronic diarrhea with increased stool frequency and  melena: similar diarrhea dating back for years previously responded nicely to antispasmotic (hyoscyamine ) but he had been doing well until recently and had not been on medication. Diarrhea starting back about two weeks after recent fall. Notes increased ibuprofen  use. Has noted dark/black stools but this could be due to Pepto use. No antibiotics since March. Previous colonoscopy in 2019 showed a small polyp that did not survive processing. -TSH/free T4, lipase, sed rate, crp, TTG IgA, IgA, CBC, CMET -colonoscopy in the near future given prior history of polyp, recent change in bowels. ASA 2.  I have discussed the risks, alternatives, benefits with regards to but not limited to the risk of reaction to medication, bleeding, infection, perforation and the patient is agreeable to proceed. Written consent to be obtained.  LUQ pain: mild tenderness noted on exam but patient did not complain of abdominal pain. ?musculoskeletal from recent fall. Evaluate at time of EGD/colonoscopy. If symptoms worsen, consider CT imaging.  ?melena:  No significant heartburn or indigestion symptoms reported. Recent NSAID use as outlined.  -CBC to check for decline in Hgb -plan for EGD in near future to evaluate possible melena, nausea, LUQ tenderness in setting of NSAID use. ASA 2.  I have discussed the risks, alternatives, benefits with regards to but not limited to the risk of reaction to medication, bleeding, infection, perforation and the patient is agreeable to proceed. Written consent to be obtained.   Colonic polyps Personal history of colonic polyps with last colonoscopy in 2019 showing a small polyp that did not survive processing. Patient requested opinion regarding use of Cologuard. Given his history of polyps and current change in bowels, Cologuard would not be advised. Colonoscopy recommended.        Sonny RAMAN. Ezzard, MHS, PA-C [facility] : [facility]

## 2023-09-06 NOTE — Patient Instructions (Signed)
 Please complete labs at [facility] lab.   We will schedule you are a colonoscopy and upper endoscopy in the near future.   If you see black stools (and you have not taken Pepto Bismol for diarrhea), please let me know.

## 2023-09-07 ENCOUNTER — Ambulatory Visit: Payer: Self-pay | Admitting: Gastroenterology

## 2023-09-08 ENCOUNTER — Other Ambulatory Visit (HOSPITAL_COMMUNITY)
Admission: RE | Admit: 2023-09-08 | Discharge: 2023-09-08 | Disposition: A | Discharge status: Home / Self Care | Payer: Self-pay | Source: Ambulatory Visit | Attending: Gastroenterology | Admitting: Gastroenterology

## 2023-09-08 DIAGNOSIS — K580 Irritable bowel syndrome with diarrhea: Secondary | ICD-10-CM | POA: Insufficient documentation

## 2023-09-08 DIAGNOSIS — Z860100 Personal history of colon polyps, unspecified: Secondary | ICD-10-CM | POA: Insufficient documentation

## 2023-09-08 DIAGNOSIS — R197 Diarrhea, unspecified: Secondary | ICD-10-CM | POA: Insufficient documentation

## 2023-09-08 DIAGNOSIS — R110 Nausea: Secondary | ICD-10-CM | POA: Insufficient documentation

## 2023-09-08 DIAGNOSIS — Z9889 Other specified postprocedural states: Secondary | ICD-10-CM | POA: Insufficient documentation

## 2023-09-08 DIAGNOSIS — K219 Gastro-esophageal reflux disease without esophagitis: Secondary | ICD-10-CM | POA: Insufficient documentation

## 2023-09-08 DIAGNOSIS — R1012 Left upper quadrant pain: Secondary | ICD-10-CM | POA: Insufficient documentation

## 2023-09-08 DIAGNOSIS — K921 Melena: Secondary | ICD-10-CM | POA: Insufficient documentation

## 2023-09-08 LAB — CBC WITH DIFFERENTIAL/PLATELET
Abs Immature Granulocytes: 0.01 K/uL (ref 0.00–0.07)
Basophils Absolute: 0 K/uL (ref 0.0–0.1)
Basophils Relative: 1 %
Eosinophils Absolute: 0.2 K/uL (ref 0.0–0.5)
Eosinophils Relative: 5 %
HCT: 44.3 % (ref 39.0–52.0)
Hemoglobin: 15 g/dL (ref 13.0–17.0)
Immature Granulocytes: 0 %
Lymphocytes Relative: 39 %
Lymphs Abs: 1.4 K/uL (ref 0.7–4.0)
MCH: 30.9 pg (ref 26.0–34.0)
MCHC: 33.9 g/dL (ref 30.0–36.0)
MCV: 91.3 fL (ref 80.0–100.0)
Monocytes Absolute: 0.4 K/uL (ref 0.1–1.0)
Monocytes Relative: 11 %
Neutro Abs: 1.6 K/uL — ABNORMAL LOW (ref 1.7–7.7)
Neutrophils Relative %: 44 %
Platelets: 216 K/uL (ref 150–400)
RBC: 4.85 MIL/uL (ref 4.22–5.81)
RDW: 12.4 % (ref 11.5–15.5)
WBC: 3.6 K/uL — ABNORMAL LOW (ref 4.0–10.5)
nRBC: 0 % (ref 0.0–0.2)

## 2023-09-08 LAB — LIPASE, BLOOD: Lipase: 38 U/L (ref 11–51)

## 2023-09-08 LAB — COMPREHENSIVE METABOLIC PANEL WITH GFR
ALT: 23 U/L (ref 0–44)
AST: 22 U/L (ref 15–41)
Albumin: 4.1 g/dL (ref 3.5–5.0)
Alkaline Phosphatase: 56 U/L (ref 38–126)
Anion gap: 10 (ref 5–15)
BUN: 16 mg/dL (ref 8–23)
CO2: 26 mmol/L (ref 22–32)
Calcium: 9.2 mg/dL (ref 8.9–10.3)
Chloride: 105 mmol/L (ref 98–111)
Creatinine, Ser: 1.06 mg/dL (ref 0.61–1.24)
GFR, Estimated: >60 mL/min (ref >60)
Glucose, Bld: 76 mg/dL (ref 70–99)
Potassium: 4.5 mmol/L (ref 3.5–5.1)
Sodium: 141 mmol/L (ref 135–145)
Total Bilirubin: 1.4 mg/dL — ABNORMAL HIGH (ref 0.0–1.2)
Total Protein: 6.7 g/dL (ref 6.5–8.1)

## 2023-09-08 LAB — SEDIMENTATION RATE: Sed Rate: 1 mm/h (ref 0–20)

## 2023-09-08 LAB — C-REACTIVE PROTEIN: CRP: <0.5 mg/dL (ref <1.0)

## 2023-09-09 LAB — THYROID PANEL WITH TSH
Free Thyroxine Index: 1.9 (ref 1.2–4.9)
T3 Uptake Ratio: 23 % — ABNORMAL LOW (ref 24–39)
T4, Total: 8.2 ug/dL (ref 4.5–12.0)
TSH: 0.815 u[IU]/mL (ref 0.450–4.500)

## 2023-09-09 LAB — IGA: IgA: 257 mg/dL (ref 61–437)

## 2023-09-10 LAB — TISSUE TRANSGLUTAMINASE, IGA: Tissue Transglutaminase Ab, IgA: <2 U/mL (ref 0–3)

## 2023-09-14 MED ORDER — PEG 3350-KCL-NA BICARB-NACL 420 G PO SOLR: 4000.0000 mL | Freq: Once | ORAL | 0 refills | Status: AC

## 2023-09-14 NOTE — Telephone Encounter (Signed)
 Spoke with pt. He has been scheduled for TCS/EGD with Dr. Shaaron 9/22. Aware will mail instructions and will send rx for prep to pharmacy.

## 2023-09-23 ENCOUNTER — Ambulatory Visit: Payer: Self-pay | Admitting: Gastroenterology

## 2023-10-10 ENCOUNTER — Ambulatory Visit: Payer: Self-pay | Admitting: Neurology

## 2023-10-17 ENCOUNTER — Encounter (HOSPITAL_COMMUNITY)
Admission: RE | Disposition: A | Discharge status: Home / Self Care | Payer: Self-pay | Source: Home / Self Care | Attending: Internal Medicine

## 2023-10-17 ENCOUNTER — Ambulatory Visit (HOSPITAL_COMMUNITY): Payer: Self-pay | Admitting: Anesthesiology

## 2023-10-17 ENCOUNTER — Other Ambulatory Visit: Payer: Self-pay

## 2023-10-17 ENCOUNTER — Ambulatory Visit (HOSPITAL_COMMUNITY)
Admission: RE | Admit: 2023-10-17 | Discharge: 2023-10-17 | Disposition: A | Discharge status: Home / Self Care | Payer: Self-pay | Attending: Internal Medicine | Admitting: Internal Medicine

## 2023-10-17 ENCOUNTER — Encounter (HOSPITAL_COMMUNITY): Payer: Self-pay | Admitting: Internal Medicine

## 2023-10-17 DIAGNOSIS — K921 Melena: Secondary | ICD-10-CM | POA: Insufficient documentation

## 2023-10-17 DIAGNOSIS — K219 Gastro-esophageal reflux disease without esophagitis: Secondary | ICD-10-CM

## 2023-10-17 DIAGNOSIS — R519 Headache, unspecified: Secondary | ICD-10-CM | POA: Insufficient documentation

## 2023-10-17 DIAGNOSIS — I10 Essential (primary) hypertension: Secondary | ICD-10-CM | POA: Insufficient documentation

## 2023-10-17 DIAGNOSIS — R109 Unspecified abdominal pain: Secondary | ICD-10-CM | POA: Insufficient documentation

## 2023-10-17 DIAGNOSIS — K529 Noninfective gastroenteritis and colitis, unspecified: Secondary | ICD-10-CM | POA: Insufficient documentation

## 2023-10-17 DIAGNOSIS — Z79899 Other long term (current) drug therapy: Secondary | ICD-10-CM | POA: Insufficient documentation

## 2023-10-17 HISTORY — PX: ESOPHAGOGASTRODUODENOSCOPY: SHX5428

## 2023-10-17 HISTORY — PX: COLONOSCOPY: SHX5424

## 2023-10-17 SURGERY — COLONOSCOPY
Anesthesia: General

## 2023-10-17 MED ORDER — LIDOCAINE 2% (20 MG/ML) 5 ML SYRINGE
INTRAMUSCULAR | Status: DC | PRN
  Administered 2023-10-17: 60 mg via INTRAVENOUS

## 2023-10-17 MED ORDER — LACTATED RINGERS IV SOLN: INTRAVENOUS | Status: DC

## 2023-10-17 MED ORDER — STERILE WATER FOR IRRIGATION IR SOLN
Status: DC | PRN
  Administered 2023-10-17: 50 mL

## 2023-10-17 MED ORDER — PROPOFOL 10 MG/ML IV BOLUS
INTRAVENOUS | Status: DC | PRN
  Administered 2023-10-17: 125 ug/kg/min via INTRAVENOUS
  Administered 2023-10-17: 70 mg via INTRAVENOUS
  Administered 2023-10-17: 40 mg via INTRAVENOUS

## 2023-10-17 MED ORDER — SIMETHICONE 40 MG/0.6ML PO SUSP
ORAL | Status: AC
  Filled 2023-10-17: qty 30

## 2023-10-17 MED ORDER — SIMETHICONE 40 MG/0.6ML PO SUSP
40.0000 mg | Freq: Once | ORAL | Status: AC
  Administered 2023-10-17: 40 mg via ORAL

## 2023-10-17 NOTE — Anesthesia Preprocedure Evaluation (Signed)
 Anesthesia Evaluation  Patient identified by MRN, date of birth, ID band Patient awake    Reviewed: Allergy & Precautions, H&P , NPO status , Patient's Chart, lab work & pertinent test results, reviewed documented beta blocker date and time   Airway Mallampati: II  TM Distance: >3 FB Neck ROM: full    Dental no notable dental hx.    Pulmonary neg pulmonary ROS   Pulmonary exam normal breath sounds clear to auscultation       Cardiovascular Exercise Tolerance: Good hypertension, negative cardio ROS  Rhythm:regular Rate:Normal     Neuro/Psych  Headaches  negative psych ROS   GI/Hepatic Neg liver ROS,GERD  ,,  Endo/Other  negative endocrine ROS    Renal/GU negative Renal ROS  negative genitourinary   Musculoskeletal   Abdominal   Peds  Hematology negative hematology ROS (+)   Anesthesia Other Findings   Reproductive/Obstetrics negative OB ROS                              Anesthesia Physical Anesthesia Plan  ASA: 2  Anesthesia Plan: General   Post-op Pain Management:    Induction:   PONV Risk Score and Plan: Propofol  infusion  Airway Management Planned:   Additional Equipment:   Intra-op Plan:   Post-operative Plan:   Informed Consent: I have reviewed the patients History and Physical, chart, labs and discussed the procedure including the risks, benefits and alternatives for the proposed anesthesia with the patient or authorized representative who has indicated his/her understanding and acceptance.     Dental Advisory Given  Plan Discussed with: CRNA  Anesthesia Plan Comments:         Anesthesia Quick Evaluation

## 2023-10-17 NOTE — Discharge Instructions (Addendum)
 EGD Discharge instructions Please read the instructions outlined below and refer to this sheet in the next few weeks. These discharge instructions provide you with general information on caring for yourself after you leave the hospital. Your doctor may also give you specific instructions. While your treatment has been planned according to the most current medical practices available, unavoidable complications occasionally occur. If you have any problems or questions after discharge, please call your doctor. ACTIVITY You may resume your regular activity but move at a slower pace for the next 24 hours.  Take frequent rest periods for the next 24 hours.  Walking will help expel (get rid of) the air and reduce the bloated feeling in your abdomen.  No driving for 24 hours (because of the anesthesia (medicine) used during the test).  You may shower.  Do not sign any important legal documents or operate any machinery for 24 hours (because of the anesthesia used during the test).  NUTRITION Drink plenty of fluids.  You may resume your normal diet.  Begin with a light meal and progress to your normal diet.  Avoid alcoholic beverages for 24 hours or as instructed by your caregiver.  MEDICATIONS You may resume your normal medications unless your caregiver tells you otherwise.  WHAT YOU CAN EXPECT TODAY You may experience abdominal discomfort such as a feeling of fullness or "gas" pains.  FOLLOW-UP Your doctor will discuss the results of your test with you.  SEEK IMMEDIATE MEDICAL ATTENTION IF ANY OF THE FOLLOWING OCCUR: Excessive nausea (feeling sick to your stomach) and/or vomiting.  Severe abdominal pain and distention (swelling).  Trouble swallowing.  Temperature over 101 F (37.8 C).  Rectal bleeding or vomiting of blood.    Colonoscopy Discharge Instructions  Read the instructions outlined below and refer to this sheet in the next few weeks. These discharge instructions provide you with  general information on caring for yourself after you leave the hospital. Your doctor may also give you specific instructions. While your treatment has been planned according to the most current medical practices available, unavoidable complications occasionally occur. If you have any problems or questions after discharge, call Dr. Shaaron at [phone]. ACTIVITY You may resume your regular activity, but move at a slower pace for the next 24 hours.  Take frequent rest periods for the next 24 hours.  Walking will help get rid of the air and reduce the bloated feeling in your belly (abdomen).  No driving for 24 hours (because of the medicine (anesthesia) used during the test).   Do not sign any important legal documents or operate any machinery for 24 hours (because of the anesthesia used during the test).  NUTRITION Drink plenty of fluids.  You may resume your normal diet as instructed by your doctor.  Begin with a light meal and progress to your normal diet. Heavy or fried foods are harder to digest and may make you feel sick to your stomach (nauseated).  Avoid alcoholic beverages for 24 hours or as instructed.  MEDICATIONS You may resume your normal medications unless your doctor tells you otherwise.  WHAT YOU CAN EXPECT TODAY Some feelings of bloating in the abdomen.  Passage of more gas than usual.  Spotting of blood in your stool or on the toilet paper.  IF YOU HAD POLYPS REMOVED DURING THE COLONOSCOPY: No aspirin products for 7 days or as instructed.  No alcohol for 7 days or as instructed.  Eat a soft diet for the next 24 hours.  FINDING  OUT THE RESULTS OF YOUR TEST Not all test results are available during your visit. If your test results are not back during the visit, make an appointment with your caregiver to find out the results. Do not assume everything is normal if you have not heard from your caregiver or the medical facility. It is important for you to follow up on all of your test  results.  SEEK IMMEDIATE MEDICAL ATTENTION IF: You have more than a spotting of blood in your stool.  Your belly is swollen (abdominal distention).  You are nauseated or vomiting.  You have a temperature over 101.  You have abdominal pain or discomfort that is severe or gets worse throughout the day.    Your upper GI tract appeared normal  Your colon also appeared normal.  Biopsies of your colon taken   office visit with us  in 4 weeks  Further recommendations to follow in the near future once lab reports are available for review   he should have a repeat colonoscopy in 10 years for screening purposes

## 2023-10-17 NOTE — Transfer of Care (Addendum)
 Immediate Anesthesia Transfer of Care Note  Patient: Logan Bush  Procedure(s) Performed: COLONOSCOPY EGD (ESOPHAGOGASTRODUODENOSCOPY)  Patient Location: Endoscopy Unit  Anesthesia Type:General  Level of Consciousness: drowsy and patient cooperative  Airway & Oxygen Therapy: Patient Spontanous Breathing  Post-op Assessment: Report given to RN and Post -op Vital signs reviewed and stable  Post vital signs: Reviewed and stable  Last Vitals:  Vitals Value Taken Time  BP 95/57 10/17/23   1248  Temp 36.7 10/17/23   1248  Pulse 57 10/17/23   1248  Resp 20 10/17/23   1248  SpO2 97% 10/17/23   1248    Last Pain:  Vitals:   10/17/23 1223  TempSrc:   PainSc: 0-No pain      Patients Stated Pain Goal: 3 (10/17/23 1138)  Complications: No notable events documented.

## 2023-10-17 NOTE — H&P (Signed)
 @LOGO @   Gastroenterology Progress Note    Primary Care Physician:  Carol Catalan, PA-C Primary Gastroenterologist:  Dr. Shaaron  Pre-Procedure History & Physical: HPI:  Logan Bush is a 63 y.o. male here for further evaluation of melena left side abdominal pain chronic diarrhea via EGD and colonoscopy.  Recent NSAID use.  Denies dysphagia.  Past Medical History:  Diagnosis Date   Allergy    DVT (deep venous thrombosis) (HCC)    Palpitation    Vertigo     Past Surgical History:  Procedure Laterality Date   COLONOSCOPY N/A 08/05/2017   Procedure: COLONOSCOPY;  Surgeon: Shaaron Lamar HERO, MD;  Location: [facility];  Service: Endoscopy;  Laterality: N/A;  1:45pm-rescheduled to 7/12 @9 :30am per [place]   POLYPECTOMY  10/2012   POLYPECTOMY  08/05/2017   Procedure: POLYPECTOMY;  Surgeon: Shaaron Lamar HERO, MD;  Location: [facility];  Service: Endoscopy;;    Prior to Admission medications   Medication Sig Start Date End Date Taking? Authorizing Provider  cetirizine (ZYRTEC) 5 MG chewable tablet Chew 5 mg by mouth daily.   Yes [provider]    Allergies as of 09/14/2023   (No Known Allergies)    Family History  Problem Relation Age of Onset   Heart attack Mother    Hypertension Mother    Cancer Father        prostate cancer   Asthma Father    Cancer Sister        breast cancer   Cancer Maternal Grandmother        Breast Cancer   Colon cancer Neg Hx    Colon polyps Neg Hx     Social History   Socioeconomic History   Marital status: Married    Spouse name: Not on file   Number of children: 3   Years of education: Not on file   Highest education level: Not on file  Occupational History   Not on file  Tobacco Use   Smoking status: Never    Passive exposure: Yes   Smokeless tobacco: Never  Vaping Use   Vaping status: Never Used  Substance and Sexual Activity   Alcohol use: No   Drug use: No   Sexual activity: Not on file  Other Topics Concern    Not on file  Social History Narrative   ** Merged History Encounter **       Right handed   Social Drivers of Health   Financial Resource Strain: Not on file  Food Insecurity: Not on file  Transportation Needs: Not on file  Physical Activity: Not on file  Stress: Not on file  Social Connections: Not on file  Intimate Partner Violence: Not on file    Review of Systems   See HPI, otherwise negative ROS  Physical Exam: BP 138/81   Pulse 60   Temp 97.7 F (36.5 C) (Oral)   Resp 13   Ht 5' 8 (1.727 m)   Wt 79.4 kg   SpO2 98%   BMI 26.61 kg/m  General:   Alert,  Well-developed, well-nourished, pleasant and cooperative in NAD Neck:  Supple; no masses or thyromegaly. No significant cervical adenopathy. Lungs:  Clear throughout to auscultation.   No wheezes, crackles, or rhonchi. No acute distress. Heart:  Regular rate and rhythm; no murmurs, clicks, rubs,  or gallops. Abdomen: Non-distended, normal bowel sounds.  Soft and nontender without appreciable mass or hepatosplenomegaly.   Impression/Plan:   63 year old gentleman recent black stool  diarrhea and left-sided abdominal pain NSAID use after a fall.  Denies dysphagia.  History of colonic polyp.  EGD and colonoscopy now being done.  Denies dysphagia.  Risk benefits limitations alternatives have been reviewed questions answered he is agreeable.    Notice: This dictation was prepared with Dragon dictation along with smaller phrase technology. Any transcriptional errors that result from this process are unintentional and may not be corrected upon review.

## 2023-10-17 NOTE — Op Note (Signed)
 [facility] Patient Name: Logan Bush Procedure Date: 10/17/2023 11:27 AM MRN: [id number] Date of Birth: 1961/01/11 Attending MD: Lamar Ozell Hollingshead , MD, [id number] CSN: [id number] Age: 63 Admit Type: Outpatient Procedure:                Upper GI endoscopy Indications:              Melena Providers:                Lamar Ozell Hollingshead, MD, Jon LABOR. Gerome RN, RN,                            Bascom Blush Referring MD:              Medicines:                Propofol  per Anesthesia Complications:            No immediate complications. Estimated Blood Loss:     Estimated blood loss: none. Procedure:                Pre-Anesthesia Assessment:                           - Prior to the procedure, a History and Physical                            was performed, and patient medications and                            allergies were reviewed. The patient's tolerance of                            previous anesthesia was also reviewed. The risks                            and benefits of the procedure and the sedation                            options and risks were discussed with the patient.                            All questions were answered, and informed consent                            was obtained. Prior Anticoagulants: The patient has                            taken no anticoagulant or antiplatelet agents. ASA                            Grade Assessment: II - A patient with mild systemic                            disease. After reviewing the risks and benefits,  the patient was deemed in satisfactory condition to                            undergo the procedure.                           After obtaining informed consent, the endoscope was                            passed under direct vision. Throughout the                            procedure, the patient's blood pressure, pulse, and                            oxygen saturations were monitored  continuously. The                            [id number] ([id number]) Upper was introduced through                            the mouth, and advanced to the second part of                            duodenum. The upper GI endoscopy was accomplished                            without difficulty. The patient tolerated the                            procedure well. Scope In: 12:26:17 PM Scope Out: 12:29:04 PM Total Procedure Duration: 0 hours 2 minutes 47 seconds  Findings:      The examined esophagus was normal.      The entire examined stomach was normal.      The duodenal bulb and second portion of the duodenum were normal.       Estimated blood loss: none. Impression:               - Normal esophagus.                           - Normal stomach.                           - Normal duodenal bulb and second portion of the                            duodenum.                           - No specimens collected. Patient may have                            experienced NSAID insult earlier this year which is  healed. Last hemoglobin good at 15. Moderate Sedation:      Moderate (conscious) sedation was personally administered by an       anesthesia professional. The following parameters were monitored: oxygen       saturation, heart rate, blood pressure, respiratory rate, EKG, adequacy       of pulmonary ventilation, and response to care. Recommendation:           - Patient has a contact number available for                            emergencies. The signs and symptoms of potential                            delayed complications were discussed with the                            patient. Return to normal activities tomorrow.                            Written discharge instructions were provided to the                            patient.                           - Advance diet as tolerated. Continue present                            medicines but avoid NSAIDs is much  as possible                            moving forward. See colonoscopy report. Procedure Code(s):        --- Professional ---                           [phone], Esophagogastroduodenoscopy, flexible,                            transoral; diagnostic, including collection of                            specimen(s) by brushing or washing, when performed                            (separate procedure) Diagnosis Code(s):        --- Professional ---                           K92.1, Melena (includes Hematochezia) CPT copyright 2022 [employer]. All rights reserved. The codes documented in this report are preliminary and upon coder review may  be revised to meet current compliance requirements. Lamar HERO. Desiree Fleming, MD Lamar Ozell Hollingshead, MD 10/17/2023 12:45:58 PM This report has been signed electronically. Number of Addenda: 0

## 2023-10-17 NOTE — Progress Notes (Signed)
 Patient sat up on side of bed and began complaining of shortness of breath and what was described as chest pain. Patient was hooked back up to monitor and vitals were obtained. Dr.Kiel was notified and arrived to assess patient. Patient vital signs stable and EKG was obtained. Patient then complained of what he described as gas pain. Dr.Rourk was notified and a dose of simethicone  was ordered and given. Dr.Kiel returned and assessed patient. Patient reported relief at this time and was discharged.

## 2023-10-17 NOTE — Anesthesia Procedure Notes (Signed)
 Date/Time: 10/17/2023 12:23 PM  Performed by: Para Jerelene CROME, CRNAOxygen Delivery Method: Nasal cannula Comments: OptiFlow Nasal Cannula.

## 2023-10-17 NOTE — Op Note (Signed)
 [facility] Patient Name: Logan Bush Procedure Date: 10/17/2023 11:24 AM MRN: [id number] Date of Birth: Jan 05, 1961 Attending MD: Lamar Ozell Hollingshead , MD, [id number] CSN: [id number] Age: 63 Admit Type: Outpatient Procedure:                Colonoscopy Indications:              Chronic diarrhea Providers:                Lamar Ozell Hollingshead, MD, Jon LABOR. Gerome RN, RN,                            Bascom Blush Referring MD:              Medicines:                Propofol  per Anesthesia Complications:            No immediate complications. Estimated Blood Loss:     Estimated blood loss was minimal. Procedure:                Pre-Anesthesia Assessment:                           - Prior to the procedure, a History and Physical                            was performed, and patient medications and                            allergies were reviewed. The patient's tolerance of                            previous anesthesia was also reviewed. The risks                            and benefits of the procedure and the sedation                            options and risks were discussed with the patient.                            All questions were answered, and informed consent                            was obtained. Prior Anticoagulants: The patient has                            taken no anticoagulant or antiplatelet agents. ASA                            Grade Assessment: II - A patient with mild systemic                            disease. After reviewing the risks and benefits,  the patient was deemed in satisfactory condition to                            undergo the procedure.                           After obtaining informed consent, the colonoscope                            was passed under direct vision. Throughout the                            procedure, the patient's blood pressure, pulse, and                            oxygen saturations were  monitored continuously. The                            [id number] ([id number]) Colon was introduced through                            the anus and advanced to the 5 cm into the ileum.                            The colonoscopy was performed without difficulty.                            The patient tolerated the procedure well. The                            quality of the bowel preparation was adequate. The                            terminal ileum, ileocecal valve, appendiceal                            orifice, and rectum were photographed. The                            colonoscopy was performed without difficulty. The                            patient tolerated the procedure well. The quality                            of the bowel preparation was adequate. Scope In: 12:34:07 PM Scope Out: 12:43:22 PM Scope Withdrawal Time: 0 hours 7 minutes 3 seconds  Total Procedure Duration: 0 hours 9 minutes 15 seconds  Findings:      The perianal and digital rectal examinations were normal.      The colon (entire examined portion) appeared normal. Distal 5 cm of TI       appeared normal.      The retroflexed view of the distal rectum and anal verge was normal and  showed no anal or rectal abnormalities. Segment of biopsies of the right       and left colon taken for histologic study. Impression:               - The entire examined colon is normal.                            Normal-appearing TI.                           - The distal rectum and anal verge are normal on                            retroflexion view.                           - Status post segmental colonic biopsy. Moderate Sedation:      Moderate (conscious) sedation was personally administered by an       anesthesia professional. The following parameters were monitored: oxygen       saturation, heart rate, blood pressure, respiratory rate, EKG, adequacy       of pulmonary ventilation, and response to care. Recommendation:            - Patient has a contact number available for                            emergencies. The signs and symptoms of potential                            delayed complications were discussed with the                            patient. Return to normal activities tomorrow.                            Written discharge instructions were provided to the                            patient.                           - Advance diet as tolerated.                           - Repeat colonoscopy in 10 years for screening                            purposes. Follow-up on pathology.                           - Return to GI office in 4 weeks. See EGD report. Procedure Code(s):        --- Professional ---                           [phone], Colonoscopy, flexible; diagnostic, including  collection of specimen(s) by brushing or washing,                            when performed (separate procedure) Diagnosis Code(s):        --- Professional ---                           K52.9, Noninfective gastroenteritis and colitis,                            unspecified CPT copyright 2022 [employer]. All rights reserved. The codes documented in this report are preliminary and upon coder review may  be revised to meet current compliance requirements. Lamar HERO. Verland Sprinkle, MD Lamar Ozell Hollingshead, MD 10/17/2023 12:49:25 PM This report has been signed electronically. Number of Addenda: 0

## 2023-10-18 ENCOUNTER — Encounter (HOSPITAL_COMMUNITY): Payer: Self-pay | Admitting: Internal Medicine

## 2023-10-19 LAB — SURGICAL PATHOLOGY

## 2023-10-20 ENCOUNTER — Ambulatory Visit: Payer: Self-pay | Admitting: Internal Medicine

## 2023-10-20 NOTE — Anesthesia Postprocedure Evaluation (Signed)
 Anesthesia Post Note  Patient: Logan Bush  Procedure(s) Performed: COLONOSCOPY EGD (ESOPHAGOGASTRODUODENOSCOPY)  Patient location during evaluation: Phase II Anesthesia Type: General Level of consciousness: awake Pain management: pain level controlled Vital Signs Assessment: post-procedure vital signs reviewed and stable Respiratory status: spontaneous breathing and respiratory function stable Cardiovascular status: blood pressure returned to baseline and stable Postop Assessment: no headache and no apparent nausea or vomiting Anesthetic complications: no Comments: Late entry   No notable events documented.   Last Vitals:  Vitals:   10/17/23 1309 10/17/23 1325  BP: (!) 119/98 126/73  Pulse: 61 (!) 52  Resp: 15 12  Temp:    SpO2: 100% 100%    Last Pain:  Vitals:   10/17/23 1325  TempSrc:   PainSc: 0-No pain                 Yvonna JINNY Bosworth

## 2023-11-02 ENCOUNTER — Other Ambulatory Visit (HOSPITAL_BASED_OUTPATIENT_CLINIC_OR_DEPARTMENT_OTHER): Payer: Self-pay

## 2023-11-14 ENCOUNTER — Ambulatory Visit (INDEPENDENT_AMBULATORY_CARE_PROVIDER_SITE_OTHER): Payer: Self-pay

## 2023-11-14 DIAGNOSIS — Z23 Encounter for immunization: Secondary | ICD-10-CM

## 2023-11-14 NOTE — Progress Notes (Signed)
 Patient is in office today for a nurse visit for Immunization.flu vaccine Patient Injection was given in the  Left deltoid. Patient tolerated injection well.

## 2023-11-28 ENCOUNTER — Ambulatory Visit: Payer: Self-pay | Admitting: Gastroenterology

## 2024-01-10 ENCOUNTER — Ambulatory Visit: Payer: Self-pay | Admitting: Gastroenterology

## 2024-01-17 ENCOUNTER — Ambulatory Visit: Payer: Self-pay | Admitting: Neurology

## 2024-04-30 ENCOUNTER — Ambulatory Visit: Payer: Self-pay | Admitting: Neurology
